# Patient Record
Sex: Female | Born: 1985
Health system: Southern US, Community
[De-identification: ages and names within clinical notes are randomized; demographics above are authoritative.]

## PROBLEM LIST (undated history)

## (undated) ENCOUNTER — Emergency Department (HOSPITAL_COMMUNITY): Admission: EM | Payer: BLUE CROSS/BLUE SHIELD | Source: Home / Self Care

## (undated) DIAGNOSIS — K2980 Duodenitis without bleeding: Secondary | ICD-10-CM

## (undated) DIAGNOSIS — I7300 Raynaud's syndrome without gangrene: Secondary | ICD-10-CM

## (undated) DIAGNOSIS — E282 Polycystic ovarian syndrome: Secondary | ICD-10-CM

## (undated) DIAGNOSIS — R079 Chest pain, unspecified: Secondary | ICD-10-CM

## (undated) DIAGNOSIS — N739 Female pelvic inflammatory disease, unspecified: Secondary | ICD-10-CM

## (undated) DIAGNOSIS — T8859XA Other complications of anesthesia, initial encounter: Secondary | ICD-10-CM

## (undated) DIAGNOSIS — R519 Headache, unspecified: Secondary | ICD-10-CM

## (undated) DIAGNOSIS — Z87442 Personal history of urinary calculi: Secondary | ICD-10-CM

## (undated) DIAGNOSIS — K219 Gastro-esophageal reflux disease without esophagitis: Secondary | ICD-10-CM

## (undated) DIAGNOSIS — L501 Idiopathic urticaria: Secondary | ICD-10-CM

## (undated) DIAGNOSIS — Z9889 Other specified postprocedural states: Secondary | ICD-10-CM

## (undated) DIAGNOSIS — L503 Dermatographic urticaria: Secondary | ICD-10-CM

## (undated) DIAGNOSIS — M26609 Unspecified temporomandibular joint disorder, unspecified side: Secondary | ICD-10-CM

## (undated) DIAGNOSIS — K56609 Unspecified intestinal obstruction, unspecified as to partial versus complete obstruction: Secondary | ICD-10-CM

## (undated) DIAGNOSIS — T4145XA Adverse effect of unspecified anesthetic, initial encounter: Secondary | ICD-10-CM

## (undated) DIAGNOSIS — F909 Attention-deficit hyperactivity disorder, unspecified type: Secondary | ICD-10-CM

## (undated) DIAGNOSIS — R002 Palpitations: Secondary | ICD-10-CM

## (undated) DIAGNOSIS — M1990 Unspecified osteoarthritis, unspecified site: Secondary | ICD-10-CM

## (undated) DIAGNOSIS — F419 Anxiety disorder, unspecified: Secondary | ICD-10-CM

## (undated) DIAGNOSIS — R112 Nausea with vomiting, unspecified: Secondary | ICD-10-CM

## (undated) DIAGNOSIS — T7840XA Allergy, unspecified, initial encounter: Secondary | ICD-10-CM

## (undated) DIAGNOSIS — K639 Disease of intestine, unspecified: Secondary | ICD-10-CM

## (undated) DIAGNOSIS — J189 Pneumonia, unspecified organism: Secondary | ICD-10-CM

## (undated) DIAGNOSIS — B009 Herpesviral infection, unspecified: Secondary | ICD-10-CM

## (undated) DIAGNOSIS — B449 Aspergillosis, unspecified: Secondary | ICD-10-CM

## (undated) HISTORY — PX: NASAL SINUS SURGERY: SHX719

## (undated) HISTORY — DX: Allergy, unspecified, initial encounter: T78.40XA

## (undated) HISTORY — DX: Idiopathic urticaria: L50.1

## (undated) HISTORY — DX: Aspergillosis, unspecified: B44.9

## (undated) HISTORY — DX: Dermatographic urticaria: L50.3

## (undated) HISTORY — DX: Anxiety disorder, unspecified: F41.9

## (undated) HISTORY — DX: Duodenitis without bleeding: K29.80

## (undated) HISTORY — PX: ESOPHAGOGASTRODUODENOSCOPY: SHX1529

## (undated) HISTORY — PX: APPENDECTOMY: SHX54

## (undated) HISTORY — PX: WISDOM TOOTH EXTRACTION: SHX21

## (undated) HISTORY — PX: CERVICAL CERCLAGE: SHX1329

---
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
## 2007-05-20 ENCOUNTER — Emergency Department (HOSPITAL_COMMUNITY): Admission: EM | Admit: 2007-05-20 | Discharge: 2007-05-20 | Payer: Self-pay | Admitting: Emergency Medicine

## 2007-06-02 ENCOUNTER — Ambulatory Visit (HOSPITAL_COMMUNITY): Admission: RE | Admit: 2007-06-02 | Discharge: 2007-06-02 | Payer: Self-pay | Admitting: Obstetrics and Gynecology

## 2007-06-03 ENCOUNTER — Inpatient Hospital Stay (HOSPITAL_COMMUNITY): Admission: AD | Admit: 2007-06-03 | Discharge: 2007-06-08 | Payer: Self-pay | Admitting: Obstetrics and Gynecology

## 2007-07-29 ENCOUNTER — Inpatient Hospital Stay (HOSPITAL_COMMUNITY): Admission: AD | Admit: 2007-07-29 | Discharge: 2007-08-23 | Payer: Self-pay | Admitting: Obstetrics and Gynecology

## 2007-08-22 ENCOUNTER — Encounter (INDEPENDENT_AMBULATORY_CARE_PROVIDER_SITE_OTHER): Payer: Self-pay | Admitting: Obstetrics and Gynecology

## 2007-08-24 ENCOUNTER — Encounter (HOSPITAL_COMMUNITY): Admission: RE | Admit: 2007-08-24 | Discharge: 2007-09-16 | Payer: Self-pay | Admitting: Obstetrics and Gynecology

## 2011-02-05 NOTE — H&P (Signed)
NAMEGIANNINA, [name]              ACCOUNT NO.:  [id number]   MEDICAL RECORD NO.:  [id number]          PATIENT TYPE:  INP   LOCATION:  9399                          FACILITY:  [facility]   PHYSICIAN:  Guy Sandifer. Henderson Cloud, M.D. DATE OF BIRTH:  09-30-1985   DATE OF ADMISSION:  06/03/2007  DATE OF DISCHARGE:                              HISTORY & PHYSICAL   CHIEF COMPLAINT:  Incompetent cervix.   HISTORY OF PRESENT ILLNESS:  The patient is a 25 year old single white  female, G1, P0, who had ultrasound at [facility] on May 20, 2007, confirming an intrauterine pregnancy at 16-2/7 weeks.  Cervical  measurement at that time was 3 cm.  The patient had tonsillectomy and  antibiotic exposures early in pregnancy.  She was referred to Dr. Ander Slade  for consultation and evaluation.  Ultrasound in his office on June 02, 2007, revealed normal anatomy.  However, the cervix measured 0.49 cm.  Today in the office repeat ultrasound confirms funneling of the cervix  with a measurable cervical length of 4 mm.  The patient denies bleeding,  leaking, pelvic pressure.  She has had a productive cough recently.  After discussion with the patient and her mother, she is admitted to  [facility].   PAST MEDICAL HISTORY:  1. Seizure disorder.  She reports a normal MRI and a question as to      the true cause of this.  Her last seizure-like episode was in      March.  2. Tonsillar abscess, status post tonsillectomy under general      anesthesia on February 26, 2007.  3. Levaquin exposure early in pregnancy, status post perinatal      consultation with normal anatomy on ultrasound.   SURGICAL HISTORY:  Tonsillectomy and adenoidectomy as above and knee  surgery.   SOCIAL HISTORY:  Denies tobacco, alcohol or drug abuse.   ALLERGIES:  BIAXIN, which causes her throat to swell; OXYCODONE causes  nausea and vomiting.  She has had rash before with different  PENICILLINS; however, she has tolerated  Keflex with no problems.   FAMILY HISTORY:  Polycystic ovarian syndrome and irritable bowel  syndrome.   REVIEW OF SYSTEMS:  NEUROLOGIC:  See above.  CARDIAC:  Denies chest  pain.  PULMONARY:  Denies shortness of breath.  She does complain of a  productive cough recently.  GI:  Denies recent changes in bowel habits.   PHYSICAL EXAMINATION:  Height 5 feet 4 inches, weight 157 pounds.  Blood  pressure 100/60.  LUNGS:  Clear to auscultation.  HEART:  Regular rate and rhythm.  BREASTS:  Exam deferred.  ABDOMEN:  Nontender.  PELVIC:  Deferred.  EXTREMITIES:  Grossly within normal limits.  NEUROLOGIC:  Grossly within normal limits.   ASSESSMENT:  Cervical incompetence on ultrasound.   PLAN:  Will admit for strict bed rest in Trendelenburg position, IV  fluids, IV antibiotics, with a possible cerclage tomorrow.  Risks and  benefits have been discussed with the patient and her mother.      Guy Sandifer Henderson Cloud, M.D.  Electronically Signed  JET/MEDQ  D:  06/03/2007  T:  06/03/2007  Job:  [id number]

## 2011-02-05 NOTE — Op Note (Signed)
[name], [name]              ACCOUNT NO.:  [id number]   MEDICAL RECORD NO.:  [id number]          PATIENT TYPE:  INP   LOCATION:  9309                          [facility]ILITY:  [facility]   PHYSICIAN:  Guy Sandifer. Henderson Cloud, M.D. DATE OF BIRTH:  1986-04-20   DATE OF PROCEDURE:  06/04/2007  DATE OF DISCHARGE:                               OPERATIVE REPORT   PREOPERATIVE DIAGNOSES:  1. Intrauterine pregnancy at 18-2/7 weeks' estimated additional age.  2. Incompetent cervix.   POSTOPERATIVE DIAGNOSES:  1. Intrauterine pregnancy at 18-2/7 weeks' estimated additional age.  2. Incompetent cervix.   PROCEDURE:  McDonald cervical cerclage.   SURGEON:  Guy Sandifer. Henderson Cloud, M.D.   ANESTHESIA:  Spinal, Cristela Blue, MD   ESTIMATED BLOOD LOSS:  Drops.   INDICATIONS AND CONSENT:  This patient is a 25 year old single white  female, G1 P0, who was diagnosed with pregnancy at 16-2/7 weeks with  ultrasound at [facility], where she complained of abdominal  pains.  Cervix at that time measured 3 cm.  The patient had undergone  tonsillectomy for a tonsillar abscess in the first trimester before she  knew she was pregnant.  She was referred to Dr. Ander Slade for consultation.  Ultrasound there revealed normal fetal anatomy.  However, the cervix had  4.9 mm of cervical length at that time.  The following day she was seen  in my office, where again funneling of the cervix was noted with 4 mm of  measurable cervix.  The patient was admitted to hospital immediately for  Trendelenburg, IV fluids, IV antibiotics.  The following day she is  taken to the operating room.  Cervical cerclage has been discussed with  the patient and her mother.  Potential risks and complications have been  reviewed preoperatively including but not limited to infection,  intrauterine infection, miscarriage, and possible delivery prior to  viability, and organ damage.  All questions were answered and consent is  signed on the chart.   PROCEDURE:  The patient is taken to the operating room where she is  identified, spinal anesthetic is placed, and she is placed in the dorsal  lithotomy position.  She is then gently prepped, bladder straight-  catheterized, and she is draped in a sterile fashion.  Examination  reveals the external cervical os to be dilated to 1 cm.  Examination  with the speculum reveals approximately 1 cm dilation of the external  cervical os with the membranes visualized behind that external cervical  os.  Novofil #1 was then used to put a single pursestring McDonald  suture starting at the 12 o'clock  position and ending at the 1 o'clock position.  The suture is tied down.  Good hemostasis is attained.  A second air knot is placed for easy  location of the cervix later on.  Instruments are removed and all counts  are correct.  The patient is taken to the recovery room in stable  condition.      Guy Sandifer Henderson Cloud, M.D.  Electronically Signed     JET/MEDQ  D:  06/04/2007  T:  06/04/2007  Job:  [id number] 

## 2011-02-08 NOTE — Discharge Summary (Signed)
[name], [name]              ACCOUNT NO.:  [id number]   MEDICAL RECORD NO.:  [id number]          PATIENT TYPE:  INP   LOCATION:  9319                          [facility]ILITY[facility]  [facility]   PHYSICIAN:  Duke Salvia. Marcelle Overlie, M.D.DATE OF BIRTH:  Feb 15, 1986   DATE OF ADMISSION:  07/29/2007  DATE OF DISCHARGE:  08/23/2007                               DISCHARGE SUMMARY   DIAGNOSES:  1. Intrauterine pregnancy at 26-2/7 weeks estimated gestational age.  2. Incompetent cervix status post cervical cerclage.  3. Preterm labor.   DISCHARGE DIAGNOSES:  1. Status post spontaneous vaginal delivery.  2. Viable female infant.   PROCEDURE:  Spontaneous vaginal delivery.   REASON FOR ADMISSION:  Please see written H&P.   HOSPITAL COURSE:  The patient is a 21-year primigravida that was  admitted to [facility] at 26-2/7 weeks estimated  gestational age for complaints of low abdominal pain.  The patient had  had previous cervical cerclage for incompetent cervix which had been  placed in September of this year.  The patient had been on bedrest with  last office visit revealing a cervical length measuring at 9 mm.  On  admission vital signs were stable.  Fetal heart tones were reactive.  Tocometer revealed some uterine irritability.  Cervix exam revealed that  the lower uterine segment was very thin and the stitch was found to be  somewhat loose and cervix was dilated to approximately 1 cm.  The  patient was now hospitalized for dexamethasone, magnesium sulfate and  administration for tocolysis of her labor.  Maternal fetal medicine  consult was made and cervical length was measured by ultrasound.  Normal  amniotic fluid index, normal growth.  Cervix, however was measured with  __a shortened cervical_ length.  The patient was hospitalized over the  next several weeks, where she continued to be monitored closely.  Contractions did seem to resolve with administration of Procardia.  However,  several days later the patient did start to have some increase  in labor and was restarted on the magnesium sulfate.  That was  eventually tapered back to Procardia, however, the patient did  breakthrough and subsequently advance to delivery of a viable female  infant weighing 3 pounds 0 ounces with Apgars of 6 at 1 minute, 8 at 5  minutes, arterial cord pH 7.29.  The NICU was present and baby was taken  to the neonatal intensive care unit in stable condition.  The patient  did sustain a small superficial periurethral laceration which was  repaired.  She was then taken to the recovery room.  Later the following  morning she had been transferred to the mother baby unit.  Vital signs  were stable.  She was afebrile.  Fundus was firm and nontender.  Hemoglobin was 11.9.  On the following morning the patient was doing  well.  She remained afebrile.  Vital signs were stable.  Fundus firm and  nontender.  Discharge instructions reviewed and the patient was later  discharged home.   CONDITION ON DISCHARGE:  Stable.   DIET:  Regular as tolerated.   ACTIVITY:  No vaginal entry for 6 weeks.  She is to call for heavy  vaginal bleeding.   DISCHARGE MEDICATIONS:  Prenatal vitamins one p.o. daily.  Darvocet-N  100 #30 1 p.o. every 4 hours as needed for discomfort.      Julio Sicks, N.P.      Richard M. Marcelle Overlie, M.D.  Electronically Signed    CC/MEDQ  D:  09/23/2007  T:  09/23/2007  Job:  [id number]

## 2011-02-08 NOTE — Discharge Summary (Signed)
[name], [name]              ACCOUNT NO.:  [id number]   MEDICAL RECORD NO.:  [id number]          PATIENT TYPE:  INP   LOCATION:  9309                          [facility]   PHYSICIAN:  Zelphia Cairo, MD    DATE OF BIRTH:  12-19-1985   DATE OF ADMISSION:  06/03/2007  DATE OF DISCHARGE:  06/08/2007                               DISCHARGE SUMMARY   ADMITTING DIAGNOSES:  1. Intrauterine pregnancy at 54 and two-sevenths weeks estimated      gestational age.  2. Incompetent cervix.   DISCHARGE DIAGNOSES:  1. Status post cervical cerclage secondary to incompetent cervix.  2. Intrauterine pregnancy at 18 and five-sevenths weeks.   PROCEDURE:  McDonald cervical cerclage.   REASON FOR ADMISSION:  Please see dictated H&P.   HOSPITAL COURSE:  The patient is a 21-year white single female  primigravida that was transferred to [facility] from [facility].  The patient had had an ultrasound performed at [facility] confirming an intrauterine pregnancy at 40 and two-sevenths  weeks.  At that time cervix was noted to be 3 cm in length.  The patient  had undergone a tonsillectomy and antibiotic exposure early in her  pregnancy.  Due to exposure to Levaquin early in her pregnancy, the  patient had been referred to Dr. Ander Slade for consultation.  The patient had  undergone ultrasound which had revealed normal fetal anatomy.  At that  time cervix was measured to be 4.9 mm in cervical length.  In the  following day the patient was now seen at [facility] where  funneling of the cervix was noted with 4 mm of measurable cervix.  The  patient was now admitted to [facility] immediately for  Trendelenburg positioning, IV fluids and IV antibiotics.  On the  following day the patient was taken to the operating room where a  cervical cerclage was placed.  Malodorous discharge was also noted and  clindamycin was also added to IV antibiotic regimen.  The  patient  tolerated the procedure well and was then taken to the recovery room in  stable condition.  On the following morning, the patient denied any  abdominopelvic pain, only occasional spotting.  Vital signs were stable.  She was afebrile.  Abdomen was nontender.  IV antibiotics continued and  she remained on bedrest.  On the following morning the patient denied  any cramping or vaginal bleeding.  Vital signs were stable.  She was  afebrile.  Abdomen was soft, nontender.  Ultrasound was scheduled for  the following morning and the patient remained on bedrest.  On the  following morning, the patient denied any vaginal bleeding, cramping or  contractions.  Vital signs were stable.  She was afebrile.  Abdomen  continued to be nontender.  Ultrasound revealed cervical length at  approximately 1.19 cm.  The patient did have good support of parents and  it was felt that she was able to remain on bedrest at home.  Decision  was made to discharge the patient and follow up in the office in  approximately  1 week.   CONDITION ON DISCHARGE:  Stable.   DIET:  Regular as tolerated.   ACTIVITY:  Remain on total bedrest with bathroom privileges.   Discharge instructions were given.  The patient to follow up in the  office in approximately 1 week.  She is to call for increasing pelvic  pressure, sudden gush of fluid or vaginal bleeding, or menstrual-like  cramping.   DISCHARGE MEDICATIONS:  Prenatal vitamins one p.o. daily.      Julio Sicks, N.P.      Zelphia Cairo, MD  Electronically Signed    CC/MEDQ  D:  07/01/2007  T:  07/01/2007  Job:  [id number]

## 2011-07-02 LAB — COMPREHENSIVE METABOLIC PANEL
BUN: 4 — ABNORMAL LOW
Calcium: 6.9 — ABNORMAL LOW
Chloride: 107
Creatinine, Ser: 0.6
GFR calc Af Amer: >60
Potassium: 3.7
Total Protein: 5.5 — ABNORMAL LOW

## 2011-07-02 LAB — CBC
HCT: 37.2
HCT: 34.7 — ABNORMAL LOW
Hemoglobin: 11.9 — ABNORMAL LOW
Hemoglobin: 12.9
MCHC: 34.3
MCHC: 34.7
MCHC: 35
MCHC: 35.1
MCV: 91.2
MCV: 91.8
Platelets: 181
Platelets: 202
Platelets: 215
Platelets: 238
RBC: 3.79 — ABNORMAL LOW
RDW: 13.1
RDW: 12.9
RDW: 13
WBC: 19.9 — ABNORMAL HIGH
WBC: 26.3 — ABNORMAL HIGH
WBC: 23.1 — ABNORMAL HIGH

## 2011-07-02 LAB — RPR: RPR Ser Ql: NONREACTIVE

## 2011-07-02 LAB — URINALYSIS, ROUTINE W REFLEX MICROSCOPIC
Bilirubin Urine: NEGATIVE
Specific Gravity, Urine: 1.02
pH: 6

## 2011-07-02 LAB — LACTATE DEHYDROGENASE: LDH: 123

## 2011-07-02 LAB — WET PREP, GENITAL: Trich, Wet Prep: NONE SEEN

## 2011-07-02 LAB — GLUCOSE TOLERANCE, 1 HOUR: Glucose, 1 Hour GTT: 81

## 2011-07-02 LAB — URIC ACID: Uric Acid, Serum: 4

## 2011-07-02 LAB — CULTURE, BETA STREP (GROUP B ONLY)

## 2011-07-02 LAB — FETAL FIBRONECTIN

## 2011-07-05 LAB — COMPREHENSIVE METABOLIC PANEL
ALT: 10
AST: 16
Albumin: 3.2 — ABNORMAL LOW
CO2: 25
GFR calc non Af Amer: >60
Potassium: 3.9
Total Bilirubin: 0.6

## 2011-07-05 LAB — DIFFERENTIAL
Eosinophils Absolute: 0.2
Eosinophils Relative: 1
Lymphs Abs: 2.2
Monocytes Absolute: 0.5

## 2011-07-05 LAB — CBC
HCT: 31.9 — ABNORMAL LOW
HCT: 38.8
HCT: 41.1
Hemoglobin: 14.3
MCHC: 34.8
MCV: 91.5
MCV: 88.8
Platelets: 183
Platelets: 197
Platelets: 199
RDW: 12.6
RDW: 12.7
RDW: 12
WBC: 12.2 — ABNORMAL HIGH
WBC: 16 — ABNORMAL HIGH

## 2011-07-05 LAB — URINALYSIS, ROUTINE W REFLEX MICROSCOPIC
Leukocytes, UA: NEGATIVE
Nitrite: NEGATIVE
Protein, ur: NEGATIVE
Specific Gravity, Urine: >1.03 — ABNORMAL HIGH
Urobilinogen, UA: 0.2

## 2011-07-05 LAB — URINE MICROSCOPIC-ADD ON

## 2011-07-05 LAB — MAGNESIUM: Magnesium: 2

## 2013-09-23 DIAGNOSIS — T8859XA Other complications of anesthesia, initial encounter: Secondary | ICD-10-CM

## 2013-09-23 HISTORY — DX: Other complications of anesthesia, initial encounter: T88.59XA

## 2014-05-02 DIAGNOSIS — J329 Chronic sinusitis, unspecified: Secondary | ICD-10-CM | POA: Insufficient documentation

## 2014-05-02 DIAGNOSIS — J309 Allergic rhinitis, unspecified: Secondary | ICD-10-CM | POA: Insufficient documentation

## 2015-03-14 ENCOUNTER — Telehealth: Payer: Self-pay

## 2015-03-23 ENCOUNTER — Encounter (INDEPENDENT_AMBULATORY_CARE_PROVIDER_SITE_OTHER): Payer: Self-pay

## 2015-03-23 ENCOUNTER — Ambulatory Visit (INDEPENDENT_AMBULATORY_CARE_PROVIDER_SITE_OTHER): Payer: 59 | Admitting: Internal Medicine

## 2015-03-23 ENCOUNTER — Ambulatory Visit (INDEPENDENT_AMBULATORY_CARE_PROVIDER_SITE_OTHER)
Admission: RE | Admit: 2015-03-23 | Discharge: 2015-03-23 | Disposition: A | Discharge status: Home / Self Care | Payer: 59 | Source: Ambulatory Visit | Attending: Internal Medicine | Admitting: Internal Medicine

## 2015-03-23 ENCOUNTER — Encounter: Payer: Self-pay | Admitting: Internal Medicine

## 2015-03-23 VITALS — BP 140/86 | HR 87 | Wt 187.0 lb

## 2015-03-23 DIAGNOSIS — R05 Cough: Secondary | ICD-10-CM | POA: Diagnosis not present

## 2015-03-23 DIAGNOSIS — R059 Cough, unspecified: Secondary | ICD-10-CM

## 2015-03-23 MED ORDER — MOMETASONE FUROATE 100 MCG/ACT IN AERO: 2.0000 | INHALATION_SPRAY | Freq: Two times a day (BID) | RESPIRATORY_TRACT | Status: DC

## 2015-03-23 NOTE — Assessment & Plan Note (Signed)
The most common causes of chronic cough in immunocompetent adults include the following: upper airway cough syndrome (UACS), previously referred to as postnasal drip syndrome (PNDS), which is caused by variety of rhinosinus conditions; (2) asthma; (3) GERD; (4) chronic bronchitis from cigarette smoking or other inhaled environmental irritants; (5) nonasthmatic eosinophilic bronchitis; and (6) bronchiectasis.   These conditions, singly or in combination, have accounted for up to 94% of the causes of chronic cough in prospective studies.   Other conditions have constituted no >6% of the causes in prospective studies These have included bronchogenic carcinoma, chronic interstitial pneumonia, sarcoidosis, left ventricular failure, ACEI-induced cough, and aspiration from a condition associated with pharyngeal dysfunction.    Chronic cough is often simultaneously caused by more than one condition. A single cause has been found from 38 to 82% of the time, multiple causes from 18 to 62%. Multiply caused cough has been the result of three diseases up to 42% of the time.      I suspect this cough is multifactorial but note it is partially steroid responsive and worth an empirical trial for cough variant asthma/ eos bronchitis with ISc  I had an extended discussion with the patient reviewing all relevant studies completed to date and  lasting 35 m   The proper method of use, as well as anticipated side effects, of a metered-dose inhaler are discussed and demonstrated to the patient. Improved effectiveness after extensive coaching during this visit to a level of approximately  75% try asmanex and continue singulair / max antihistamines which should help with pnds as well.    Each maintenance medication was reviewed in detail including most importantly the difference between maintenance and prns and under what circumstances the prns are to be triggered using an action plan format that is not reflected in the  computer generated alphabetically organized AVS.    Please see instructions for details which were reviewed in writing and the patient given a copy highlighting the part that I personally wrote and discussed at today's ov.

## 2015-03-23 NOTE — Telephone Encounter (Signed)
Pt scheduled for today with MW.  Nothing further needed.

## 2015-03-23 NOTE — Progress Notes (Signed)
   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 Pulmonary Consult    Referred by Dr. Cherly Hensenousins.  Pt c/o cough since Jan 2016- prod with white to yellow sputum.    last on prednisone one month prior to OV  Helped some with cough esp hs but now coughing 24 h per day on an off  [place] great at beach   No obvious other  patterns in  day to day or daytime variabilty or assoc sob or  cp or chest tightness, subjective wheeze overt sinus or hb symptoms. No unusual exp hx or h/o childhood pna/ asthma or knowledge of premature birth.  Also denies any obvious fluctuation of symptoms with weather or environmental changes or other aggravating or alleviating factors except as outlined above   Current Medications, Allergies, Complete Past Medical History, Past Surgical History, Family History, and Social History were reviewed in [facility] electronic medical record.       Review of Systems  Constitutional: Negative for fever, chills and unexpected weight change.  HENT: Positive for sneezing. Negative for congestion, dental problem, ear pain, nosebleeds, postnasal drip, rhinorrhea, sinus pressure, sore throat, trouble swallowing and voice change.   Eyes: Negative for visual disturbance.  Respiratory: Positive for cough. Negative for choking and shortness of breath.   Cardiovascular: Negative for chest pain and leg swelling.  Gastrointestinal: Negative for vomiting, abdominal pain and diarrhea.  Genitourinary: Negative for difficulty urinating.  Musculoskeletal:  Positive for arthralgias.  Skin: Negative for rash.  Neurological: Negative for tremors, syncope and headaches.  Hematological: Does not bruise/bleed easily.       Objective:   Physical Exam  amb wf nad   Wt  187    Vital signs reviewed   HEENT: nl dentition, turbinates, and orophanx. Nl external ear canals without cough reflex   NECK :  without JVD/Nodes/TM/ nl carotid upstrokes bilaterally   LUNGS: no acc muscle use, clear to A and P bilaterally without reproducible cough on insp or exp maneuvers   CV:  RRR  no s3 or murmur or increase in P2, no edema   ABD:  soft and nontender with nl excursion in the supine position. No bruits or organomegaly, bowel sounds nl  MS:  warm without deformities, calf tenderness, cyanosis or clubbing  SKIN: warm and dry without lesions    NEURO:  alert, approp, no deficits   CXR PA and Lateral:   03/23/2015 :     I personally reviewed images and agree with radiology impression as follows:    No acute abnormality seen      Assessment & Plan:

## 2015-03-23 NOTE — Patient Instructions (Addendum)
Please remember to go to the  x-ray department downstairs for your tests - we will call you with the results when they are available.  asmanex Take 2 puffs first thing in am and then another 2 puffs about 12 hours later.   Zantac should be 150 mg after bfast and after supper/ bedtime   GERD (REFLUX)  is an extremely common cause of respiratory symptoms just like yours , many times with no obvious heartburn at all.    It can be treated with medication, but also with lifestyle changes including elevation of the head of your bed (ideally with 6 inch  bed blocks),  Smoking cessation, avoidance of late meals, excessive alcohol, and avoid fatty foods, chocolate, peppermint, colas, red wine, and acidic juices such as orange juice.  NO MINT OR MENTHOL PRODUCTS SO NO COUGH DROPS  USE SUGARLESS CANDY INSTEAD (Jolley ranchers or Stover's or Life Savers) or even ice chips will also do - the key is to swallow to prevent all throat clearing. NO OIL BASED VITAMINS - use powdered substitutes.    Please schedule a follow up office visit in 4 weeks, sooner if needed

## 2015-03-24 ENCOUNTER — Encounter: Payer: Self-pay | Admitting: Internal Medicine

## 2015-03-26 ENCOUNTER — Encounter: Payer: Self-pay | Admitting: Internal Medicine

## 2015-04-20 ENCOUNTER — Ambulatory Visit: Payer: 59 | Admitting: Internal Medicine

## 2015-10-08 ENCOUNTER — Emergency Department (HOSPITAL_COMMUNITY): Payer: 59

## 2015-10-08 ENCOUNTER — Encounter (HOSPITAL_COMMUNITY): Payer: Self-pay | Admitting: Emergency Medicine

## 2015-10-08 ENCOUNTER — Emergency Department (HOSPITAL_COMMUNITY)
Admission: EM | Admit: 2015-10-08 | Discharge: 2015-10-08 | Disposition: A | Discharge status: Home / Self Care | Payer: 59 | Attending: Emergency Medicine | Admitting: Emergency Medicine

## 2015-10-08 DIAGNOSIS — N938 Other specified abnormal uterine and vaginal bleeding: Secondary | ICD-10-CM | POA: Insufficient documentation

## 2015-10-08 DIAGNOSIS — Z3202 Encounter for pregnancy test, result negative: Secondary | ICD-10-CM | POA: Insufficient documentation

## 2015-10-08 DIAGNOSIS — Z7952 Long term (current) use of systemic steroids: Secondary | ICD-10-CM | POA: Insufficient documentation

## 2015-10-08 DIAGNOSIS — Z79899 Other long term (current) drug therapy: Secondary | ICD-10-CM | POA: Diagnosis not present

## 2015-10-08 DIAGNOSIS — R102 Pelvic and perineal pain: Secondary | ICD-10-CM | POA: Diagnosis not present

## 2015-10-08 DIAGNOSIS — Z880 Allergy status to penicillin: Secondary | ICD-10-CM | POA: Diagnosis not present

## 2015-10-08 DIAGNOSIS — R1032 Left lower quadrant pain: Secondary | ICD-10-CM | POA: Diagnosis not present

## 2015-10-08 LAB — CBC WITH DIFFERENTIAL/PLATELET
Basophils Absolute: 0.1 10*3/uL (ref 0.0–0.1)
Basophils Relative: 1 %
EOS ABS: 0.3 10*3/uL (ref 0.0–0.7)
Eosinophils Relative: 3 %
HEMATOCRIT: 38.5 % (ref 36.0–46.0)
HEMOGLOBIN: 13.2 g/dL (ref 12.0–15.0)
LYMPHS ABS: 2.5 10*3/uL (ref 0.7–4.0)
LYMPHS PCT: 29 %
MCH: 29.6 pg (ref 26.0–34.0)
MCHC: 34.3 g/dL (ref 30.0–36.0)
MCV: 86.3 fL (ref 78.0–100.0)
Monocytes Absolute: 0.4 10*3/uL (ref 0.1–1.0)
Monocytes Relative: 5 %
NEUTROS ABS: 5.6 10*3/uL (ref 1.7–7.7)
NEUTROS PCT: 62 %
Platelets: 227 10*3/uL (ref 150–400)
RBC: 4.46 MIL/uL (ref 3.87–5.11)
RDW: 13.1 % (ref 11.5–15.5)
WBC: 8.9 10*3/uL (ref 4.0–10.5)

## 2015-10-08 LAB — URINE MICROSCOPIC-ADD ON
Bacteria, UA: NONE SEEN
RBC / HPF: NONE SEEN RBC/hpf (ref 0–5)

## 2015-10-08 LAB — URINALYSIS, ROUTINE W REFLEX MICROSCOPIC
Bilirubin Urine: NEGATIVE
GLUCOSE, UA: NEGATIVE mg/dL
KETONES UR: NEGATIVE mg/dL
LEUKOCYTES UA: NEGATIVE
NITRITE: NEGATIVE
PH: 5.5 (ref 5.0–8.0)
Protein, ur: NEGATIVE mg/dL
SPECIFIC GRAVITY, URINE: 1.029 (ref 1.005–1.030)

## 2015-10-08 LAB — POC URINE PREG, ED: Preg Test, Ur: NEGATIVE

## 2015-10-08 MED ORDER — KETOROLAC TROMETHAMINE 60 MG/2ML IM SOLN
30.0000 mg | Freq: Once | INTRAMUSCULAR | Status: AC
  Administered 2015-10-08: 30 mg via INTRAMUSCULAR
  Filled 2015-10-08: qty 2

## 2015-10-08 NOTE — ED Notes (Signed)
Pt c/o left ovary cyst pain x 3 days. Pt reports that she had one recently that resolved, but this one is more painful.

## 2015-10-08 NOTE — Discharge Instructions (Signed)
Make sure you are no having constipation or hard stools. That can cause pain in the left lower abdomen. Drink plenty of fluids. Return if you develop persistent vomiting, worsening pain, fever or other problems.   Pelvic Pain, Female Pelvic pain is pain felt below the belly button and between your hips. It can be caused by many different things. It is important to get help right away. This is especially true for severe, sharp, or unusual pain that comes on suddenly.  HOME CARE  Only take medicine as told by your doctor.  Rest as told by your doctor.  Eat a healthy diet, such as fruits, vegetables, and lean meats.  Drink enough fluids to keep your pee (urine) clear or pale yellow, or as told.  Avoid sex (intercourse) if it causes pain.  Apply warm or cold packs to your lower belly (abdomen). Use the type of pack that helps the pain.  Avoid situations that cause you stress.  Keep a journal to track your pain. Write down:  When the pain started.  Where it is located.  If there are things that seem to be related to the pain, such as food or your period.  Follow up with your doctor as told. GET HELP RIGHT AWAY IF:   You have heavy bleeding from the vagina.  You have more pelvic pain.  You feel lightheaded or pass out (faint).  You have chills.  You have pain when you pee or have blood in your pee.  You cannot stop having watery poop (diarrhea).  You cannot stop throwing up (vomiting).  You have a fever or lasting symptoms for more than 3 days.  You have a fever and your symptoms suddenly get worse.  You are being physically or sexually abused.  Your medicine does not help your pain.  You have fluid (discharge) coming from your vagina that is not normal. MAKE SURE YOU:  Understand these instructions.  Will watch your condition.  Will get help if you are not doing well or get worse.   This information is not intended to replace advice given to you by your health  care provider. Make sure you discuss any questions you have with your health care provider.   Document Released: 02/26/2008 Document Revised: 09/30/2014 Document Reviewed: 12/30/2011 Elsevier Interactive Patient Education [employer]2016 [employer] Inc.

## 2015-10-08 NOTE — ED Provider Notes (Signed)
CSN: [id number]     Arrival date & time 10/08/15  1323 History   First MD Initiated Contact with Patient 10/08/15 1348     Chief Complaint  Patient presents with  . Cyst     (Consider location/radiation/quality/duration/timing/severity/associated sxs/prior Treatment) Patient is a 30 y.o. female presenting with pelvic pain. The history is provided by the patient.  Pelvic Pain This is a new problem. The current episode started in the past 7 days. The problem occurs constantly. The problem has been gradually worsening.   PARALEE PENDERGRASS is a 30 y.o. G1 P1 with IUD for birth control presents to the ED with left lower abdominal pain that started 3 days ago. She has a hx of ovarian cysts.  She reports this feeling similar to other cyst but worse. She rates her pain as 8/10. She took ibuprofen 800 mg without relief. Last dose 9 am. 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 Patient not concerned about STD's as she has only one partner (her husband).   Past Medical History  Diagnosis Date  . Aspergillosis (HCC)     sinus   Past Surgical History  Procedure Laterality Date  . Knee surgery Right 2007  . Tonsillectomy  2008   Family History  Problem Relation Age of Onset  . Allergies Sister   . Allergies Mother   . Allergies Father   . Asthma Mother    Social History  Substance Use Topics  . Smoking status: Never Smoker   . Smokeless tobacco: Never Used  . Alcohol Use: No   OB History    No data available     Review of Systems  Genitourinary: Positive for vaginal bleeding and pelvic pain. Negative for dysuria, frequency, hematuria, flank pain and decreased urine volume.  all other systems negative    Allergies  Penicillins  Home Medications   Prior to Admission medications   Medication Sig Start Date End Date Taking? Authorizing Provider  clemastine (TAVIST) 2.68 MG  TABS tablet Take 2.68 mg by mouth daily.    Historical Provider, MD  fexofenadine (ALLEGRA) 180 MG tablet Take 180 mg by mouth 2 (two) times daily.    Historical Provider, MD  folic acid (FOLVITE) 1 MG tablet Take 1 mg by mouth daily.    Historical Provider, MD  hydrOXYzine (ATARAX/VISTARIL) 25 MG tablet 8 tablets at bedtime    Historical Provider, MD  ibuprofen (ADVIL,MOTRIN) 800 MG tablet Take 800 mg by mouth every 8 (eight) hours as needed.    Historical Provider, MD  itraconazole (SPORANOX) 100 MG capsule Take 100 mg by mouth 2 (two) times daily.    Historical Provider, MD  Mometasone Furoate [facility] HFA) 100 MCG/ACT AERO Inhale 2 puffs into the lungs 2 (two) times daily. 03/23/15   Nyoka Cowden, MD  montelukast (SINGULAIR) 10 MG tablet Take 10 mg by mouth at bedtime.    Historical Provider, MD  predniSONE (DELTASONE) 10 MG tablet Take 10 mg by mouth daily with breakfast.    Historical Provider, MD  ranitidine (ZANTAC) 300 MG capsule Take 300 mg by mouth 2 (two) times daily.    Historical Provider, MD  Vitamin D, Ergocalciferol, (DRISDOL) 50000 UNITS CAPS capsule Take 50,000 Units by mouth 2 (two) times a week.    Historical Provider, MD   BP 127/64 mmHg  Pulse 92  Temp(Src) 98.4 F (  36.9 C) (Oral)  Resp 18  Ht 5\' 4"  (1.626 m)  Wt 87.629 kg  BMI 33.14 kg/m2  SpO2 99%  LMP 10/08/2015 Physical Exam  Constitutional: She is oriented to person, place, and time. She appears well-developed and well-nourished. No distress.  HENT:  Head: Normocephalic and atraumatic.  Eyes: Conjunctivae and EOM are normal.  Neck: Normal range of motion. Neck supple.  Cardiovascular: Normal rate.   Pulmonary/Chest: Effort normal.  Abdominal: Soft. Bowel sounds are normal. There is tenderness in the left lower quadrant. There is no rigidity, no rebound, no guarding and no CVA tenderness.  Genitourinary:  Patient does not feel the need for pelvic exam as she sees Dr. Cherly Hensen on a regular basis and patient  not concerned about STI's.   Musculoskeletal: Normal range of motion.  Neurological: She is alert and oriented to person, place, and time. No cranial nerve deficit.  Skin: Skin is warm and dry.  Psychiatric: She has a normal mood and affect. Her behavior is normal.  Nursing note and vitals reviewed.   ED Course  Procedures (including critical care time) Labs Review Results for orders placed or performed during the hospital encounter of 10/08/15 (from the past 24 hour(s))  Urinalysis, Routine w reflex microscopic     Status: Abnormal   Collection Time: 10/08/15  2:04 PM  Result Value Ref Range   Color, Urine YELLOW YELLOW   APPearance CLEAR CLEAR   Specific Gravity, Urine 1.029 1.005 - 1.030   pH 5.5 5.0 - 8.0   Glucose, UA NEGATIVE NEGATIVE mg/dL   Hgb urine dipstick TRACE (A) NEGATIVE   Bilirubin Urine NEGATIVE NEGATIVE   Ketones, ur NEGATIVE NEGATIVE mg/dL   Protein, ur NEGATIVE NEGATIVE mg/dL   Nitrite NEGATIVE NEGATIVE   Leukocytes, UA NEGATIVE NEGATIVE  Urine microscopic-add on     Status: Abnormal   Collection Time: 10/08/15  2:04 PM  Result Value Ref Range   Squamous Epithelial / LPF 6-30 (A) NONE SEEN   WBC, UA 0-5 0 - 5 WBC/hpf   RBC / HPF NONE SEEN 0 - 5 RBC/hpf   Bacteria, UA NONE SEEN NONE SEEN   Casts WBC CAST (A) NEGATIVE  POC urine preg, ED (not at [facility])     Status: None   Collection Time: 10/08/15  2:12 PM  Result Value Ref Range   Preg Test, Ur NEGATIVE NEGATIVE  CBC with Differential/Platelet     Status: None   Collection Time: 10/08/15  2:52 PM  Result Value Ref Range   WBC 8.9 4.0 - 10.5 K/uL   RBC 4.46 3.87 - 5.11 MIL/uL   Hemoglobin 13.2 12.0 - 15.0 g/dL   HCT 14.7 82.9 - 56.2 %   MCV 86.3 78.0 - 100.0 fL   MCH 29.6 26.0 - 34.0 pg   MCHC 34.3 30.0 - 36.0 g/dL   RDW 13.0 86.5 - 78.4 %   Platelets 227 150 - 400 K/uL   Neutrophils Relative % 62 %   Neutro Abs 5.6 1.7 - 7.7 K/uL   Lymphocytes Relative 29 %   Lymphs Abs 2.5 0.7 - 4.0 K/uL    Monocytes Relative 5 %   Monocytes Absolute 0.4 0.1 - 1.0 K/uL   Eosinophils Relative 3 %   Eosinophils Absolute 0.3 0.0 - 0.7 K/uL   Basophils Relative 1 %   Basophils Absolute 0.1 0.0 - 0.1 K/uL    Imaging Review US Transvaginal Non-ob  10/08/2015  CLINICAL DATA:  Pelvic pain in female. Left  lower quadrant pain for 2 days. LMP 10/04/2015. EXAM: TRANSABDOMINAL AND TRANSVAGINAL ULTRASOUND OF PELVIS TECHNIQUE: Both transabdominal and transvaginal ultrasound examinations of the pelvis were performed. Transabdominal technique was performed for global imaging of the pelvis including uterus, ovaries, adnexal regions, and pelvic cul-de-sac. It was necessary to proceed with endovaginal exam following the transabdominal exam to visualize the IUD and ovaries. COMPARISON:  None FINDINGS: Uterus Measurements: 7.8 x 4.4 x 5.3 cm. Retroverted. No fibroids identified. Several cervical nabothian cysts noted. Endometrium Thickness: IUD seen within the endometrial cavity. This obscures visualization of endometrium and endometrial thickness. Right ovary Measurements: 3.9 x 2.5 x 2.6 cm. Normal appearance/no adnexal mass. Left ovary Measurements: 3.6 x 2.2 x 2.8 cm. Normal appearance/no adnexal mass. Other findings No abnormal free fluid. IMPRESSION: Retroverted uterus with IUD visualized within endometrial cavity. No evidence of fibroids. Normal appearance of both ovaries.  No adnexal mass identified. Electronically Signed   By: Myles RosenthalJohn  Stahl M.D.   On: 10/08/2015 15:47   [place] Pelvis Complete  10/08/2015  CLINICAL DATA:  Pelvic pain in female. Left lower quadrant pain for 2 days. LMP 10/04/2015. EXAM: TRANSABDOMINAL AND TRANSVAGINAL ULTRASOUND OF PELVIS TECHNIQUE: Both transabdominal and transvaginal ultrasound examinations of the pelvis were performed. Transabdominal technique was performed for global imaging of the pelvis including uterus, ovaries, adnexal regions, and pelvic cul-de-sac. It was necessary to proceed with  endovaginal exam following the transabdominal exam to visualize the IUD and ovaries. COMPARISON:  None FINDINGS: Uterus Measurements: 7.8 x 4.4 x 5.3 cm. Retroverted. No fibroids identified. Several cervical nabothian cysts noted. Endometrium Thickness: IUD seen within the endometrial cavity. This obscures visualization of endometrium and endometrial thickness. Right ovary Measurements: 3.9 x 2.5 x 2.6 cm. Normal appearance/no adnexal mass. Left ovary Measurements: 3.6 x 2.2 x 2.8 cm. Normal appearance/no adnexal mass. Other findings No abnormal free fluid. IMPRESSION: Retroverted uterus with IUD visualized within endometrial cavity. No evidence of fibroids. Normal appearance of both ovaries.  No adnexal mass identified. Electronically Signed   By: Myles RosenthalJohn  Stahl M.D.   On: 10/08/2015 15:47   I have personally reviewed and evaluated these images and lab results as part of my medical decision-making.  Dr. Criss AlvineGoldston in to examine the patient. Discussed CT scan vs observing and f/u with GYN. Patient states she has had a lot of CT's and will wait and follow up with her GYN or return if symptoms worsen.   MDM  30 y.o. female with hx of ovarian cysts and pain today in the LLQ that feels similar to cysts in the past stable for d/c with normal labs and ultrasound. No cyst identified, no torsion, IUD placement correct. Discussed with the patient and all questioned fully answered. She will return if any problems arise.   Final diagnoses:  Pelvic pain in female       Janne NapoleonHope M Neese, [place]NP 10/08/15 1616  Pricilla LovelessScott Goldston, MD 10/09/15 1555

## 2015-10-17 DIAGNOSIS — R0689 Other abnormalities of breathing: Secondary | ICD-10-CM | POA: Diagnosis not present

## 2015-10-17 DIAGNOSIS — R400 Somnolence: Secondary | ICD-10-CM | POA: Diagnosis not present

## 2015-10-17 DIAGNOSIS — R51 Headache: Secondary | ICD-10-CM | POA: Diagnosis not present

## 2015-10-17 DIAGNOSIS — G4733 Obstructive sleep apnea (adult) (pediatric): Secondary | ICD-10-CM | POA: Diagnosis not present

## 2015-10-17 DIAGNOSIS — R5382 Chronic fatigue, unspecified: Secondary | ICD-10-CM | POA: Diagnosis not present

## 2015-10-17 DIAGNOSIS — E669 Obesity, unspecified: Secondary | ICD-10-CM | POA: Diagnosis not present

## 2015-10-17 DIAGNOSIS — R0683 Snoring: Secondary | ICD-10-CM | POA: Diagnosis not present

## 2015-10-17 DIAGNOSIS — G4700 Insomnia, unspecified: Secondary | ICD-10-CM | POA: Diagnosis not present

## 2015-10-17 DIAGNOSIS — Z820 Family history of epilepsy and other diseases of the nervous system: Secondary | ICD-10-CM | POA: Diagnosis not present

## 2015-10-17 DIAGNOSIS — G4761 Periodic limb movement disorder: Secondary | ICD-10-CM | POA: Diagnosis not present

## 2015-10-18 DIAGNOSIS — L508 Other urticaria: Secondary | ICD-10-CM | POA: Diagnosis not present

## 2015-10-18 DIAGNOSIS — J324 Chronic pansinusitis: Secondary | ICD-10-CM | POA: Diagnosis not present

## 2015-10-18 DIAGNOSIS — R0602 Shortness of breath: Secondary | ICD-10-CM | POA: Diagnosis not present

## 2015-11-28 DIAGNOSIS — E669 Obesity, unspecified: Secondary | ICD-10-CM | POA: Diagnosis not present

## 2015-11-28 DIAGNOSIS — T7840XS Allergy, unspecified, sequela: Secondary | ICD-10-CM | POA: Diagnosis not present

## 2015-11-28 DIAGNOSIS — L508 Other urticaria: Secondary | ICD-10-CM | POA: Diagnosis not present

## 2015-11-28 DIAGNOSIS — E282 Polycystic ovarian syndrome: Secondary | ICD-10-CM | POA: Diagnosis not present

## 2015-11-29 DIAGNOSIS — J00 Acute nasopharyngitis [common cold]: Secondary | ICD-10-CM | POA: Diagnosis not present

## 2015-11-29 DIAGNOSIS — R05 Cough: Secondary | ICD-10-CM | POA: Diagnosis not present

## 2015-12-20 DIAGNOSIS — L501 Idiopathic urticaria: Secondary | ICD-10-CM | POA: Diagnosis not present

## 2016-01-17 DIAGNOSIS — L501 Idiopathic urticaria: Secondary | ICD-10-CM | POA: Diagnosis not present

## 2016-02-14 DIAGNOSIS — L501 Idiopathic urticaria: Secondary | ICD-10-CM | POA: Diagnosis not present

## 2016-02-14 DIAGNOSIS — J329 Chronic sinusitis, unspecified: Secondary | ICD-10-CM | POA: Diagnosis not present

## 2016-02-25 ENCOUNTER — Emergency Department (HOSPITAL_COMMUNITY): Payer: 59

## 2016-02-25 ENCOUNTER — Emergency Department (HOSPITAL_COMMUNITY)
Admission: EM | Admit: 2016-02-25 | Discharge: 2016-02-25 | Disposition: A | Discharge status: Home / Self Care | Payer: 59 | Attending: Emergency Medicine | Admitting: Emergency Medicine

## 2016-02-25 ENCOUNTER — Encounter (HOSPITAL_COMMUNITY): Payer: Self-pay | Admitting: Nurse Practitioner

## 2016-02-25 DIAGNOSIS — R000 Tachycardia, unspecified: Secondary | ICD-10-CM | POA: Diagnosis present

## 2016-02-25 DIAGNOSIS — R110 Nausea: Secondary | ICD-10-CM | POA: Insufficient documentation

## 2016-02-25 DIAGNOSIS — Z880 Allergy status to penicillin: Secondary | ICD-10-CM | POA: Insufficient documentation

## 2016-02-25 DIAGNOSIS — Z7952 Long term (current) use of systemic steroids: Secondary | ICD-10-CM | POA: Insufficient documentation

## 2016-02-25 DIAGNOSIS — Z79899 Other long term (current) drug therapy: Secondary | ICD-10-CM | POA: Diagnosis not present

## 2016-02-25 DIAGNOSIS — Z8619 Personal history of other infectious and parasitic diseases: Secondary | ICD-10-CM | POA: Insufficient documentation

## 2016-02-25 DIAGNOSIS — Z872 Personal history of diseases of the skin and subcutaneous tissue: Secondary | ICD-10-CM | POA: Insufficient documentation

## 2016-02-25 DIAGNOSIS — R002 Palpitations: Secondary | ICD-10-CM | POA: Diagnosis not present

## 2016-02-25 DIAGNOSIS — R42 Dizziness and giddiness: Secondary | ICD-10-CM | POA: Insufficient documentation

## 2016-02-25 DIAGNOSIS — R61 Generalized hyperhidrosis: Secondary | ICD-10-CM | POA: Diagnosis not present

## 2016-02-25 DIAGNOSIS — Z3202 Encounter for pregnancy test, result negative: Secondary | ICD-10-CM | POA: Diagnosis not present

## 2016-02-25 LAB — CBC
HEMATOCRIT: 41.9 % (ref 36.0–46.0)
Hemoglobin: 14.2 g/dL (ref 12.0–15.0)
MCH: 29.2 pg (ref 26.0–34.0)
MCHC: 33.9 g/dL (ref 30.0–36.0)
MCV: 86 fL (ref 78.0–100.0)
PLATELETS: 235 10*3/uL (ref 150–400)
RBC: 4.87 MIL/uL (ref 3.87–5.11)
RDW: 12.9 % (ref 11.5–15.5)
WBC: 13.6 10*3/uL — AB (ref 4.0–10.5)

## 2016-02-25 LAB — BASIC METABOLIC PANEL
Anion gap: 8 (ref 5–15)
BUN: 10 mg/dL (ref 6–20)
CALCIUM: 8.9 mg/dL (ref 8.9–10.3)
CO2: 24 mmol/L (ref 22–32)
Chloride: 106 mmol/L (ref 101–111)
Creatinine, Ser: 1.01 mg/dL — ABNORMAL HIGH (ref 0.44–1.00)
GFR calc Af Amer: >60 mL/min (ref >60)
GLUCOSE: 119 mg/dL — AB (ref 65–99)
POTASSIUM: 3.7 mmol/L (ref 3.5–5.1)
SODIUM: 138 mmol/L (ref 135–145)

## 2016-02-25 LAB — URINALYSIS, ROUTINE W REFLEX MICROSCOPIC
BILIRUBIN URINE: NEGATIVE
Glucose, UA: NEGATIVE mg/dL
KETONES UR: NEGATIVE mg/dL
LEUKOCYTES UA: NEGATIVE
NITRITE: NEGATIVE
PROTEIN: NEGATIVE mg/dL
Specific Gravity, Urine: 1.035 — ABNORMAL HIGH (ref 1.005–1.030)
pH: 5.5 (ref 5.0–8.0)

## 2016-02-25 LAB — URINE MICROSCOPIC-ADD ON

## 2016-02-25 LAB — TSH: TSH: 1.394 u[IU]/mL (ref 0.350–4.500)

## 2016-02-25 LAB — MAGNESIUM: Magnesium: 2.2 mg/dL (ref 1.7–2.4)

## 2016-02-25 LAB — I-STAT BETA HCG BLOOD, ED (MC, WL, AP ONLY)

## 2016-02-25 LAB — I-STAT TROPONIN, ED: Troponin i, poc: 0 ng/mL (ref 0.00–0.08)

## 2016-02-25 LAB — T4, FREE: FREE T4: 1.07 ng/dL (ref 0.61–1.12)

## 2016-02-25 LAB — CBG MONITORING, ED: GLUCOSE-CAPILLARY: 127 mg/dL — AB (ref 65–99)

## 2016-02-25 MED ORDER — SODIUM CHLORIDE 0.9 % IV BOLUS (SEPSIS)
500.0000 mL | Freq: Once | INTRAVENOUS | Status: AC
  Administered 2016-02-25: 500 mL via INTRAVENOUS

## 2016-02-25 MED ORDER — ACETAMINOPHEN 500 MG PO TABS
1000.0000 mg | ORAL_TABLET | Freq: Once | ORAL | Status: AC
  Administered 2016-02-25: 1000 mg via ORAL
  Filled 2016-02-25: qty 2

## 2016-02-25 NOTE — ED Provider Notes (Signed)
CSN: [id number]     Arrival date & time 02/25/16  1402 History   None    Chief Complaint  Patient presents with  . Tachycardia   HPI Comments: 30 year old female with past medical history of chronic autoimmune urticaria, aspergillosis of the sinuses presents to the ED after an episode of palpitations associated with nausea, lightheadedness, and clamminess.  Patient is a 30 y.o. female presenting with palpitations. The history is provided by the patient.  Palpitations Palpitations quality:  Fast Onset quality:  Sudden Timing:  Constant Chronicity:  New Context: not hyperventilation, not illicit drugs and not nicotine   Relieved by:  None tried Worsened by:  Nothing Ineffective treatments:  None tried Associated symptoms: diaphoresis and nausea   Associated symptoms: no chest pain, no chest pressure, no cough, no leg pain, no lower extremity edema, no shortness of breath, no syncope and no vomiting   Nausea:    Severity:  Mild   Onset quality:  Sudden   Nausea duration: Just PTA.   Timing:  Constant   Progression:  Resolved Risk factors: no hx of DVT, no hx of PE, no hx of thyroid disease and no hypercoagulable state     Past Medical History  Diagnosis Date  . Aspergillosis (HCC)     sinus   Past Surgical History  Procedure Laterality Date  . Knee surgery Right 2007  . Tonsillectomy  2008  . Nasal sinus surgery     Family History  Problem Relation Age of Onset  . Allergies Sister   . Allergies Mother   . Allergies Father   . Asthma Mother    Social History  Substance Use Topics  . Smoking status: Never Smoker   . Smokeless tobacco: Never Used  . Alcohol Use: No   OB History    No data available     Review of Systems  Constitutional: Positive for diaphoresis. Negative for fever and chills.  HENT: Negative for congestion.   Eyes: Negative for redness.  Respiratory: Negative for cough and shortness of breath.   Cardiovascular: Positive for palpitations.  Negative for chest pain and syncope.  Gastrointestinal: Positive for nausea. Negative for vomiting.  Genitourinary: Negative for decreased urine volume.  Musculoskeletal: Negative for gait problem.  Skin: Negative for rash.  Neurological: Positive for light-headedness. Negative for syncope.  Psychiatric/Behavioral: Negative for confusion.      Allergies  Penicillins  Home Medications   Prior to Admission medications   Medication Sig Start Date End Date Taking? Authorizing Provider  clemastine (TAVIST) 2.68 MG TABS tablet Take 2.68 mg by mouth daily.    Historical Provider, MD  fexofenadine (ALLEGRA) 180 MG tablet Take 180 mg by mouth 2 (two) times daily.    Historical Provider, MD  folic acid (FOLVITE) 1 MG tablet Take 1 mg by mouth daily.    Historical Provider, MD  hydrOXYzine (ATARAX/VISTARIL) 25 MG tablet 8 tablets at bedtime    Historical Provider, MD  ibuprofen (ADVIL,MOTRIN) 800 MG tablet Take 800 mg by mouth every 8 (eight) hours as needed.    Historical Provider, MD  itraconazole (SPORANOX) 100 MG capsule Take 100 mg by mouth 2 (two) times daily.    Historical Provider, MD  Mometasone Furoate [facility](ASMANEX HFA) 100 MCG/ACT AERO Inhale 2 puffs into the lungs 2 (two) times daily. 03/23/15   Nyoka CowdenMichael B Wert, MD  montelukast (SINGULAIR) 10 MG tablet Take 10 mg by mouth at bedtime.    Historical Provider, MD  predniSONE (DELTASONE) 10 MG  tablet Take 10 mg by mouth daily with breakfast.    Historical Provider, MD  ranitidine (ZANTAC) 300 MG capsule Take 300 mg by mouth 2 (two) times daily.    Historical Provider, MD  Vitamin D, Ergocalciferol, (DRISDOL) 50000 UNITS CAPS capsule Take 50,000 Units by mouth 2 (two) times a week.    Historical Provider, MD   BP 115/64 mmHg  Pulse 90  Temp(Src) 97.7 F (36.5 C) (Oral)  Resp 22  SpO2 97%  LMP 02/12/2016 Physical Exam  Constitutional: She is oriented to person, place, and time. She appears well-developed and well-nourished. She is  cooperative.  Non-toxic appearance. She does not have a sickly appearance. She does not appear ill. No distress.  HENT:  Head: Normocephalic and atraumatic.  Right Ear: External ear normal.  Left Ear: External ear normal.  Nose: Nose normal.  Neck: Normal range of motion and phonation normal.  Cardiovascular: Normal rate and regular rhythm.   Pulses:      Radial pulses are 2+ on the right side, and 2+ on the left side.       Dorsalis pedis pulses are 2+ on the right side, and 2+ on the left side.  HR 90s, No peripheral edema  Pulmonary/Chest: Effort normal and breath sounds normal. She has no decreased breath sounds.  Abdominal: Soft. She exhibits no distension. There is no tenderness.  Neurological: She is alert and oriented to person, place, and time.  Face symmetric, speech clear and appropriate, moves all extremities spontaneously  Skin: Skin is warm and dry. No rash noted. She is not diaphoretic.  Psychiatric: She has a normal mood and affect. Her mood appears not anxious. She does not exhibit a depressed mood.  Vitals reviewed.   ED Course  Procedures (including critical care time) Labs Review Labs Reviewed  BASIC METABOLIC PANEL - Abnormal; Notable for the following:    Glucose, Bld 119 (*)    Creatinine, Ser 1.01 (*)    All other components within normal limits  CBC - Abnormal; Notable for the following:    WBC 13.6 (*)    All other components within normal limits  URINALYSIS, ROUTINE W REFLEX MICROSCOPIC (NOT AT [facility]) - Abnormal; Notable for the following:    APPearance HAZY (*)    Specific Gravity, Urine 1.035 (*)    Hgb urine dipstick SMALL (*)    All other components within normal limits  URINE MICROSCOPIC-ADD ON - Abnormal; Notable for the following:    Squamous Epithelial / LPF 6-30 (*)    Bacteria, UA FEW (*)    All other components within normal limits  CBG MONITORING, ED - Abnormal; Notable for the following:    Glucose-Capillary 127 (*)    All other  components within normal limits  MAGNESIUM  TSH  T4, FREE  I-STAT BETA HCG BLOOD, ED ([facility], [facility], [facility] ONLY)  I-STAT TROPOININ, ED    Imaging Review No results found. I have personally reviewed and evaluated these images and lab results as part of my medical decision-making.   EKG Interpretation   Date/Time:  Sunday February 25 2016 14:05:28 EDT Ventricular Rate:  101 PR Interval:  150 QRS Duration: 66 QT Interval:  334 QTC Calculation: 433 R Axis:   53 Text Interpretation:  Sinus tachycardia Otherwise normal ECG No old  tracing to compare Confirmed by GOLDSTON MD, SCOTT [phone]) on 02/25/2016  2:20:38 PM      MDM   Final diagnoses:  Palpitations   30 year old female with past  medical history of chronic autoimmune urticaria, aspergillosis of the sinuses presents to the ED after an episode of palpitations associated with nausea, lightheadedness, and clamminess. Remainder of history of present illness, review of systems, and exam as above.  Exam notable for a well-appearing female in no distress. Heart rate is in the 90s. She still endorses feeling like her heart is "racing".  Labs obtained including a CBC, BMP, magnesium, thyroid studies, urinalysis. HCG undetectable. EKG notable for sinus tach with a rate of 101. She is low risk by well's criteria. I do not suspect PE.  Labs reviewed by me. Notable for no anemia, normal electrolytes. Urinalysis is contaminated but appears unremarkable, other than being very concentrated. IV fluids given.  Thyroid function studies ordered. While in the ED her heart rate remained in the 80s to 90s. She was hydrated and given Tylenol for her headache.  I do not suspect WPW, Brugada, thyrotoxicosis, or any other life threatening cause of her subjective feeling of palpitations at this time. Advised her to follow-up with PCP to get the results of her thyroid studies. We discussed strict return precautions and these were given in writing as well. She is to  follow up with her PCP in 1-2 days. Discharged home.  This case was managed in conjunction with my attending, Dr. Jodi Mourning.     Maxine Glenn, MD 02/25/16 1732  Blane Ohara, MD 02/28/16 1136

## 2016-02-25 NOTE — ED Notes (Addendum)
Pt c/o onset nausea, diaphoresis, racing heart just pta. 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 She reports she continues to feel like her heart is pounding too hard and she is now having a migraine headache. She is alert and breathing easily, diaphoretic

## 2016-02-25 NOTE — Discharge Instructions (Signed)

## 2016-03-12 DIAGNOSIS — L501 Idiopathic urticaria: Secondary | ICD-10-CM | POA: Diagnosis not present

## 2016-03-13 DIAGNOSIS — F419 Anxiety disorder, unspecified: Secondary | ICD-10-CM | POA: Diagnosis not present

## 2016-03-13 DIAGNOSIS — J329 Chronic sinusitis, unspecified: Secondary | ICD-10-CM | POA: Diagnosis not present

## 2016-03-19 DIAGNOSIS — Z1159 Encounter for screening for other viral diseases: Secondary | ICD-10-CM | POA: Diagnosis not present

## 2016-03-19 DIAGNOSIS — Z114 Encounter for screening for human immunodeficiency virus [HIV]: Secondary | ICD-10-CM | POA: Diagnosis not present

## 2016-03-19 DIAGNOSIS — Z113 Encounter for screening for infections with a predominantly sexual mode of transmission: Secondary | ICD-10-CM | POA: Diagnosis not present

## 2016-03-19 DIAGNOSIS — Z01419 Encounter for gynecological examination (general) (routine) without abnormal findings: Secondary | ICD-10-CM | POA: Diagnosis not present

## 2016-03-19 DIAGNOSIS — N870 Mild cervical dysplasia: Secondary | ICD-10-CM | POA: Diagnosis not present

## 2016-03-19 DIAGNOSIS — Z6832 Body mass index (BMI) 32.0-32.9, adult: Secondary | ICD-10-CM | POA: Diagnosis not present

## 2016-03-19 DIAGNOSIS — R875 Abnormal microbiological findings in specimens from female genital organs: Secondary | ICD-10-CM | POA: Diagnosis not present

## 2016-04-02 DIAGNOSIS — H6123 Impacted cerumen, bilateral: Secondary | ICD-10-CM | POA: Diagnosis not present

## 2016-04-02 DIAGNOSIS — J0101 Acute recurrent maxillary sinusitis: Secondary | ICD-10-CM | POA: Diagnosis not present

## 2016-04-12 DIAGNOSIS — Z760 Encounter for issue of repeat prescription: Secondary | ICD-10-CM | POA: Diagnosis not present

## 2016-04-16 DIAGNOSIS — L501 Idiopathic urticaria: Secondary | ICD-10-CM | POA: Diagnosis not present

## 2016-04-26 DIAGNOSIS — R432 Parageusia: Secondary | ICD-10-CM | POA: Diagnosis not present

## 2016-04-26 DIAGNOSIS — J320 Chronic maxillary sinusitis: Secondary | ICD-10-CM | POA: Diagnosis not present

## 2016-04-26 DIAGNOSIS — Z9889 Other specified postprocedural states: Secondary | ICD-10-CM | POA: Diagnosis not present

## 2016-04-30 ENCOUNTER — Encounter (HOSPITAL_COMMUNITY): Payer: Self-pay | Admitting: *Deleted

## 2016-04-30 ENCOUNTER — Emergency Department (HOSPITAL_COMMUNITY)
Admission: EM | Admit: 2016-04-30 | Discharge: 2016-05-01 | Disposition: A | Discharge status: Home / Self Care | Payer: 59 | Attending: Emergency Medicine | Admitting: Emergency Medicine

## 2016-04-30 DIAGNOSIS — J029 Acute pharyngitis, unspecified: Secondary | ICD-10-CM | POA: Insufficient documentation

## 2016-04-30 DIAGNOSIS — R110 Nausea: Secondary | ICD-10-CM | POA: Insufficient documentation

## 2016-04-30 DIAGNOSIS — R05 Cough: Secondary | ICD-10-CM | POA: Diagnosis not present

## 2016-04-30 NOTE — ED Notes (Addendum)
Was told Strep was negative & PA was informed. Lab having problem entering data.

## 2016-04-30 NOTE — ED Triage Notes (Signed)
Pt states she was recently diagnosed with a sinus infection x 1 week ago and states the symptoms have gotten worse; pt states she is now coughing up clear sputum

## 2016-05-01 DIAGNOSIS — J029 Acute pharyngitis, unspecified: Secondary | ICD-10-CM | POA: Diagnosis not present

## 2016-05-01 DIAGNOSIS — R110 Nausea: Secondary | ICD-10-CM | POA: Diagnosis not present

## 2016-05-01 LAB — POC URINE PREG, ED: Preg Test, Ur: NEGATIVE

## 2016-05-01 LAB — RAPID STREP SCREEN (MED CTR MEBANE ONLY): Streptococcus, Group A Screen (Direct): NEGATIVE

## 2016-05-01 NOTE — ED Provider Notes (Signed)
[facility]Y DEPT Provider Note   CSN: [id number] Arrival date & time: 04/30/16  2101  First Provider Contact:  None       History   Chief Complaint Chief Complaint  Patient presents with  . Sore Throat    HPI [name] is a 30 y.o. female.  [name] is a 30 y.o. Female presenting with a one week history of uri type symptoms which started as nasal congestion with clear rhinorrhea and has progressed to thick white nasal discharge and now has increasing sore throat with copious post nasal drip, cough also productive of white sputum.  She had low grade fever which have improved since her ENT specialist in [place] started her on Levaquin for a presumptive sinusitis 3 days ago.   Symptoms do not include shortness of breath, chest pain, headache, dizziness but she has had several episodes of post prandial  Nausea without vomiting or diarrhea this past week.  She took a home pregnancy test which was negative.  She is currently due for her menses with LMP 03/30/16.  The patient takes allegra d daily for chronic allergy symptoms.  She endorses her cough is keeping her awake.  She denies wheezing, orthopnea and shortness of breath.    The history is provided by the patient.    Past Medical History:  Diagnosis Date  . Aspergillosis (HCC)    sinus    Patient Active Problem List   Diagnosis Date Noted  . Cough 03/23/2015    Past Surgical History:  Procedure Laterality Date  . KNEE SURGERY Right 2007  . NASAL SINUS SURGERY    . TONSILLECTOMY  2008    OB History    No data available       Home Medications    Prior to Admission medications   Medication Sig Start Date End Date Taking? Authorizing Provider  clemastine (TAVIST) 2.68 MG TABS tablet Take 2.68 mg by mouth daily.    Historical Provider, MD  fexofenadine (ALLEGRA) 180 MG tablet Take 180 mg by mouth 2 (two) times daily.    Historical Provider, MD  folic acid (FOLVITE) 1 MG tablet Take 1 mg by mouth  daily.    Historical Provider, MD  hydrOXYzine (ATARAX/VISTARIL) 25 MG tablet 8 tablets at bedtime    Historical Provider, MD  ibuprofen (ADVIL,MOTRIN) 800 MG tablet Take 800 mg by mouth every 8 (eight) hours as needed (pain).     Historical Provider, MD  montelukast (SINGULAIR) 10 MG tablet Take 10 mg by mouth at bedtime.    Historical Provider, MD  ranitidine (ZANTAC) 300 MG capsule Take 300 mg by mouth every evening.     Historical Provider, MD    Family History Family History  Problem Relation Age of Onset  . Allergies Sister   . Allergies Mother   . Asthma Mother   . Allergies Father     Social History Social History  Substance Use Topics  . Smoking status: Never Smoker  . Smokeless tobacco: Never Used  . Alcohol use No     Allergies   Penicillins; Shrimp [shellfish allergy]; Eggs or egg-derived products; and Wheat bran   Review of Systems Review of Systems  Constitutional: Positive for fever. Negative for chills.  HENT: Positive for congestion, rhinorrhea, sinus pressure and sore throat. Negative for ear pain, trouble swallowing and voice change.   Eyes: Negative for discharge and itching.  Respiratory: Positive for cough. Negative for shortness of breath, wheezing and stridor.   Cardiovascular:  Negative for chest pain.  Gastrointestinal: Positive for nausea. Negative for abdominal pain and vomiting.  Genitourinary: Negative.   Musculoskeletal: Negative.   Skin: Negative.      Physical Exam Updated Vital Signs BP 123/88 (BP Location: Left Arm)   Pulse 78   Temp 98.2 F (36.8 C) (Oral)   Resp 18   Ht 5\' 4"  (1.626 m)   Wt 85.7 kg   LMP 03/30/2016   SpO2 98%   BMI 32.44 kg/m   Physical Exam  Constitutional: She is oriented to person, place, and time. She appears well-developed and well-nourished.  HENT:  Head: Normocephalic and atraumatic.  Right Ear: Tympanic membrane and ear canal normal.  Left Ear: Tympanic membrane and ear canal normal.  Nose:  Mucosal edema and rhinorrhea present.  Mouth/Throat: Uvula is midline, oropharynx is clear and moist and mucous membranes are normal. No oropharyngeal exudate, posterior oropharyngeal edema, posterior oropharyngeal erythema or tonsillar abscesses.  Eyes: Conjunctivae are normal.  Cardiovascular: Normal rate and normal heart sounds.   Pulmonary/Chest: Effort normal and breath sounds normal. No respiratory distress. She has no decreased breath sounds. She has no wheezes. She has no rhonchi. She has no rales.  Frequent dry cough during visit. No respiratory distress.  Abdominal: Soft. There is no tenderness.  Musculoskeletal: Normal range of motion.  Neurological: She is alert and oriented to person, place, and time.  Skin: Skin is warm and dry. No rash noted.  Psychiatric: She has a normal mood and affect.     ED Treatments / Results  Labs (all labs ordered are listed, but only abnormal results are displayed) Labs Reviewed  RAPID STREP SCREEN (NOT AT [facility])  CULTURE, GROUP A STREP ([facility])  POC URINE PREG, ED  POC URINE PREG, ED    EKG  EKG Interpretation None       Radiology No results found.  Procedures Procedures (including critical care time)  Medications Ordered in ED Medications - No data to display   Initial Impression / Assessment and Plan / ED Course  I have reviewed the triage vital signs and the nursing notes.  Pertinent labs & imaging results that were available during my care of the patient were reviewed by me and considered in my medical decision making (see chart for details).  Clinical Course    Strep negative.  Pt with pharyngitis in setting of copious PND and cough, suspect pain result of these other sx.  She was encouraged to finish her Levaquin and f/u with her ent specialist as needed if sx persist.  She was prescribed phenergan/codeine for cough and nausea (which I suspect is also due to PND).  Preg negative.   Final Clinical Impressions(s) / ED  Diagnoses   Final diagnoses:  Acute pharyngitis, unspecified pharyngitis type    New Prescriptions Discharge Medication List as of 05/01/2016  1:43 AM       Burgess AmorJulie Naevia Unterreiner, PA-C 05/01/16 1349    Maia PlanJoshua G Long, MD 05/01/16 [phone]

## 2016-05-01 NOTE — ED Notes (Signed)
POC pregnancy negative

## 2016-05-01 NOTE — ED Notes (Signed)
Pt alert & oriented x4, stable gait. Patient given discharge instructions, paperwork & prescription(s). Patient  instructed to stop at the registration desk to finish any additional paperwork. Patient verbalized understanding. Pt left department w/ no further questions. 

## 2016-05-04 LAB — CULTURE, GROUP A STREP (THRC)

## 2016-05-14 DIAGNOSIS — L501 Idiopathic urticaria: Secondary | ICD-10-CM | POA: Diagnosis not present

## 2016-05-29 ENCOUNTER — Encounter: Payer: Self-pay | Admitting: Internal Medicine

## 2016-06-11 DIAGNOSIS — L501 Idiopathic urticaria: Secondary | ICD-10-CM | POA: Diagnosis not present

## 2016-06-27 ENCOUNTER — Inpatient Hospital Stay (HOSPITAL_COMMUNITY)
Admission: AD | Admit: 2016-06-27 | Discharge: 2016-06-28 | Discharge status: Against Medical Advice | Payer: 59 | Source: Ambulatory Visit | Attending: Obstetrics and Gynecology | Admitting: Obstetrics and Gynecology

## 2016-06-27 ENCOUNTER — Encounter (HOSPITAL_COMMUNITY): Payer: Self-pay

## 2016-06-27 DIAGNOSIS — R1031 Right lower quadrant pain: Secondary | ICD-10-CM | POA: Diagnosis not present

## 2016-06-27 DIAGNOSIS — Z5321 Procedure and treatment not carried out due to patient leaving prior to being seen by health care provider: Secondary | ICD-10-CM | POA: Diagnosis not present

## 2016-06-27 DIAGNOSIS — R1032 Left lower quadrant pain: Secondary | ICD-10-CM | POA: Diagnosis not present

## 2016-06-27 LAB — URINALYSIS, ROUTINE W REFLEX MICROSCOPIC
BILIRUBIN URINE: NEGATIVE
Glucose, UA: NEGATIVE mg/dL
KETONES UR: NEGATIVE mg/dL
NITRITE: NEGATIVE
PROTEIN: NEGATIVE mg/dL
Specific Gravity, Urine: <1.005 — ABNORMAL LOW (ref 1.005–1.030)
pH: 5.5 (ref 5.0–8.0)

## 2016-06-27 LAB — URINE MICROSCOPIC-ADD ON

## 2016-06-27 LAB — POCT PREGNANCY, URINE: Preg Test, Ur: NEGATIVE

## 2016-06-27 NOTE — MAU Note (Signed)
Pt c/o ovarian pain x2-3 days-rates 6/10, vag spotting x1 month. Denies urinary s/s. Tried to get an appointment with OBGYN, but cannot get an appointment until next Thursday. Has not taken anything.

## 2016-06-28 DIAGNOSIS — Z5321 Procedure and treatment not carried out due to patient leaving prior to being seen by health care provider: Secondary | ICD-10-CM | POA: Diagnosis not present

## 2016-06-28 NOTE — MAU Note (Signed)
Not in lobby x 3  

## 2016-06-28 NOTE — MAU Note (Signed)
Not in lobby x2.

## 2016-06-28 NOTE — MAU Note (Signed)
Not in lobby x1  

## 2016-06-29 ENCOUNTER — Inpatient Hospital Stay (HOSPITAL_COMMUNITY): Payer: 59

## 2016-06-29 ENCOUNTER — Encounter (HOSPITAL_COMMUNITY): Payer: Self-pay

## 2016-06-29 ENCOUNTER — Inpatient Hospital Stay (HOSPITAL_COMMUNITY)
Admission: AD | Admit: 2016-06-29 | Discharge: 2016-06-29 | Disposition: A | Discharge status: Home / Self Care | Payer: 59 | Source: Ambulatory Visit | Attending: Obstetrics and Gynecology | Admitting: Obstetrics and Gynecology

## 2016-06-29 DIAGNOSIS — N854 Malposition of uterus: Secondary | ICD-10-CM | POA: Insufficient documentation

## 2016-06-29 DIAGNOSIS — R1032 Left lower quadrant pain: Secondary | ICD-10-CM | POA: Diagnosis not present

## 2016-06-29 DIAGNOSIS — Z880 Allergy status to penicillin: Secondary | ICD-10-CM | POA: Insufficient documentation

## 2016-06-29 DIAGNOSIS — R102 Pelvic and perineal pain: Secondary | ICD-10-CM | POA: Diagnosis not present

## 2016-06-29 DIAGNOSIS — N938 Other specified abnormal uterine and vaginal bleeding: Secondary | ICD-10-CM | POA: Diagnosis not present

## 2016-06-29 DIAGNOSIS — R1030 Lower abdominal pain, unspecified: Secondary | ICD-10-CM | POA: Diagnosis present

## 2016-06-29 HISTORY — DX: Polycystic ovarian syndrome: E28.2

## 2016-06-29 HISTORY — DX: Herpesviral infection, unspecified: B00.9

## 2016-06-29 LAB — CBC WITH DIFFERENTIAL/PLATELET
Basophils Absolute: 0.1 10*3/uL (ref 0.0–0.1)
Basophils Relative: 1 %
EOS PCT: 3 %
Eosinophils Absolute: 0.4 10*3/uL (ref 0.0–0.7)
HCT: 40.1 % (ref 36.0–46.0)
HEMOGLOBIN: 13.7 g/dL (ref 12.0–15.0)
LYMPHS ABS: 4.2 10*3/uL — AB (ref 0.7–4.0)
LYMPHS PCT: 36 %
MCH: 29.6 pg (ref 26.0–34.0)
MCHC: 34.2 g/dL (ref 30.0–36.0)
MCV: 86.6 fL (ref 78.0–100.0)
Monocytes Absolute: 0.4 10*3/uL (ref 0.1–1.0)
Monocytes Relative: 4 %
NEUTROS PCT: 56 %
Neutro Abs: 6.8 10*3/uL (ref 1.7–7.7)
Platelets: 236 10*3/uL (ref 150–400)
RBC: 4.63 MIL/uL (ref 3.87–5.11)
RDW: 12.9 % (ref 11.5–15.5)
WBC: 11.9 10*3/uL — AB (ref 4.0–10.5)

## 2016-06-29 LAB — POCT PREGNANCY, URINE: Preg Test, Ur: NEGATIVE

## 2016-06-29 MED ORDER — KETOROLAC TROMETHAMINE 60 MG/2ML IM SOLN
60.0000 mg | Freq: Once | INTRAMUSCULAR | Status: AC
  Administered 2016-06-29: 60 mg via INTRAMUSCULAR
  Filled 2016-06-29: qty 2

## 2016-06-29 MED ORDER — METRONIDAZOLE 500 MG PO TABS
500.0000 mg | ORAL_TABLET | Freq: Once | ORAL | Status: AC
  Administered 2016-06-29: 500 mg via ORAL
  Filled 2016-06-29: qty 1

## 2016-06-29 MED ORDER — METRONIDAZOLE 500 MG PO TABS: 500.0000 mg | ORAL_TABLET | Freq: Two times a day (BID) | ORAL | 0 refills | Status: AC

## 2016-06-29 MED ORDER — DOXYCYCLINE HYCLATE 100 MG PO TABS
100.0000 mg | ORAL_TABLET | Freq: Once | ORAL | Status: AC
  Administered 2016-06-29: 100 mg via ORAL
  Filled 2016-06-29: qty 1

## 2016-06-29 MED ORDER — DOXYCYCLINE HYCLATE 100 MG PO TABS: 100.0000 mg | ORAL_TABLET | Freq: Two times a day (BID) | ORAL | 0 refills | Status: AC

## 2016-06-29 NOTE — Discharge Instructions (Signed)

## 2016-06-29 NOTE — MAU Provider Note (Signed)
History     Chief Complaint  Patient presents with  . Abdominal Pain  . Vaginal Bleeding   30 yo G1P1 WF presents for the 2nd time( left w/o being seen 2 days ago) with c/o  Lower abdominal pain ( L>R x 3-4 days) w/o assoc n/v/f. Pt c/o vaginal spotting for two months. Pt has paragard IUD and hx PCOS. Pt describes pain  As constant nonradiating ( 8/10). No urinary sx. BM reg. 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 On visit here 2 days ago, upt was neg. U/a TNTC rbc. ucx neg. Last coitus 3 days ago  Pertinent Gynecological History: Menses: irreg Bleeding: dysfunctional uterine bleeding Contraception: IUD DES exposure: denies Blood transfusions: none Sexually transmitted diseases: no past history Previous GYN Procedures: none  Last mammogram: n/a Date: n/a Last pap: normal Date: 02/2016   Past Medical History:  Diagnosis Date  . Aspergillosis (HCC)    sinus  . Herpes   . PCOS (polycystic ovarian syndrome)     Past Surgical History:  Procedure Laterality Date  . KNEE SURGERY Right 2007  . NASAL SINUS SURGERY    . TONSILLECTOMY  2008    Family History  Problem Relation Age of Onset  . Allergies Sister   . Allergies Mother   . Asthma Mother   . Allergies Father     Social History  Substance Use Topics  . Smoking status: Never Smoker  . Smokeless tobacco: Never Used  . Alcohol use No    Allergies:  Allergies  Allergen Reactions  . Penicillins Anaphylaxis  . Shrimp [Shellfish Allergy] Anaphylaxis  . Eggs Or Egg-Derived Products     Yolk-nausea  . Wheat Bran Other (See Comments)    inflammation    Prescriptions Prior to Admission  Medication Sig Dispense Refill Last Dose  . clemastine (TAVIST) 2.68 MG TABS tablet Take 2.68 mg by mouth daily.   Past Week at Unknown time  . fexofenadine (ALLEGRA) 180 MG tablet Take 180 mg by mouth 2 (two) times daily.   02/25/2016 at Unknown time  . folic acid (FOLVITE) 1 MG  tablet Take 1 mg by mouth daily.   02/24/2016 at Unknown time  . hydrOXYzine (ATARAX/VISTARIL) 25 MG tablet 8 tablets at bedtime   02/24/2016 at Unknown time  . ibuprofen (ADVIL,MOTRIN) 800 MG tablet Take 800 mg by mouth every 8 (eight) hours as needed (pain).    Past Week at Unknown time  . montelukast (SINGULAIR) 10 MG tablet Take 10 mg by mouth at bedtime.   02/24/2016 at Unknown time  . ranitidine (ZANTAC) 300 MG capsule Take 300 mg by mouth every evening.    02/24/2016 at Unknown time     Physical Exam   Blood pressure 125/75, pulse 83, temperature 97.5 F (36.4 C), temperature source Oral, resp. rate 16, height 5\' 4"  (1.626 m), weight 84.4 kg (186 lb).  General appearance: alert, cooperative and no distress Lungs: clear to auscultation bilaterally Heart: regular rate and rhythm, S1, S2 normal, no murmur, click, rub or gallop Abdomen: soft, non-tender; bowel sounds normal; no masses,  no organomegaly Pelvic: external genitalia normal, no cervical motion tenderness, uterus normal size, shape, and consistency and cervix parous IUD in place. scant blood in vault. no bladder tenderness. right adnexal tenderness without discrete mass Extremities: no edema, redness or tenderness in the calves or thighs   G/C done Care elsewhere  Reviewed : CT scan 10/5 showed  no diverticular disease, appendix not visualized no aqcute findings in pelvis or abdomen  ED Course  Pelvic pain DUB P) toradol IM. Pelvic sonogram. cbc MDM  Addendum:  CBC    Component Value Date/Time   WBC 11.9 (H) 06/29/2016 1937   RBC 4.63 06/29/2016 1937   HGB 13.7 06/29/2016 1937   HCT 40.1 06/29/2016 1937   PLT 236 06/29/2016 1937   MCV 86.6 06/29/2016 1937   MCH 29.6 06/29/2016 1937   MCHC 34.2 06/29/2016 1937   RDW 12.9 06/29/2016 1937   LYMPHSABS 4.2 (H) 06/29/2016 1937   MONOABS 0.4 06/29/2016 1937   EOSABS 0.4 06/29/2016 1937   BASOSABS 0.1 06/29/2016 1937    US Transvaginal Non-ob  Result Date:  06/29/2016 CLINICAL DATA:  Acute onset of pelvic pain.  Initial encounter. EXAM: TRANSABDOMINAL AND TRANSVAGINAL ULTRASOUND OF PELVIS TECHNIQUE: Both transabdominal and transvaginal ultrasound examinations of the pelvis were performed. Transabdominal technique was performed for global imaging of the pelvis including uterus, ovaries, adnexal regions, and pelvic cul-de-sac. It was necessary to proceed with endovaginal exam following the transabdominal exam to visualize the uterus and ovaries in greater detail. COMPARISON:  CT of the abdomen and pelvis performed 06/27/2016, and pelvic ultrasound performed 10/08/2015 FINDINGS: Uterus Measurements: 7.3 x 5.2 x 5.8 cm. No fibroids or other mass visualized. The uterus is retroverted. Nabothian cysts are seen at the cervix. Endometrium Thickness: Not well characterized due to the patient's intrauterine device. The intrauterine device is noted in expected position at the fundus of the uterus. Right ovary Measurements: 3.8 x 3.0 x 3.6 cm. Normal appearance/no adnexal mass. Left ovary Measurements: 4.0 x 2.6 x 3.1 cm. Normal appearance/no adnexal mass. Other findings No abnormal free fluid. IMPRESSION: Unremarkable pelvic ultrasound. Intrauterine device noted in expected position at the fundus of the uterus. Electronically Signed   By: Roanna Raider M.D.   On: 06/29/2016 21:49   US Pelvis Complete  Result Date: 06/29/2016 CLINICAL DATA:  Acute onset of pelvic pain.  Initial encounter. EXAM: TRANSABDOMINAL AND TRANSVAGINAL ULTRASOUND OF PELVIS TECHNIQUE: Both transabdominal and transvaginal ultrasound examinations of the pelvis were performed. Transabdominal technique was performed for global imaging of the pelvis including uterus, ovaries, adnexal regions, and pelvic cul-de-sac. It was necessary to proceed with endovaginal exam following the transabdominal exam to visualize the uterus and ovaries in greater detail. COMPARISON:  CT of the abdomen and pelvis performed  06/27/2016, and pelvic ultrasound performed 10/08/2015 FINDINGS: Uterus Measurements: 7.3 x 5.2 x 5.8 cm. No fibroids or other mass visualized. The uterus is retroverted. Nabothian cysts are seen at the cervix. Endometrium Thickness: Not well characterized due to the patient's intrauterine device. The intrauterine device is noted in expected position at the fundus of the uterus. Right ovary Measurements: 3.8 x 3.0 x 3.6 cm. Normal appearance/no adnexal mass. Left ovary Measurements: 4.0 x 2.6 x 3.1 cm. Normal appearance/no adnexal mass. Other findings No abnormal free fluid. IMPRESSION: Unremarkable pelvic ultrasound. Intrauterine device noted in expected position at the fundus of the uterus. Electronically Signed   By: Roanna Raider M.D.   On: 06/29/2016 21:49  pt advised of sono results. No clear etiology for pain. Given IUD and irreg bleeding will give doxycycline and flagyl for poss endometritis. Advised pt to keep office appt 10/12 Will send in script for ultram 50mg  (#15) via office D/c home Tiphany Fayson A, MD 7:32 PM 06/29/2016

## 2016-06-29 NOTE — Progress Notes (Signed)
Written and verbal d/c instructions given and understanding voiced. To keep scheduled appt at office for this coming THursday

## 2016-06-29 NOTE — MAU Note (Signed)
Dr. Cherly Hensenousins familiar with patient, c/o bilaterally lower abdominal pain and spotting x 2 months. MD to come to evaluate patient in MAU.

## 2016-06-29 NOTE — MAU Note (Signed)
Patient came to MAU on Thursday night, lower abdominal pain bilaterally pain is 9/10 much worse, spotting for 2 months, has history of PCOS (diagnosed last August).

## 2016-06-29 NOTE — Progress Notes (Signed)
U/S and lab reports called to Dr Cherly Hensenousins. WIll d/c pt home. DR Cherly Hensenousins spoke with pt on phone and discussed test results and d/c plan. Pt agrees

## 2016-06-30 LAB — URINE CULTURE: Culture: >=100000 — AB

## 2016-07-01 LAB — CERVICOVAGINAL ANCILLARY ONLY
Chlamydia: NEGATIVE
Neisseria Gonorrhea: NEGATIVE

## 2016-07-02 DIAGNOSIS — N888 Other specified noninflammatory disorders of cervix uteri: Secondary | ICD-10-CM | POA: Diagnosis not present

## 2016-07-02 DIAGNOSIS — N926 Irregular menstruation, unspecified: Secondary | ICD-10-CM | POA: Diagnosis not present

## 2016-07-02 DIAGNOSIS — R102 Pelvic and perineal pain: Secondary | ICD-10-CM | POA: Diagnosis not present

## 2016-07-02 DIAGNOSIS — B9620 Unspecified Escherichia coli [E. coli] as the cause of diseases classified elsewhere: Secondary | ICD-10-CM | POA: Diagnosis not present

## 2016-07-02 DIAGNOSIS — Z09 Encounter for follow-up examination after completed treatment for conditions other than malignant neoplasm: Secondary | ICD-10-CM | POA: Diagnosis not present

## 2016-07-09 DIAGNOSIS — L501 Idiopathic urticaria: Secondary | ICD-10-CM | POA: Diagnosis not present

## 2016-07-14 ENCOUNTER — Encounter (HOSPITAL_COMMUNITY): Payer: Self-pay

## 2016-07-14 ENCOUNTER — Emergency Department (HOSPITAL_COMMUNITY)
Admission: EM | Admit: 2016-07-14 | Discharge: 2016-07-14 | Disposition: A | Discharge status: Home / Self Care | Payer: 59 | Attending: Emergency Medicine | Admitting: Emergency Medicine

## 2016-07-14 ENCOUNTER — Emergency Department (HOSPITAL_COMMUNITY): Payer: 59

## 2016-07-14 DIAGNOSIS — R112 Nausea with vomiting, unspecified: Secondary | ICD-10-CM | POA: Insufficient documentation

## 2016-07-14 DIAGNOSIS — Z79899 Other long term (current) drug therapy: Secondary | ICD-10-CM | POA: Insufficient documentation

## 2016-07-14 DIAGNOSIS — R42 Dizziness and giddiness: Secondary | ICD-10-CM | POA: Diagnosis not present

## 2016-07-14 DIAGNOSIS — F17200 Nicotine dependence, unspecified, uncomplicated: Secondary | ICD-10-CM | POA: Diagnosis not present

## 2016-07-14 DIAGNOSIS — R55 Syncope and collapse: Secondary | ICD-10-CM | POA: Diagnosis not present

## 2016-07-14 DIAGNOSIS — F419 Anxiety disorder, unspecified: Secondary | ICD-10-CM | POA: Diagnosis not present

## 2016-07-14 LAB — URINALYSIS, ROUTINE W REFLEX MICROSCOPIC
Bilirubin Urine: NEGATIVE
Glucose, UA: NEGATIVE mg/dL
Ketones, ur: NEGATIVE mg/dL
Nitrite: NEGATIVE
Protein, ur: NEGATIVE mg/dL
Specific Gravity, Urine: 1.016 (ref 1.005–1.030)
pH: 6.5 (ref 5.0–8.0)

## 2016-07-14 LAB — CBC
HEMATOCRIT: 42.5 % (ref 36.0–46.0)
Hemoglobin: 14.4 g/dL (ref 12.0–15.0)
MCH: 29.7 pg (ref 26.0–34.0)
MCHC: 33.9 g/dL (ref 30.0–36.0)
MCV: 87.6 fL (ref 78.0–100.0)
PLATELETS: 220 10*3/uL (ref 150–400)
RBC: 4.85 MIL/uL (ref 3.87–5.11)
RDW: 13 % (ref 11.5–15.5)
WBC: 12.4 10*3/uL — ABNORMAL HIGH (ref 4.0–10.5)

## 2016-07-14 LAB — BASIC METABOLIC PANEL
Anion gap: 9 (ref 5–15)
BUN: 9 mg/dL (ref 6–20)
CHLORIDE: 106 mmol/L (ref 101–111)
CO2: 23 mmol/L (ref 22–32)
CREATININE: 1.04 mg/dL — AB (ref 0.44–1.00)
Calcium: 8.7 mg/dL — ABNORMAL LOW (ref 8.9–10.3)
GFR calc Af Amer: >60 mL/min (ref >60)
GFR calc non Af Amer: >60 mL/min (ref >60)
GLUCOSE: 111 mg/dL — AB (ref 65–99)
POTASSIUM: 3.3 mmol/L — AB (ref 3.5–5.1)
Sodium: 138 mmol/L (ref 135–145)

## 2016-07-14 LAB — HEPATIC FUNCTION PANEL
ALK PHOS: 55 U/L (ref 38–126)
ALT: 23 U/L (ref 14–54)
AST: 28 U/L (ref 15–41)
Albumin: 3.7 g/dL (ref 3.5–5.0)
BILIRUBIN TOTAL: 0.4 mg/dL (ref 0.3–1.2)
Bilirubin, Direct: <0.1 mg/dL — ABNORMAL LOW (ref 0.1–0.5)
Total Protein: 6.3 g/dL — ABNORMAL LOW (ref 6.5–8.1)

## 2016-07-14 LAB — CBG MONITORING, ED: Glucose-Capillary: 93 mg/dL (ref 65–99)

## 2016-07-14 LAB — URINE MICROSCOPIC-ADD ON

## 2016-07-14 LAB — LIPASE, BLOOD: Lipase: 25 U/L (ref 11–51)

## 2016-07-14 LAB — POC URINE PREG, ED: PREG TEST UR: NEGATIVE

## 2016-07-14 MED ORDER — DIAZEPAM 5 MG PO TABS
5.0000 mg | ORAL_TABLET | Freq: Once | ORAL | Status: AC
  Administered 2016-07-14: 5 mg via ORAL
  Filled 2016-07-14: qty 1

## 2016-07-14 MED ORDER — MECLIZINE HCL 25 MG PO TABS: 25.0000 mg | ORAL_TABLET | Freq: Three times a day (TID) | ORAL | 0 refills | Status: DC | PRN

## 2016-07-14 MED ORDER — ONDANSETRON HCL 4 MG/2ML IJ SOLN
4.0000 mg | Freq: Once | INTRAMUSCULAR | Status: AC
  Administered 2016-07-14: 4 mg via INTRAVENOUS
  Filled 2016-07-14: qty 2

## 2016-07-14 MED ORDER — PROMETHAZINE HCL 25 MG/ML IJ SOLN
25.0000 mg | Freq: Once | INTRAMUSCULAR | Status: AC
  Administered 2016-07-14: 25 mg via INTRAVENOUS
  Filled 2016-07-14: qty 1

## 2016-07-14 MED ORDER — SODIUM CHLORIDE 0.9 % IV BOLUS (SEPSIS)
1000.0000 mL | Freq: Once | INTRAVENOUS | Status: AC
  Administered 2016-07-14: 1000 mL via INTRAVENOUS

## 2016-07-14 MED ORDER — MIDAZOLAM HCL 2 MG/2ML IJ SOLN
1.0000 mg | Freq: Once | INTRAMUSCULAR | Status: AC
  Administered 2016-07-14: 1 mg via INTRAVENOUS
  Filled 2016-07-14: qty 2

## 2016-07-14 MED ORDER — MECLIZINE HCL 25 MG PO TABS
25.0000 mg | ORAL_TABLET | Freq: Once | ORAL | Status: AC
  Administered 2016-07-14: 25 mg via ORAL
  Filled 2016-07-14: qty 1

## 2016-07-14 MED ORDER — ONDANSETRON 4 MG PO TBDP: 4.0000 mg | ORAL_TABLET | Freq: Three times a day (TID) | ORAL | 0 refills | Status: DC | PRN

## 2016-07-14 NOTE — ED Provider Notes (Signed)
[facility]-EMERGENCY DEPT Provider Note   CSN: [id number] Arrival date & time: 07/14/16  4098     History   Chief Complaint Chief Complaint  Patient presents with  . Near Syncope    HPI [name] is a 30 y.o. female.  [name] is a 30 y.o. Female who presents to the ED complaining of nausea and vomiting beginning early this morning. Patient reports multiple episodes of vomiting with nausea today. No abdominal pain. She reports after arriving to work early this morning she began to feel lightheaded with position change and almost passed out. She reports currently she feels like the room is spinning around her when she opens her eyes. She reports this dizziness and lightheadedness started after she had nausea and vomiting. She denies any abdominal pain. No diarrhea. No previous abdominal surgeries. No treatments prior to arrival today. Patient reports she is currently on Valtrex and Bactrim for "cervical inflammation." She's been on these medications for 10 days. Symptoms began this morning. Last bowel movement was yesterday and was normal. She denies fevers, hematemesis, diarrhea, double vision, headache, rashes, vaginal bleeding, vaginal discharge, back pain, urinary symptoms or syncope.   The history is provided by the patient. No language interpreter was used.  Near Syncope  Pertinent negatives include no chest pain, no abdominal pain, no headaches and no shortness of breath.    Past Medical History:  Diagnosis Date  . Aspergillosis (HCC)    sinus  . Herpes   . PCOS (polycystic ovarian syndrome)     Patient Active Problem List   Diagnosis Date Noted  . Cough 03/23/2015    Past Surgical History:  Procedure Laterality Date  . KNEE SURGERY Right 2007  . NASAL SINUS SURGERY    . TONSILLECTOMY  2008    OB History    Gravida Para Term Preterm AB Living   1 1   1   1    SAB TAB Ectopic Multiple Live Births           1       Home Medications    Prior to  Admission medications   Medication Sig Start Date End Date Taking? Authorizing Provider  clemastine (TAVIST) 2.68 MG TABS tablet Take 2.68 mg by mouth daily.   Yes Historical Provider, MD  fexofenadine (ALLEGRA) 180 MG tablet Take 180 mg by mouth 2 (two) times daily.   Yes Historical Provider, MD  hydrOXYzine (ATARAX/VISTARIL) 25 MG tablet Take 75 mg by mouth at bedtime.    Yes Historical Provider, MD  ibuprofen (ADVIL,MOTRIN) 800 MG tablet Take 800 mg by mouth every 8 (eight) hours as needed (pain).    Yes Historical Provider, MD  oxyCODONE-acetaminophen (PERCOCET/ROXICET) 5-325 MG tablet Take 1-2 tablets by mouth every 4 (four) hours as needed for moderate pain.  07/08/16  Yes Historical Provider, MD  valACYclovir (VALTREX) 500 MG tablet Take 500 mg by mouth daily. 07/04/16  Yes Historical Provider, MD  meclizine (ANTIVERT) 25 MG tablet Take 1 tablet (25 mg total) by mouth 3 (three) times daily as needed for dizziness. 07/14/16   Everlene Farrier, PA-C  ondansetron (ZOFRAN ODT) 4 MG disintegrating tablet Take 1 tablet (4 mg total) by mouth every 8 (eight) hours as needed for nausea or vomiting. 07/14/16   Everlene Farrier, PA-C    Family History Family History  Problem Relation Age of Onset  . Allergies Sister   . Allergies Mother   . Asthma Mother   . Allergies Father  Social History Social History  Substance Use Topics  . Smoking status: Light Tobacco Smoker  . Smokeless tobacco: Never Used  . Alcohol use No     Allergies   Penicillins; Shrimp [shellfish allergy]; Eggs or egg-derived products; and Wheat bran   Review of Systems Review of Systems  Constitutional: Negative for chills and fever.  HENT: Negative for congestion and sore throat.   Eyes: Negative for visual disturbance.  Respiratory: Negative for cough and shortness of breath.   Cardiovascular: Positive for near-syncope. Negative for chest pain.  Gastrointestinal: Positive for nausea and vomiting. Negative for  abdominal pain, blood in stool, constipation and diarrhea.  Genitourinary: Negative for difficulty urinating, dysuria, flank pain, frequency, urgency, vaginal bleeding and vaginal discharge.  Musculoskeletal: Negative for back pain, neck pain and neck stiffness.  Skin: Negative for rash.  Neurological: Positive for dizziness and light-headedness. Negative for syncope, speech difficulty, weakness, numbness and headaches.     Physical Exam Updated Vital Signs BP 114/71   Pulse 75   Temp 97.8 F (36.6 C) (Oral)   Resp 19   Ht 5\' 4"  (1.626 m)   Wt 81.6 kg   LMP 07/12/2016 (Exact Date)   SpO2 98%   BMI 30.90 kg/m   Physical Exam  Constitutional: She is oriented to person, place, and time. She appears well-developed and well-nourished. No distress.  Non-toxic appearing.   HENT:  Head: Normocephalic and atraumatic.  Right Ear: External ear normal.  Left Ear: External ear normal.  Mouth/Throat: Oropharynx is clear and moist.  Bilateral tympanic membranes are pearly-gray without erythema or loss of landmarks.   Eyes: Conjunctivae and EOM are normal. Pupils are equal, round, and reactive to light. Right eye exhibits no discharge. Left eye exhibits no discharge.  Neck: Normal range of motion. Neck supple. No JVD present. No tracheal deviation present.  Cardiovascular: Normal rate, regular rhythm, normal heart sounds and intact distal pulses.  Exam reveals no gallop and no friction rub.   No murmur heard. Pulmonary/Chest: Effort normal and breath sounds normal. No stridor. No respiratory distress. She has no wheezes. She has no rales.  Abdominal: Soft. Bowel sounds are normal. She exhibits no distension and no mass. There is no tenderness. There is no guarding.  Abdomen is soft and nontender to palpation. No peritoneal signs. No CVA or flank tenderness.  Musculoskeletal: She exhibits no edema.  Lymphadenopathy:    She has no cervical adenopathy.  Neurological: She is alert and oriented  to person, place, and time. No cranial nerve deficit. Coordination normal.  Normal gait. Alert and oriented x3. Cranial nerves are intact. Speech is clear and coherent. Vision is grossly intact. No nystagmus. No pronator drift. Sensation is intact to her bilateral upper and lower extremities.   Skin: Skin is warm and dry. Capillary refill takes less than 2 seconds. No rash noted. She is not diaphoretic. No erythema. No pallor.  Psychiatric: She has a normal mood and affect. Her behavior is normal.  Nursing note and vitals reviewed.    ED Treatments / Results  Labs (all labs ordered are listed, but only abnormal results are displayed) Labs Reviewed  BASIC METABOLIC PANEL - Abnormal; Notable for the following:       Result Value   Potassium 3.3 (*)    Glucose, Bld 111 (*)    Creatinine, Ser 1.04 (*)    Calcium 8.7 (*)    All other components within normal limits  CBC - Abnormal; Notable for the following:  WBC 12.4 (*)    All other components within normal limits  URINALYSIS, ROUTINE W REFLEX MICROSCOPIC (NOT AT [facility]) - Abnormal; Notable for the following:    Color, Urine AMBER (*)    APPearance CLOUDY (*)    Hgb urine dipstick LARGE (*)    Leukocytes, UA SMALL (*)    All other components within normal limits  HEPATIC FUNCTION PANEL - Abnormal; Notable for the following:    Total Protein 6.3 (*)    Bilirubin, Direct <0.1 (*)    All other components within normal limits  URINE MICROSCOPIC-ADD ON - Abnormal; Notable for the following:    Squamous Epithelial / LPF 6-30 (*)    Bacteria, UA MANY (*)    All other components within normal limits  URINE CULTURE  LIPASE, BLOOD  CBG MONITORING, ED  POC URINE PREG, ED    EKG  EKG Interpretation  Date/Time:  Sunday July 14 2016 05:33:42 EDT Ventricular Rate:  90 PR Interval:  148 QRS Duration: 68 QT Interval:  360 QTC Calculation: 440 R Axis:   50 Text Interpretation:  Normal sinus rhythm Normal ECG Confirmed by HORTON  MD,  COURTNEY ([id number]) on 07/14/2016 5:57:54 AM       Radiology Ct Head Wo Contrast  Result Date: 07/14/2016 CLINICAL DATA:  Syncope EXAM: CT HEAD WITHOUT CONTRAST TECHNIQUE: Contiguous axial images were obtained from the base of the skull through the vertex without intravenous contrast. COMPARISON:  None. FINDINGS: Brain: No intracranial hemorrhage, mass effect or midline shift. No acute cortical infarction. No hydrocephalus. The gray and white-matter differentiation is preserved. No mass lesion is noted on this unenhanced scan. Vascular: No hyperdense vessel or unexpected calcification. Skull: Normal. Negative for fracture or focal lesion. Sinuses/Orbits: No acute finding. Other: None. IMPRESSION: No acute intracranial abnormality. Electronically Signed   By: Natasha MeadLiviu  Pop M.D.   On: 07/14/2016 12:04    Procedures Procedures (including critical care time)  Medications Ordered in ED Medications  sodium chloride 0.9 % bolus 1,000 mL (0 mLs Intravenous Stopped 07/14/16 0831)  ondansetron (ZOFRAN) injection 4 mg (4 mg Intravenous Given 07/14/16 0652)  sodium chloride 0.9 % bolus 1,000 mL (0 mLs Intravenous Stopped 07/14/16 1022)  meclizine (ANTIVERT) tablet 25 mg (25 mg Oral Given 07/14/16 0841)  midazolam (VERSED) injection 1 mg (1 mg Intravenous Given 07/14/16 0842)  promethazine (PHENERGAN) injection 25 mg (25 mg Intravenous Given 07/14/16 1021)  sodium chloride 0.9 % bolus 1,000 mL (0 mLs Intravenous Stopped 07/14/16 1157)  meclizine (ANTIVERT) tablet 25 mg (25 mg Oral Given 07/14/16 1219)  diazepam (VALIUM) tablet 5 mg (5 mg Oral Given 07/14/16 1351)     Initial Impression / Assessment and Plan / ED Course  I have reviewed the triage vital signs and the nursing notes.  Pertinent labs & imaging results that were available during my care of the patient were reviewed by me and considered in my medical decision making (see chart for details).  Clinical Course   This is a 30 y.o. Female who  presents to the ED complaining of nausea and vomiting beginning early this morning. Patient reports multiple episodes of vomiting with nausea today. No abdominal pain. She reports after arriving to work early this morning she began to feel lightheaded with position change and almost passed out. She reports currently she feels like the room is spinning around her when she opens her eyes. She reports this dizziness and lightheadedness started after she had nausea and vomiting. She denies any abdominal  pain. No diarrhea. No previous abdominal surgeries. On exam the patient is afebrile nontoxic appearing. Her abdomen is soft and nontender to palpation. She has no focal neurological deficits. She does report room spinning dizziness with movement of her head. Pregnancy test is negative. Urinalysis is nitrite negative with small leukocytes and 6-30 white blood cells. Urine sent for culture. Patient currently on Bactrim. She denies any current urinary symptoms. BMP reveals a mildly elevated creatinine of 1.04. Hepatic function panel is unremarkable. Lipase is within normal limits. CBC is remarkable only for white count of 12,400. Repeat exam patient reports she is feeling better but still having this room spinning dizziness. Her abdomen is soft and nontender to palpation. Will try meclizine, Versed and additional fluid bolus and reevaluate. Patient and ambualted the bathroom with normal gait. I repeat exam patient reports her nausea seems to be returning and she is still having room spinning dizziness with movement of her head. Will try phenergan and more meclizine and reevaluate. She denies any vomiting or abdominal pain. Will obtain CT head at this time.  CT head without contrast is unremarkable. I rechecked patient still having some slight room spinning dizziness with movement of her head. HINTS exam is not concerning. She is tolerating PO without nausea or vomiting. She is ambulatory with normal gait. She is not  orthostatic. She feels comfortable with discharge. Will discharge with prescriptions for meclizine and Zofran and have her follow closely with primary care. I discussed strict and specific return precautions. I advised the patient to follow-up with their primary care provider this week. I advised the patient to return to the emergency department with new or worsening symptoms or new concerns. The patient verbalized understanding and agreement with plan.    Final Clinical Impressions(s) / ED Diagnoses   Final diagnoses:  Vertigo  Non-intractable vomiting with nausea, unspecified vomiting type    New Prescriptions Discharge Medication List as of 07/14/2016  2:53 PM    START taking these medications   Details  meclizine (ANTIVERT) 25 MG tablet Take 1 tablet (25 mg total) by mouth 3 (three) times daily as needed for dizziness., Starting Sun 07/14/2016, Print    ondansetron (ZOFRAN ODT) 4 MG disintegrating tablet Take 1 tablet (4 mg total) by mouth every 8 (eight) hours as needed for nausea or vomiting., Starting Sun 07/14/2016, Print         Everlene Farrier, PA-C 07/14/16 1528    Nira Conn, MD 07/15/16 [phone]

## 2016-07-14 NOTE — ED Triage Notes (Signed)
Pt was upstairs working and states she started having n/v since 3:30am; pt states she started tingling allover and had near syncope but was able to sit down first; pt states she is on antibiotic for inflammation of her cervix; Pt denies pain on arrival; pt a&ox 4 on arrival. Pt states when she open her eyes the whole room is spinning.

## 2016-07-15 LAB — URINE CULTURE

## 2016-07-24 DIAGNOSIS — F419 Anxiety disorder, unspecified: Secondary | ICD-10-CM | POA: Diagnosis not present

## 2016-07-24 DIAGNOSIS — G4700 Insomnia, unspecified: Secondary | ICD-10-CM | POA: Diagnosis not present

## 2016-08-07 ENCOUNTER — Encounter: Payer: Self-pay | Admitting: Internal Medicine

## 2016-08-07 ENCOUNTER — Other Ambulatory Visit (INDEPENDENT_AMBULATORY_CARE_PROVIDER_SITE_OTHER): Payer: 59

## 2016-08-07 ENCOUNTER — Ambulatory Visit (INDEPENDENT_AMBULATORY_CARE_PROVIDER_SITE_OTHER): Payer: 59 | Admitting: Internal Medicine

## 2016-08-07 VITALS — BP 108/74 | HR 80 | Ht 63.75 in | Wt 172.4 lb

## 2016-08-07 DIAGNOSIS — R110 Nausea: Secondary | ICD-10-CM

## 2016-08-07 DIAGNOSIS — R197 Diarrhea, unspecified: Secondary | ICD-10-CM | POA: Diagnosis not present

## 2016-08-07 DIAGNOSIS — R1010 Upper abdominal pain, unspecified: Secondary | ICD-10-CM | POA: Diagnosis not present

## 2016-08-07 LAB — IGA: IgA: 109 mg/dL (ref 68–378)

## 2016-08-07 NOTE — Progress Notes (Signed)
[name] 30 y.o. 10/15/1985 [id number]  Referred by Dr. Donnita FallsWendy Rodriguez  [address] / [place] [phone]  Assessment & Plan:   Encounter Diagnoses  Name Primary?  . Diarrhea, unspecified type Yes  . Nausea without vomiting   . Pain of upper abdomen    Chronic issues - IBS/functional dyspepsia high on list of possibilities Does not have immune deficiency so that not cause   Evaluate with celiac serologic  Testing (TTG Ab IgA is negative) EGD The risks and benefits as well as alternatives of endoscopic procedure(s) have been discussed and reviewed. All questions answered. The patient agrees to proceed.  Consider breath testing for sucrase isomaltase deficiency vs other breath testing pending EGD  I appreciate the opportunity to care for this patient.  GN:FAOZHCc:Wendy Sherlon Handingodriguez, MD    Subjective:   Chief Complaint: abdominal pain, nausea, diarrhea  HPI Very nice single ww, [facility] with years of abdominal pain, nausea and diarrhea - usually pc. Has had some sxs since childhood. Says stress makes it worse. Rics and chicken are safe to eat, had been told to eat fresh or frozen foods-vegetables only when she was a child. Egg-white noodles better than other pasta, noodles.  She has a complicated allergy hx and [facility] - She is on omalizumab (xolair) which has significantly improved her sxs. There is appetite loss and weight loss lately - under stress "relationship and work issues" Wt Readings from Last 3 Encounters:  08/07/16 172 lb 6 oz (78.2 kg)  07/14/16 180 lb (81.6 kg)  06/29/16 186 lb (84.4 kg)   GI sxs do not disturb sleep  Has never had a GI evaluation  10/18/15 [facility] note below   [name] is a 30 yo F I have been evaluating for possible PID in the setting of frequent upper and lower respiratory infections and CIU/dermatographia. As previously noted, she has a h/o lifelong recurrent sinusitits, AR s/p IT x3,  recurrent bronchitis, recurrent tonsillitis, and chronic autoimmune urticaria.   She has not had any intercurrent infections since her previous visit. She is here today in conjunction with an overnight stay for sleep study which occurred last night. I will update the history below by each problem. Her exam is only significant for persistent urticarial rash and profound dermatographism.   1. Immune deficiency evaluation. She returns today for follow-up evaluation of immune disorder. As previously noted her immune deficiency workup was largely normal with normal immunoglobulins. But we were awaiting the results of her immunization with salmonella. That result is now available and demonstrates an increase in titer of >10 which is consistent with a strikingly normal response. We therefore feel comfortable excluding a PID.  2. Recurrent sinopulmonary infections. As previously noted, we were concerned that she may have an anatomic/barrier defect explaining her frequent acute bronchitis/pneumonias. Evaluation with chest CT and full PFTs were unremarkable so we are satisfied that she does not have pulmonary pathology driving lower respiratory infections and suspect that her lower airway symptoms are largely being driven by her recurrent acute sinusitis in the setting of CRS. We have been treating her with NSI with budesonide. However she continues to complain of sinus pain/pressure, profuse purulent drainage, and posterior pharyngeal drainage with cough. I will be eager to see if the omalizumab being offered today for her CIU/dermatographism (see below) has off target effects on her CRS. This has been shown to be very effective in several RCTs for  CRS especially eosinophilic disease (which is likely given her elevated AEC). If she has OSA, CPAP may also help for any component of LPR. If she is not improved we may need to see her again and CASC and determine if either repeat FESS is warranted or alternative  disease-modifying medical interventions should be tried.  3. With her fatigue symptoms along with restless legs, nonrestorative sleep, and SOB she was referred to sleep medicine and she did have an O/N evaluation last night. We await the results of that testing.  4. CIU/dermatographism. This is actually her worst c/o today. As noted she is covered in erythema and raised lesions of urticaria in various stages of evolution and resolution. She has been using high dose antihistamine cocktail clearly without relief. As such I recommended initiation of omalizumab today. I reviewed the indications, expectations and the risk/benefits of this agent including in particular the risk of allergic reactions. She has already an epipen available and is familiar on how and when to use it.   Allergies  Allergen Reactions  . Penicillins Anaphylaxis  . Shrimp [Shellfish Allergy] Anaphylaxis  . Eggs Or Egg-Derived Products     Yolk-nausea  . Wheat Bran Other (See Comments)    inflammation   Outpatient Medications Prior to Visit  Medication Sig Dispense Refill  . hydrOXYzine (ATARAX/VISTARIL) 25 MG tablet Take 75 mg by mouth at bedtime.     Marland Kitchen. ibuprofen (ADVIL,MOTRIN) 800 MG tablet Take 800 mg by mouth every 8 (eight) hours as needed (pain).     . meclizine (ANTIVERT) 25 MG tablet Take 1 tablet (25 mg total) by mouth 3 (three) times daily as needed for dizziness. 30 tablet 0  . clemastine (TAVIST) 2.68 MG TABS tablet Take 2.68 mg by mouth daily.    . fexofenadine (ALLEGRA) 180 MG tablet Take 180 mg by mouth 2 (two) times daily.    . ondansetron (ZOFRAN ODT) 4 MG disintegrating tablet Take 1 tablet (4 mg total) by mouth every 8 (eight) hours as needed for nausea or vomiting. 10 tablet 0  . oxyCODONE-acetaminophen (PERCOCET/ROXICET) 5-325 MG tablet Take 1-2 tablets by mouth every 4 (four) hours as needed for moderate pain.   0  . valACYclovir (VALTREX) 500 MG tablet Take 500 mg by mouth daily.  4   No  facility-administered medications prior to visit.    Past Medical History:  Diagnosis Date  . Aspergillosis (HCC)    sinus  . Chronic idiopathic urticaria   . Dermatographism   . Herpes   . PCOS (polycystic ovarian syndrome)    Past Surgical History:  Procedure Laterality Date  . KNEE SURGERY Right 2007  . NASAL SINUS SURGERY    . TONSILLECTOMY  2008   Social History   Social History  . Marital status: Single    Spouse name: N/A  . Number of children: 1  . Years of education: N/A   Occupational History  . [employer]   Social History Main Topics  . Smoking status: Former Smoker    Packs/day: 1.00    Years: 1.00    Types: Cigarettes    Quit date: 05/24/2016  . Smokeless tobacco: Never Used  . Alcohol use No  . Drug use: No  . Sexual activity: Not Asked   Other Topics Concern  . 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 Allergies Sister   . Allergies Mother   . Asthma Mother   . Allergies Father    Review of Systems As per HPI - fatigue All other negative  Objective:   Physical Exam @BP  108/74   Pulse 80   Ht 5' 3.75" (1.619 m)   Wt 172 lb 6 oz (78.2 kg)   LMP 07/12/2016   BMI 29.82 kg/m @  General:  Well-developed, well-nourished and in no acute distress Eyes:  anicteric. ENT:   Mouth and posterior pharynx free of lesions.  Neck:   supple w/o thyromegaly or mass.  Lungs: Clear to auscultation bilaterally. Heart:  S1S2, no rubs, murmurs, gallops. Abdomen:  soft, non-tender, no hepatosplenomegaly, hernia, or mass and BS+.  Lymph:  no cervical or supraclavicular adenopathy. Extremities:   no edema, cyanosis or clubbing Skin   no rash. Hyperpigmented blanching macule right infrascapular area Neuro:  A&O x 3.  Psych:  appropriate mood and  Affect.   Data Reviewed: See HPI

## 2016-08-07 NOTE — Patient Instructions (Signed)
   You have been scheduled for an endoscopy. Please follow written instructions given to you at your visit today. If you use inhalers (even only as needed), please bring them with you on the day of your procedure. Your physician has requested that you go to www.startemmi.com and enter the access code given to you at your visit today. This web site gives a general overview about your procedure. However, you should still follow specific instructions given to you by our office regarding your preparation for the procedure.     Your physician has requested that you go to the basement for the following lab work before leaving today: TTG, IGA   I appreciate the opportunity to care for you. Stan Headarl Gessner, MD, [facility]

## 2016-08-08 LAB — TISSUE TRANSGLUTAMINASE, IGA: TISSUE TRANSGLUTAMINASE AB, IGA: 1 U/mL (ref <4)

## 2016-08-09 ENCOUNTER — Encounter: Payer: Self-pay | Admitting: Internal Medicine

## 2016-08-12 NOTE — Progress Notes (Signed)
Test for celiac disease is negative

## 2016-08-13 DIAGNOSIS — H6122 Impacted cerumen, left ear: Secondary | ICD-10-CM | POA: Diagnosis not present

## 2016-08-13 DIAGNOSIS — J040 Acute laryngitis: Secondary | ICD-10-CM | POA: Diagnosis not present

## 2016-08-13 DIAGNOSIS — B9789 Other viral agents as the cause of diseases classified elsewhere: Secondary | ICD-10-CM | POA: Diagnosis not present

## 2016-08-16 DIAGNOSIS — B9689 Other specified bacterial agents as the cause of diseases classified elsewhere: Secondary | ICD-10-CM | POA: Diagnosis not present

## 2016-08-16 DIAGNOSIS — R05 Cough: Secondary | ICD-10-CM | POA: Diagnosis not present

## 2016-08-16 DIAGNOSIS — J029 Acute pharyngitis, unspecified: Secondary | ICD-10-CM | POA: Diagnosis not present

## 2016-08-16 DIAGNOSIS — Z719 Counseling, unspecified: Secondary | ICD-10-CM | POA: Diagnosis not present

## 2016-08-16 DIAGNOSIS — J019 Acute sinusitis, unspecified: Secondary | ICD-10-CM | POA: Diagnosis not present

## 2016-08-20 DIAGNOSIS — F419 Anxiety disorder, unspecified: Secondary | ICD-10-CM | POA: Diagnosis not present

## 2016-08-21 DIAGNOSIS — F4310 Post-traumatic stress disorder, unspecified: Secondary | ICD-10-CM | POA: Diagnosis not present

## 2016-08-21 MED FILL — clonazePAM 0.5 MG TABS: 0.5 | 30 days supply | Qty: 60 | Fill #0

## 2016-08-22 DIAGNOSIS — N926 Irregular menstruation, unspecified: Secondary | ICD-10-CM | POA: Diagnosis not present

## 2016-09-05 ENCOUNTER — Ambulatory Visit (AMBULATORY_SURGERY_CENTER): Payer: 59 | Admitting: Internal Medicine

## 2016-09-05 ENCOUNTER — Encounter: Payer: Self-pay | Admitting: Internal Medicine

## 2016-09-05 VITALS — BP 102/58 | HR 75 | Temp 98.2°F | Resp 18 | Ht 63.75 in | Wt 172.0 lb

## 2016-09-05 DIAGNOSIS — R197 Diarrhea, unspecified: Secondary | ICD-10-CM

## 2016-09-05 DIAGNOSIS — R110 Nausea: Secondary | ICD-10-CM | POA: Diagnosis present

## 2016-09-05 DIAGNOSIS — K2980 Duodenitis without bleeding: Secondary | ICD-10-CM

## 2016-09-05 DIAGNOSIS — R112 Nausea with vomiting, unspecified: Secondary | ICD-10-CM | POA: Diagnosis not present

## 2016-09-05 MED ORDER — SODIUM CHLORIDE 0.9 % IV SOLN: 500.0000 mL | INTRAVENOUS | Status: DC

## 2016-09-05 NOTE — Patient Instructions (Addendum)
   There was slight inflammation in the duodenum (small intestine).  I took biopsies and will let you know. Will not be able to let you know til 12/26 or later most likely.  I appreciate the opportunity to care for you. Iva Booparl E. Gessner, MD, FACG   YOU HAD AN ENDOSCOPIC PROCEDURE TODAY AT THE [facility]:   Refer to the procedure report that was given to you for any specific questions about what was found during the examination.  If the procedure report does not answer your questions, please call your gastroenterologist to clarify.  If you requested that your care partner not be given the details of your procedure findings, then the procedure report has been included in a sealed envelope for you to review at your convenience later.  YOU SHOULD EXPECT: Some feelings of bloating in the abdomen. Passage of more gas than usual.  Walking can help get rid of the air that was put into your GI tract during the procedure and reduce the bloating.  Please Note:  You might notice some irritation and congestion in your nose or some drainage.  This is from the oxygen used during your procedure.  There is no need for concern and it should clear up in a day or so.  SYMPTOMS TO REPORT IMMEDIATELY:   Following upper endoscopy (EGD)  Vomiting of blood or coffee ground material  New chest pain or pain under the shoulder blades  Painful or persistently difficult swallowing  New shortness of breath  Fever of 100F or higher  Black, tarry-looking stools  For urgent or emergent issues, a gastroenterologist can be reached at any hour by calling [phone].   DIET:  We do recommend a small meal at first, but then you may proceed to your regular diet.  Drink plenty of fluids but you should avoid alcoholic beverages for 24 hours.  ACTIVITY:  You should plan to take it easy for the rest of today and you should NOT DRIVE or use heavy machinery until tomorrow (because of the sedation medicines  used during the test).    FOLLOW UP: Our staff will call the number listed on your records the next business day following your procedure to check on you and address any questions or concerns that you may have regarding the information given to you following your procedure. If we do not reach you, we will leave a message.  However, if you are feeling well and you are not experiencing any problems, there is no need to return our call.  We will assume that you have returned to your regular daily activities without incident.  If any biopsies were taken you will be contacted by phone or by letter within the next 1-3 weeks.  Please call us at [phone] if you have not heard about the biopsies in 3 weeks.    SIGNATURES/CONFIDENTIALITY: You and/or your care partner have signed paperwork which will be entered into your electronic medical record.  These signatures attest to the fact that that the information above on your After Visit Summary has been reviewed and is understood.  Full responsibility of the confidentiality of this discharge information lies with you and/or your care-partner.  Read all discharge instructions given to you by your recovery room nurse.   Your pathology results will be mailed to you after Christmas, because your doctor will be out of town next week.

## 2016-09-05 NOTE — Progress Notes (Signed)
Called to room to assist during endoscopic procedure.  Patient ID and intended procedure confirmed with present staff. Received instructions for my participation in the procedure from the performing physician.  

## 2016-09-05 NOTE — Op Note (Signed)
[facility] Patient Name: Alexandra BoyerHeather Estes Procedure Date: 09/05/2016 10:24 AM MRN: [id number] Endoscopist: Iva Booparl E Gessner , MD Age: 30 Referring MD:  Date of Birth: 12/15/1985 Gender: Female Account #: [id number] Procedure:                Upper GI endoscopy Indications:              Nausea Medicines:                Propofol per Anesthesia, Monitored Anesthesia Care Procedure:                Pre-Anesthesia Assessment:                           - Prior to the procedure, a History and Physical                            was performed, and patient medications and                            allergies were reviewed. The patient's tolerance of                            previous anesthesia was also reviewed. The risks                            and benefits of the procedure and the sedation                            options and risks were discussed with the patient.                            All questions were answered, and informed consent                            was obtained. Prior Anticoagulants: The patient has                            taken no previous anticoagulant or antiplatelet                            agents. ASA Grade Assessment: II - A patient with                            mild systemic disease. After reviewing the risks                            and benefits, the patient was deemed in                            satisfactory condition to undergo the procedure.                           After obtaining informed consent, the endoscope was  passed under direct vision. Throughout the                            procedure, the patient's blood pressure, pulse, and                            oxygen saturations were monitored continuously. The                            Model GIF-[id number]) scope was introduced                            through the mouth, and advanced to the second part                            of duodenum. The  upper GI endoscopy was                            accomplished without difficulty. The patient                            tolerated the procedure well. Scope In: Scope Out: Findings:                 Patchy mild inflammation characterized by erosions                            and erythema was found in the duodenal bulb.                            Biopsies were taken with a cold forceps for                            histology. Verification of patient identification                            for the specimen was done. Estimated blood loss was                            minimal.                           The exam was otherwise without abnormality.                           The cardia and gastric fundus were normal on                            retroflexion. Complications:            No immediate complications. Estimated Blood Loss:     Estimated blood loss was minimal. Impression:               - Duodenitis. Biopsied.                           - The  examination was otherwise normal. Recommendation:           - Patient has a contact number available for                            emergencies. The signs and symptoms of potential                            delayed complications were discussed with the                            patient. Return to normal activities tomorrow.                            Written discharge instructions were provided to the                            patient.                           - Resume previous diet.                           - Continue present medications.                           - Await pathology results. Iva Booparl E Gessner, MD 09/05/2016 10:46:22 AM This report has been signed electronically.

## 2016-09-05 NOTE — Progress Notes (Signed)
Patient awakening,vss,report to rn 

## 2016-09-06 ENCOUNTER — Encounter (HOSPITAL_COMMUNITY): Payer: Self-pay | Admitting: *Deleted

## 2016-09-06 ENCOUNTER — Inpatient Hospital Stay (HOSPITAL_COMMUNITY)
Admission: AD | Admit: 2016-09-06 | Discharge: 2016-09-06 | Disposition: A | Discharge status: Home / Self Care | Payer: 59 | Source: Ambulatory Visit | Attending: Obstetrics & Gynecology | Admitting: Obstetrics & Gynecology

## 2016-09-06 ENCOUNTER — Telehealth: Payer: Self-pay | Admitting: *Deleted

## 2016-09-06 ENCOUNTER — Inpatient Hospital Stay (HOSPITAL_COMMUNITY): Payer: 59

## 2016-09-06 DIAGNOSIS — B9689 Other specified bacterial agents as the cause of diseases classified elsewhere: Secondary | ICD-10-CM | POA: Diagnosis not present

## 2016-09-06 DIAGNOSIS — N760 Acute vaginitis: Secondary | ICD-10-CM | POA: Insufficient documentation

## 2016-09-06 DIAGNOSIS — R102 Pelvic and perineal pain: Secondary | ICD-10-CM | POA: Diagnosis present

## 2016-09-06 DIAGNOSIS — N83201 Unspecified ovarian cyst, right side: Secondary | ICD-10-CM

## 2016-09-06 LAB — CBC WITH DIFFERENTIAL/PLATELET
BASOS ABS: 0.1 10*3/uL (ref 0.0–0.1)
BASOS PCT: 0 %
Eosinophils Absolute: 0.3 10*3/uL (ref 0.0–0.7)
Eosinophils Relative: 2 %
HEMATOCRIT: 41.5 % (ref 36.0–46.0)
HEMOGLOBIN: 14.3 g/dL (ref 12.0–15.0)
LYMPHS PCT: 24 %
Lymphs Abs: 4.2 10*3/uL — ABNORMAL HIGH (ref 0.7–4.0)
MCH: 30.5 pg (ref 26.0–34.0)
MCHC: 34.5 g/dL (ref 30.0–36.0)
MCV: 88.5 fL (ref 78.0–100.0)
MONOS PCT: 3 %
Monocytes Absolute: 0.6 10*3/uL (ref 0.1–1.0)
NEUTROS ABS: 11.9 10*3/uL — AB (ref 1.7–7.7)
Neutrophils Relative %: 71 %
PLATELETS: 251 10*3/uL (ref 150–400)
RBC: 4.69 MIL/uL (ref 3.87–5.11)
RDW: 13.4 % (ref 11.5–15.5)
WBC: 17 10*3/uL — ABNORMAL HIGH (ref 4.0–10.5)

## 2016-09-06 LAB — URINALYSIS, ROUTINE W REFLEX MICROSCOPIC
BILIRUBIN URINE: NEGATIVE
Glucose, UA: NEGATIVE mg/dL
KETONES UR: NEGATIVE mg/dL
NITRITE: NEGATIVE
Protein, ur: NEGATIVE mg/dL
SPECIFIC GRAVITY, URINE: 1.023 (ref 1.005–1.030)
pH: 6 (ref 5.0–8.0)

## 2016-09-06 LAB — WET PREP, GENITAL
Sperm: NONE SEEN
Trich, Wet Prep: NONE SEEN
Yeast Wet Prep HPF POC: NONE SEEN

## 2016-09-06 LAB — POCT PREGNANCY, URINE: PREG TEST UR: NEGATIVE

## 2016-09-06 MED ORDER — METRONIDAZOLE 500 MG PO TABS: 500.0000 mg | ORAL_TABLET | Freq: Two times a day (BID) | ORAL | 0 refills | Status: DC

## 2016-09-06 MED ORDER — KETOROLAC TROMETHAMINE 60 MG/2ML IM SOLN
60.0000 mg | Freq: Once | INTRAMUSCULAR | Status: AC
  Administered 2016-09-06: 60 mg via INTRAMUSCULAR
  Filled 2016-09-06: qty 2

## 2016-09-06 NOTE — MAU Note (Signed)
Pt presents to MAU with complaints of pain in her ovaries more on the right side than the left. States she did have an endoscopy yesterday and has had pain since. Pt called office and was not able to get an appointment until the end of the month. PT denies any vaginal bleeding or abnormal discharge

## 2016-09-06 NOTE — MAU Provider Note (Signed)
History     CSN: [id number]  Arrival date and time: 09/06/16 1810   First Provider Initiated Contact with Patient 09/06/16 2050      Chief Complaint  Patient presents with  . pain   HPI [name] is a 30 y.o. G1P0101 who presents to MAU today with complaint of ovarian pain worse on the right then left. The patient called the office when this started yesterday around 4 pm and was told to try heat and Ibuprofen. She has done this throughout the day today without relief. She rates her pain at 9/10 now. She states mild associated nausea without vomiting, diarrhea or constipation. She denies fever, vaginal bleeding, discharge or UTI symptoms. She has a paragard IUD in place. She states a history of ovarian cysts.   OB History    Gravida Para Term Preterm AB Living   1 1   1   1    SAB TAB Ectopic Multiple Live Births           1      Past Medical History:  Diagnosis Date  . Allergy    seasonal  . Anxiety   . Aspergillosis (HCC)    sinus  . Chronic idiopathic urticaria   . Dermatographism   . Herpes   . PCOS (polycystic ovarian syndrome)     Past Surgical History:  Procedure Laterality Date  . KNEE SURGERY Right 2007  . NASAL SINUS SURGERY    . TONSILLECTOMY  2008    Family History  Problem Relation Age of Onset  . Allergies Sister   . Allergies Mother   . Asthma Mother   . Allergies Father     Social History  Substance Use Topics  . Smoking status: Former Smoker    Packs/day: 1.00    Years: 1.00    Types: Cigarettes    Quit date: 05/24/2016  . Smokeless tobacco: Never Used  . Alcohol use No    Allergies:  Allergies  Allergen Reactions  . Penicillins Anaphylaxis    Has patient had a PCN reaction causing immediate rash, facial/tongue/throat swelling, SOB or lightheadedness with hypotension: yes Has patient had a PCN reaction causing severe rash involving mucus membranes or skin necrosis: unknown Has patient had a PCN reaction that required  hospitalization no Has patient had a PCN reaction occurring within the last 10 years: yes If all of the above answers are "NO", then may proceed with Cephalosporin use.   . Shrimp [Shellfish Allergy] Anaphylaxis  . Eggs Or Egg-Derived Products     Yolk-nausea  . Wheat Bran Other (See Comments)    inflammation    No prescriptions prior to admission.    Review of Systems  Constitutional: Negative for fever and malaise/fatigue.  Gastrointestinal: Positive for abdominal pain and nausea. Negative for constipation, diarrhea and vomiting.  Genitourinary: Negative for dysuria, frequency and urgency.       Neg - vaginal bleeding, discharge   Physical Exam   Blood pressure 125/87, pulse 92, temperature 98.2 F (36.8 C), resp. rate 18, height 5\' 4"  (1.626 m), weight 179 lb (81.2 kg), last menstrual period 07/12/2016, SpO2 100 %.  Physical Exam  Nursing note and vitals reviewed. Constitutional: She is oriented to person, place, and time. She appears well-developed and well-nourished. No distress.  HENT:  Head: Normocephalic and atraumatic.  Cardiovascular: Normal rate.   Respiratory: Effort normal.  GI: Soft. She exhibits no distension and no mass. There is tenderness (mild tenderness to palpation). There  is no rebound and no guarding.  Genitourinary: Uterus is not enlarged and not tender. Cervix exhibits no motion tenderness. Right adnexum displays tenderness (mild). Right adnexum displays no mass. Left adnexum displays no mass and no tenderness. No bleeding in the vagina. Vaginal discharge (scant white) found.  Neurological: She is alert and oriented to person, place, and time.  Skin: Skin is warm and dry. No erythema.  Psychiatric: She has a normal mood and affect.    Results for orders placed or performed during the hospital encounter of 09/06/16 (from the past 24 hour(s))  Urinalysis, Routine w reflex microscopic     Status: Abnormal   Collection Time: 09/06/16  6:44 PM  Result  Value Ref Range   Color, Urine YELLOW YELLOW   APPearance HAZY (A) CLEAR   Specific Gravity, Urine 1.023 1.005 - 1.030   pH 6.0 5.0 - 8.0   Glucose, UA NEGATIVE NEGATIVE mg/dL   Hgb urine dipstick SMALL (A) NEGATIVE   Bilirubin Urine NEGATIVE NEGATIVE   Ketones, ur NEGATIVE NEGATIVE mg/dL   Protein, ur NEGATIVE NEGATIVE mg/dL   Nitrite NEGATIVE NEGATIVE   Leukocytes, UA LARGE (A) NEGATIVE   RBC / HPF 0-5 0 - 5 RBC/hpf   WBC, UA 6-30 0 - 5 WBC/hpf   Bacteria, UA RARE (A) NONE SEEN   Squamous Epithelial / LPF 0-5 (A) NONE SEEN   Mucous PRESENT   Pregnancy, urine POC     Status: None   Collection Time: 09/06/16  6:55 PM  Result Value Ref Range   Preg Test, Ur NEGATIVE NEGATIVE  Wet prep, genital     Status: Abnormal   Collection Time: 09/06/16  9:18 PM  Result Value Ref Range   Yeast Wet Prep HPF POC NONE SEEN NONE SEEN   Trich, Wet Prep NONE SEEN NONE SEEN   Clue Cells Wet Prep HPF POC PRESENT (A) NONE SEEN   WBC, Wet Prep HPF POC FEW (A) NONE SEEN   Sperm NONE SEEN   CBC with Differential/Platelet     Status: Abnormal   Collection Time: 09/06/16  9:46 PM  Result Value Ref Range   WBC 17.0 (H) 4.0 - 10.5 K/uL   RBC 4.69 3.87 - 5.11 MIL/uL   Hemoglobin 14.3 12.0 - 15.0 g/dL   HCT 95.641.5 21.336.0 - 08.646.0 %   MCV 88.5 78.0 - 100.0 fL   MCH 30.5 26.0 - 34.0 pg   MCHC 34.5 30.0 - 36.0 g/dL   RDW 57.813.4 46.911.5 - 62.915.5 %   Platelets 251 150 - 400 K/uL   Neutrophils Relative % 71 %   Neutro Abs 11.9 (H) 1.7 - 7.7 K/uL   Lymphocytes Relative 24 %   Lymphs Abs 4.2 (H) 0.7 - 4.0 K/uL   Monocytes Relative 3 %   Monocytes Absolute 0.6 0.1 - 1.0 K/uL   Eosinophils Relative 2 %   Eosinophils Absolute 0.3 0.0 - 0.7 K/uL   Basophils Relative 0 %   Basophils Absolute 0.1 0.0 - 0.1 K/uL   [place] Transvaginal Non-ob  Result Date: 09/06/2016 CLINICAL DATA:  Initial evaluation for acute adnexal pain, right greater than left. EXAM: TRANSVAGINAL ULTRASOUND OF PELVIS TECHNIQUE: Transvaginal ultrasound  examination of the pelvis was performed. COMPARISON:  Prior ultrasound from 06/29/2016. FINDINGS: Uterus Measurements: 8.5 x 4.8 x 5.2 cm. No fibroids or other mass visualized. Endometrium Thickness: 11 mm.  IUD in place within the endometrial canal. Right ovary Measurements: 5.3 x 4.7 x 4.8 cm. Complex cyst measuring 3.8  x 2.8 x 3.9 cm, most compatible with a hemorrhagic cyst. Left ovary Measurements: 2.6 x 3.0 x 3.1 cm. Normal appearance/no adnexal mass. Other findings No abnormal free fluid. IMPRESSION: 1. 3.8 x 2.8 x 3.9 cm complex right ovarian cyst, most compatible with a hemorrhagic cyst. 2. IUD in appropriate position within the endometrial canal. 3. Otherwise unremarkable pelvic ultrasound. Electronically Signed   By: Rise MuBenjamin  McClintock M.D.   On: 09/06/2016 22:58    MAU Course  Procedures None  MDM UA, wet prep, CBC today  60 mg toradol IM given. Patient reports improvement in pain.  Discussed patient and lab results with Dr. Richardson Doppole. Recommends US today.  US ordered.  Discussed US results with Dr. Richardson Doppole. Advised to follow-up in the office.   Assessment and Plan  A: Bacterial Vaginosis Right ovarian cyst   P: Discharge home Rx for Flagyl sent to patient's pharmacy  Warning signs for ovarian torsion discussed Patient advised to follow-up with [facility] to ensure resolution of ovarian cyst Patient may return to MAU as needed or if her condition were to change or worsen  Marny LowensteinJulie N Beni Turrell, PA-C  09/07/2016, 12:57 AM

## 2016-09-06 NOTE — Telephone Encounter (Signed)

## 2016-09-06 NOTE — Discharge Instructions (Signed)
Ovarian Cyst °An ovarian cyst is a fluid-filled sac on an ovary. The ovaries are organs that make eggs in women. Most ovarian cysts go away on their own and are not cancerous (are benign). Some cysts need treatment. °Follow these instructions at home: °· Take over-the-counter and prescription medicines only as told by your doctor. °· Do not drive or use heavy machinery while taking prescription pain medicine. °· Get pelvic exams and Pap tests as often as told by your doctor. °· Return to your normal activities as told by your doctor. Ask your doctor what activities are safe for you. °· Do not use any products that contain nicotine or tobacco, such as cigarettes and e-cigarettes. If you need help quitting, ask your doctor. °· Keep all follow-up visits as told by your doctor. This is important. °Contact a doctor if: °· Your periods are: °¨ Late. °¨ Irregular. °¨ Painful. °· Your periods stop. °· You have pelvic pain that does not go away. °· You have pressure on your bladder. °· You have trouble making your bladder empty when you pee (urinate). °· You have pain during sex. °· You have any of the following in your belly (abdomen): °¨ A feeling of fullness. °¨ Pressure. °¨ Discomfort. °¨ Pain that does not go away. °¨ Swelling. °· You feel sick most of the time. °· You have trouble pooping (have constipation). °· You are not as hungry as usual (you lose your appetite). °· You get very bad acne. °· You start to have more hair on your body and face. °· You are gaining weight or losing weight without changing your exercise and eating habits. °· You think you may be pregnant. °Get help right away if: °· You have belly pain that is very bad or gets worse. °· You cannot eat or drink without throwing up (vomiting). °· You suddenly get a fever. °· Your period is a lot heavier than usual. °This information is not intended to replace advice given to you by your health care provider. Make sure you discuss any questions you have  with your health care provider. °Document Released: 02/26/2008 Document Revised: 03/29/2016 Document Reviewed: 02/11/2016 °Elsevier Interactive Patient Education © 2017 [employer] Inc[employer] ° °

## 2016-09-08 LAB — URINE CULTURE

## 2016-09-09 ENCOUNTER — Inpatient Hospital Stay (HOSPITAL_COMMUNITY): Payer: 59

## 2016-09-09 ENCOUNTER — Inpatient Hospital Stay (HOSPITAL_COMMUNITY)
Admission: AD | Admit: 2016-09-09 | Discharge: 2016-09-09 | Disposition: A | Discharge status: Home / Self Care | Payer: 59 | Source: Ambulatory Visit | Attending: Obstetrics and Gynecology | Admitting: Obstetrics and Gynecology

## 2016-09-09 ENCOUNTER — Encounter (HOSPITAL_COMMUNITY): Payer: Self-pay | Admitting: *Deleted

## 2016-09-09 DIAGNOSIS — Z87891 Personal history of nicotine dependence: Secondary | ICD-10-CM | POA: Diagnosis not present

## 2016-09-09 DIAGNOSIS — Z975 Presence of (intrauterine) contraceptive device: Secondary | ICD-10-CM | POA: Insufficient documentation

## 2016-09-09 DIAGNOSIS — R112 Nausea with vomiting, unspecified: Secondary | ICD-10-CM | POA: Diagnosis present

## 2016-09-09 DIAGNOSIS — R1031 Right lower quadrant pain: Secondary | ICD-10-CM | POA: Diagnosis not present

## 2016-09-09 DIAGNOSIS — F419 Anxiety disorder, unspecified: Secondary | ICD-10-CM | POA: Insufficient documentation

## 2016-09-09 DIAGNOSIS — N83201 Unspecified ovarian cyst, right side: Secondary | ICD-10-CM | POA: Diagnosis not present

## 2016-09-09 DIAGNOSIS — N83209 Unspecified ovarian cyst, unspecified side: Secondary | ICD-10-CM

## 2016-09-09 DIAGNOSIS — Z880 Allergy status to penicillin: Secondary | ICD-10-CM | POA: Diagnosis not present

## 2016-09-09 LAB — CBC WITH DIFFERENTIAL/PLATELET
BASOS ABS: 0.1 10*3/uL (ref 0.0–0.1)
BASOS PCT: 0 %
Eosinophils Absolute: 0.2 10*3/uL (ref 0.0–0.7)
Eosinophils Relative: 2 %
HCT: 38.6 % (ref 36.0–46.0)
Hemoglobin: 13.3 g/dL (ref 12.0–15.0)
Lymphocytes Relative: 21 %
Lymphs Abs: 2.5 10*3/uL (ref 0.7–4.0)
MCH: 30.6 pg (ref 26.0–34.0)
MCHC: 34.5 g/dL (ref 30.0–36.0)
MCV: 88.9 fL (ref 78.0–100.0)
MONO ABS: 0.4 10*3/uL (ref 0.1–1.0)
Monocytes Relative: 3 %
Neutro Abs: 8.9 10*3/uL — ABNORMAL HIGH (ref 1.7–7.7)
Neutrophils Relative %: 74 %
PLATELETS: 227 10*3/uL (ref 150–400)
RBC: 4.34 MIL/uL (ref 3.87–5.11)
RDW: 13.6 % (ref 11.5–15.5)
WBC: 12.1 10*3/uL — ABNORMAL HIGH (ref 4.0–10.5)

## 2016-09-09 LAB — URINALYSIS, ROUTINE W REFLEX MICROSCOPIC
BILIRUBIN URINE: NEGATIVE
GLUCOSE, UA: NEGATIVE mg/dL
KETONES UR: NEGATIVE mg/dL
NITRITE: NEGATIVE
PROTEIN: 30 mg/dL — AB
Specific Gravity, Urine: 1.031 — ABNORMAL HIGH (ref 1.005–1.030)
pH: 5 (ref 5.0–8.0)

## 2016-09-09 LAB — POCT PREGNANCY, URINE: Preg Test, Ur: NEGATIVE

## 2016-09-09 MED ORDER — KETOROLAC TROMETHAMINE 30 MG/ML IJ SOLN
30.0000 mg | Freq: Once | INTRAMUSCULAR | Status: AC
  Administered 2016-09-09: 30 mg via INTRAVENOUS
  Filled 2016-09-09: qty 1

## 2016-09-09 MED ORDER — HYDROMORPHONE HCL 1 MG/ML IJ SOLN
1.0000 mg | Freq: Once | INTRAMUSCULAR | Status: AC
  Administered 2016-09-09: 1 mg via INTRAVENOUS
  Filled 2016-09-09: qty 1

## 2016-09-09 MED ORDER — IOPAMIDOL (ISOVUE-300) INJECTION 61%
30.0000 mL | INTRAVENOUS | Status: AC
  Administered 2016-09-09: 30 mL via ORAL

## 2016-09-09 MED ORDER — IOPAMIDOL (ISOVUE-300) INJECTION 61%
100.0000 mL | Freq: Once | INTRAVENOUS | Status: AC | PRN
  Administered 2016-09-09: 100 mL via INTRAVENOUS

## 2016-09-09 MED ORDER — HYDROMORPHONE HCL 2 MG PO TABS: 2.0000 mg | ORAL_TABLET | ORAL | 0 refills | Status: DC | PRN

## 2016-09-09 MED ORDER — LACTATED RINGERS IV BOLUS (SEPSIS)
1000.0000 mL | Freq: Once | INTRAVENOUS | Status: AC
  Administered 2016-09-09: 1000 mL via INTRAVENOUS

## 2016-09-09 MED ORDER — ONDANSETRON 8 MG PO TBDP: 8.0000 mg | ORAL_TABLET | Freq: Three times a day (TID) | ORAL | 0 refills | Status: DC | PRN

## 2016-09-09 MED ORDER — ONDANSETRON HCL 4 MG/2ML IJ SOLN
4.0000 mg | Freq: Once | INTRAMUSCULAR | Status: AC
  Administered 2016-09-09: 4 mg via INTRAVENOUS
  Filled 2016-09-09: qty 2

## 2016-09-09 NOTE — Discharge Instructions (Signed)
Ovarian Cyst  An ovarian cyst is a fluid-filled sac that forms on an ovary. The ovaries are small organs that produce eggs in women. Various types of cysts can form on the ovaries. Some may cause symptoms and require treatment. Most ovarian cysts go away on their own, are not cancerous (are benign), and do not cause problems. Common types of ovarian cysts include:  Functional (follicle) cysts.  Occur during the menstrual cycle, and usually go away with the next menstrual cycle if you do not get pregnant.  Usually cause no symptoms.  Endometriomas.  Are cysts that form from the tissue that lines the uterus (endometrium).  Are sometimes called "chocolate cysts" because they become filled with blood that turns brown.  Can cause pain in the lower abdomen during intercourse and during your period.  Cystadenoma cysts.  Develop from cells on the outside surface of the ovary.  Can get very large and cause lower abdomen pain and pain with intercourse.  Can cause severe pain if they twist or break open (rupture).  Dermoid cysts.  Are sometimes found in both ovaries.  May contain different kinds of body tissue, such as skin, teeth, hair, or cartilage.  Usually do not cause symptoms unless they get very big.  Theca lutein cysts.  Occur when too much of a certain hormone (human chorionic gonadotropin) is produced and overstimulates the ovaries to produce an egg.  Are most common after having procedures used to assist with the conception of a baby (in vitro fertilization). What are the causes? Ovarian cysts may be caused by:  Ovarian hyperstimulation syndrome. This is a condition that can develop from taking fertility medicines. It causes multiple large ovarian cysts to form.  Polycystic ovarian syndrome (PCOS). This is a common hormonal disorder that can cause ovarian cysts, as well as problems with your period or fertility. What increases the risk? The following factors may make you  more likely to develop ovarian cysts:  Being overweight or obese.  Taking fertility medicines.  Taking certain forms of hormonal birth control.  Smoking. What are the signs or symptoms? Many ovarian cysts do not cause symptoms. If symptoms are present, they may include:  Pelvic pain or pressure.  Pain in the lower abdomen.  Pain during sex.  Abdominal swelling.  Abnormal menstrual periods.  Increasing pain with menstrual periods. How is this diagnosed? These cysts are commonly found during a routine pelvic exam. You may have tests to find out more about the cyst, such as:  Ultrasound.  X-ray of the pelvis.  CT scan.  MRI.  Blood tests. How is this treated? Many ovarian cysts go away on their own without treatment. Your health care provider may want to check your cyst regularly for 2-3 months to see if it changes. If you are in menopause, it is especially important to have your cyst monitored closely because menopausal women have a higher rate of ovarian cancer. When treatment is needed, it may include:  Medicines to help relieve pain.  A procedure to drain the cyst (aspiration).  Surgery to remove the whole cyst.  Hormone treatment or birth control pills. These methods are sometimes used to help dissolve a cyst. Follow these instructions at home:  Take over-the-counter and prescription medicines only as told by your health care provider.  Do not drive or use heavy machinery while taking prescription pain medicine.  Get regular pelvic exams and Pap tests as often as told by your health care provider.  Return to your   normal activities as told by your health care provider. Ask your health care provider what activities are safe for you.  Do not use any products that contain nicotine or tobacco, such as cigarettes and e-cigarettes. If you need help quitting, ask your health care provider.  Keep all follow-up visits as told by your health care provider. This is  important. Contact a health care provider if:  Your periods are late, irregular, or painful, or they stop.  You have pelvic pain that does not go away.  You have pressure on your bladder or trouble emptying your bladder completely.  You have pain during sex.  You have any of the following in your abdomen:  A feeling of fullness.  Pressure.  Discomfort.  Pain that does not go away.  Swelling.  You feel generally ill.  You become constipated.  You lose your appetite.  You develop severe acne.  You start to have more body hair and facial hair.  You are gaining weight or losing weight without changing your exercise and eating habits.  You think you may be pregnant. Get help right away if:  You have abdominal pain that is severe or gets worse.  You cannot eat or drink without vomiting.  You suddenly develop a fever.  Your menstrual period is much heavier than usual. This information is not intended to replace advice given to you by your health care provider. Make sure you discuss any questions you have with your health care provider. Document Released: 09/09/2005 Document Revised: 03/29/2016 Document Reviewed: 02/11/2016 Elsevier Interactive Patient Education  2017 [employer].  

## 2016-09-09 NOTE — MAU Provider Note (Signed)
History     CSN: [id number]  Arrival date and time: 09/09/16 1615   None     Chief Complaint  Patient presents with  . Abdominal Pain  . Emesis   HPI Ms. Lenox PondsHeather M Estes is a 30 y.o. G1P0101 who presents to MAU today with complaint of increased RLQ abdominal pain and N/V. The patient was seen in MAU on Friday night with new onset RLQ abdominal pain. She was diagnosed with an ovarian cyst at that time. She states that since last night she has noted increased pain. She rates her pain "over a 10" at this time. She states new onset N/V today as well. She denies fever, but endorses chills. She has noted a small amount of vaginal spotting. She has IUD in place. Sh has been taking Motrin for pain, last dose ~ 4 hours prior to arrival in MAU.    OB History    Gravida Para Term Preterm AB Living   1 1   1   1    SAB TAB Ectopic Multiple Live Births           1      Past Medical History:  Diagnosis Date  . Allergy    seasonal  . Anxiety   . Aspergillosis (HCC)    sinus  . Chronic idiopathic urticaria   . Dermatographism   . Herpes   . PCOS (polycystic ovarian syndrome)     Past Surgical History:  Procedure Laterality Date  . KNEE SURGERY Right 2007  . NASAL SINUS SURGERY    . TONSILLECTOMY  2008    Family History  Problem Relation Age of Onset  . Allergies Sister   . Allergies Mother   . Asthma Mother   . Allergies Father     Social History  Substance Use Topics  . Smoking status: Former Smoker    Packs/day: 1.00    Years: 1.00    Types: Cigarettes    Quit date: 05/24/2016  . Smokeless tobacco: Never Used  . Alcohol use No    Allergies:  Allergies  Allergen Reactions  . Penicillins Anaphylaxis    Has patient had a PCN reaction causing immediate rash, facial/tongue/throat swelling, SOB or lightheadedness with hypotension: yes Has patient had a PCN reaction causing severe rash involving mucus membranes or skin necrosis: unknown Has patient had a PCN reaction  that required hospitalization no Has patient had a PCN reaction occurring within the last 10 years: yes If all of the above answers are "NO", then may proceed with Cephalosporin use.   . Shrimp [Shellfish Allergy] Anaphylaxis  . Eggs Or Egg-Derived Products     Yolk-nausea  . Soy Allergy Nausea Only  . Wheat Bran Other (See Comments)    inflammation    Prescriptions Prior to Admission  Medication Sig Dispense Refill Last Dose  . citalopram (CELEXA) 20 MG tablet Take 30 mg by mouth daily.   09/05/2016 at Unknown time  . clonazePAM (KLONOPIN) 0.5 MG tablet Take 0.5 mg by mouth every evening.   09/05/2016 at Unknown time  . hydrOXYzine (ATARAX/VISTARIL) 25 MG tablet Take 75 mg by mouth at bedtime.    09/05/2016 at Unknown time  . ibuprofen (ADVIL,MOTRIN) 800 MG tablet Take 800 mg by mouth every 8 (eight) hours as needed (pain).    09/06/2016 at Unknown time  . meclizine (ANTIVERT) 25 MG tablet Take 1 tablet (25 mg total) by mouth 3 (three) times daily as needed for dizziness. 30 tablet 0 Past  Month at Unknown time  . metroNIDAZOLE (FLAGYL) 500 MG tablet Take 1 tablet (500 mg total) by mouth 2 (two) times daily. 14 tablet 0     Review of Systems  Constitutional: Positive for chills. Negative for fever and malaise/fatigue.  Gastrointestinal: Positive for abdominal pain, nausea and vomiting. Negative for constipation and diarrhea.  Genitourinary: Negative for dysuria, frequency and urgency.       + spotting   Physical Exam   Blood pressure 130/65, pulse 83, temperature 97.5 F (36.4 C), temperature source Oral, resp. rate 20.  Physical Exam  Nursing note and vitals reviewed. Constitutional: She is oriented to person, place, and time. She appears well-developed and well-nourished. No distress.  HENT:  Head: Normocephalic and atraumatic.  Cardiovascular: Normal rate.   Respiratory: Effort normal.  GI: Soft. She exhibits no distension and no mass. There is tenderness (moderate  tenderness to palpation of the lower abdomen more prominent on the right then left). There is no rebound and no guarding.  Neurological: She is alert and oriented to person, place, and time.  Skin: Skin is warm and dry. No erythema.  Psychiatric: She has a normal mood and affect.    Results for orders placed or performed during the hospital encounter of 09/09/16 (from the past 24 hour(s))  Urinalysis, Routine w reflex microscopic     Status: Abnormal   Collection Time: 09/09/16  4:35 PM  Result Value Ref Range   Color, Urine YELLOW YELLOW   APPearance HAZY (A) CLEAR   Specific Gravity, Urine 1.031 (H) 1.005 - 1.030   pH 5.0 5.0 - 8.0   Glucose, UA NEGATIVE NEGATIVE mg/dL   Hgb urine dipstick SMALL (A) NEGATIVE   Bilirubin Urine NEGATIVE NEGATIVE   Ketones, ur NEGATIVE NEGATIVE mg/dL   Protein, ur 30 (A) NEGATIVE mg/dL   Nitrite NEGATIVE NEGATIVE   Leukocytes, UA SMALL (A) NEGATIVE   RBC / HPF 0-5 0 - 5 RBC/hpf   WBC, UA 0-5 0 - 5 WBC/hpf   Bacteria, UA RARE (A) NONE SEEN   Squamous Epithelial / LPF 0-5 (A) NONE SEEN   Mucous PRESENT    Hyaline Casts, UA PRESENT   Pregnancy, urine POC     Status: None   Collection Time: 09/09/16  4:41 PM  Result Value Ref Range   Preg Test, Ur NEGATIVE NEGATIVE  CBC with Differential/Platelet     Status: Abnormal   Collection Time: 09/09/16  5:15 PM  Result Value Ref Range   WBC 12.1 (H) 4.0 - 10.5 K/uL   RBC 4.34 3.87 - 5.11 MIL/uL   Hemoglobin 13.3 12.0 - 15.0 g/dL   HCT 19.138.6 47.836.0 - 29.546.0 %   MCV 88.9 78.0 - 100.0 fL   MCH 30.6 26.0 - 34.0 pg   MCHC 34.5 30.0 - 36.0 g/dL   RDW 62.113.6 30.811.5 - 65.715.5 %   Platelets 227 150 - 400 K/uL   Neutrophils Relative % 74 %   Neutro Abs 8.9 (H) 1.7 - 7.7 K/uL   Lymphocytes Relative 21 %   Lymphs Abs 2.5 0.7 - 4.0 K/uL   Monocytes Relative 3 %   Monocytes Absolute 0.4 0.1 - 1.0 K/uL   Eosinophils Relative 2 %   Eosinophils Absolute 0.2 0.0 - 0.7 K/uL   Basophils Relative 0 %   Basophils Absolute 0.1  0.0 - 0.1 K/uL   [place] Transvaginal Non-ob  Result Date: 09/09/2016 CLINICAL DATA:  Severe pain greater in the right lower quad, spotting, unknown cyst.  EXAM: TRANSVAGINAL ULTRASOUND OF PELVIS DOPPLER ULTRASOUND OF OVARIES TECHNIQUE: Transvaginal ultrasound examination of the pelvis was performed including evaluation of the uterus, ovaries, adnexal regions, and pelvic cul-de-sac. Color and duplex Doppler ultrasound was utilized to evaluate blood flow to the ovaries. COMPARISON:  09/06/2016 pelvic ultrasound. FINDINGS: Uterus Measurements: 8.5 x 4.0 x 5.5. No fibroids or other mass visualized. Endometrium IUD in satisfactory position extending to uterine fundus. Right ovary Measurements: 4.6 x 3.8 x 4.4 cm. Right ovarian cyst with reticulation and internal echoes measuring up to 3.1 x 2.4 x 3.6 cm, decreased in size in comparison with prior ultrasound. Left ovary Measurements: 2.9 x 2.5 x 2.2 cm. Normal appearance/no adnexal mass. Pulsed Doppler evaluation demonstrates normal low-resistance arterial and venous waveforms in both ovaries. IMPRESSION: 1. Right ovarian cyst with reticulation and internal echoes, probably a hemorrhagic cyst, decreased in size from prior ultrasound. 6-12 week follow-up is recommended to ensure resolution. This recommendation follows the consensus statement: Management of Asymptomatic Ovarian and Other Adnexal Cysts Imaged at [place]: Society of Radiologists in Ultrasound Consensus Conference Statement. Radiology 2010; [phone]. 2. No pelvic collection or evidence for ovary torsion at this time. 3. IUD in the endometrial cavity. Electronically Signed   By: Mitzi Hansen M.D.   On: 09/09/2016 18:45   Ct Abdomen Pelvis W Contrast  Result Date: 09/09/2016 CLINICAL DATA:  Right lower quadrant pain today.  Vomiting. EXAM: CT ABDOMEN AND PELVIS WITH CONTRAST TECHNIQUE: Multidetector CT imaging of the abdomen and pelvis was performed using the standard protocol following bolus  administration of intravenous contrast. CONTRAST:  100 ml ISOVUE-300 IOPAMIDOL (ISOVUE-300) INJECTION 61% COMPARISON:  CT abdomen and pelvis 06/27/2016. Pelvic ultrasound this same day, 09/06/2016 and 06/29/2016. FINDINGS: Lower chest: Dependent atelectasis is seen in the lung bases. No pleural or pericardial effusion. Hepatobiliary: No focal liver abnormality is seen. No gallstones, gallbladder wall thickening, or biliary dilatation. Pancreas: Unremarkable. No pancreatic ductal dilatation or surrounding inflammatory changes. Spleen: Normal in size without focal abnormality. Adrenals/Urinary Tract: Adrenal glands are unremarkable. Kidneys are normal without renal calculi, focal lesion, or hydronephrosis. Bladder is unremarkable. Stomach/Bowel: Stomach is within normal limits. Appendix appears normal. No evidence of bowel wall thickening, distention, or inflammatory changes. Vascular/Lymphatic: No significant vascular findings are present. No enlarged abdominal or pelvic lymph nodes. Reproductive: Cystic right ovarian lesion is identified as seen on ultrasound this same day. IUD is in place. Nabothian cysts noted. Left ovary appears normal. Other: No abdominal wall hernia or abnormality. No abdominopelvic ascites. Musculoskeletal: Negative. IMPRESSION: Negative for appendicitis.  No acute abnormality abdomen or pelvis. Cystic right ovarian lesion. Please see report of dedicated pelvic ultrasound this same day. Electronically Signed   By: Drusilla Kanner M.D.   On: 09/09/2016 21:46   [place] Art/ven Flow Abd Pelv Doppler  Result Date: 09/09/2016 CLINICAL DATA:  Severe pain greater in the right lower quad, spotting, unknown cyst. EXAM: TRANSVAGINAL ULTRASOUND OF PELVIS DOPPLER ULTRASOUND OF OVARIES TECHNIQUE: Transvaginal ultrasound examination of the pelvis was performed including evaluation of the uterus, ovaries, adnexal regions, and pelvic cul-de-sac. Color and duplex Doppler ultrasound was utilized to evaluate  blood flow to the ovaries. COMPARISON:  09/06/2016 pelvic ultrasound. FINDINGS: Uterus Measurements: 8.5 x 4.0 x 5.5. No fibroids or other mass visualized. Endometrium IUD in satisfactory position extending to uterine fundus. Right ovary Measurements: 4.6 x 3.8 x 4.4 cm. Right ovarian cyst with reticulation and internal echoes measuring up to 3.1 x 2.4 x 3.6 cm, decreased in size in comparison with prior  ultrasound. Left ovary Measurements: 2.9 x 2.5 x 2.2 cm. Normal appearance/no adnexal mass. Pulsed Doppler evaluation demonstrates normal low-resistance arterial and venous waveforms in both ovaries. IMPRESSION: 1. Right ovarian cyst with reticulation and internal echoes, probably a hemorrhagic cyst, decreased in size from prior ultrasound. 6-12 week follow-up is recommended to ensure resolution. This recommendation follows the consensus statement: Management of Asymptomatic Ovarian and Other Adnexal Cysts Imaged at [place]: Society of Radiologists in Ultrasound Consensus Conference Statement. Radiology 2010; [phone]. 2. No pelvic collection or evidence for ovary torsion at this time. 3. IUD in the endometrial cavity. Electronically Signed   By: Mitzi Hansen M.D.   On: 09/09/2016 18:45     MAU Course  Procedures None  MDM UPT - negative UA, CBC and [place] today  IV LR bolus with 1 mg Dilaudid and 4 mg Zofran given Patient reports improvement in nausea. No emesis while in MAU.  Patient continues to rate pain at 8/10 after Dilaudid.  Discussed patient with Dr. Su Hilt. Recommends CT scan and 30 mg Toradol IV. Call Dr. Dion Body with results.  2100 - Patient in CT. Care turned over to [facility], CNM.   Marny Lowenstein, PA-C  09/09/2016, 9:10 PM  2158: D/W Dr. Gwynneth Macleod, will dc home with PO dilaudid and ibuprofen, FU with [facility] as needed  Assessment and Plan   1. Ovarian cyst   2. RLQ abdominal pain    DC home Comfort measures reviewed  RX: dilaudid PRN #10, zofran PRN  Return to MAU as  needed FU with OB as planned  Follow-up Information    [facility] Follow up.   Specialty:  Obstetrics and Gynecology Contact information: [address] [place] [address] [phone]

## 2016-09-09 NOTE — MAU Note (Signed)
Pt was seen in MAU on 12/15 for ovarian cyst, pt states pain is worse & she is spotting.  Feels like her R side is becoming swollen.  Started vomiting today.  Unable to get appointment @ [facility].

## 2016-09-10 MED FILL — ONDANSETRON ODT 8 MG TABLET: 8 | 7 days supply | Qty: 20 | Fill #0

## 2016-09-10 MED FILL — CITALOPRAM HBR 20 MG TABLET: 20 | 30 days supply | Qty: 45 | Fill #0

## 2016-09-10 MED FILL — HYDROmorphone HCL 2 MG TABS: 2 | 3 days supply | Qty: 15 | Fill #0

## 2016-09-11 DIAGNOSIS — F4310 Post-traumatic stress disorder, unspecified: Secondary | ICD-10-CM | POA: Diagnosis not present

## 2016-09-11 DIAGNOSIS — L501 Idiopathic urticaria: Secondary | ICD-10-CM | POA: Diagnosis not present

## 2016-09-13 DIAGNOSIS — R102 Pelvic and perineal pain: Secondary | ICD-10-CM | POA: Diagnosis not present

## 2016-09-13 DIAGNOSIS — K219 Gastro-esophageal reflux disease without esophagitis: Secondary | ICD-10-CM | POA: Diagnosis not present

## 2016-09-13 DIAGNOSIS — N83209 Unspecified ovarian cyst, unspecified side: Secondary | ICD-10-CM | POA: Diagnosis not present

## 2016-09-13 MED FILL — HYDROmorphone HCL 2 MG TABS: 2 | 2 days supply | Qty: 16 | Fill #0

## 2016-09-14 ENCOUNTER — Inpatient Hospital Stay (HOSPITAL_COMMUNITY)
Admission: AD | Admit: 2016-09-14 | Discharge: 2016-09-15 | Disposition: A | Discharge status: Home / Self Care | Payer: 59 | Source: Ambulatory Visit | Attending: Obstetrics and Gynecology | Admitting: Obstetrics and Gynecology

## 2016-09-14 ENCOUNTER — Encounter (HOSPITAL_COMMUNITY): Payer: Self-pay

## 2016-09-14 DIAGNOSIS — G8929 Other chronic pain: Secondary | ICD-10-CM | POA: Diagnosis not present

## 2016-09-14 DIAGNOSIS — R109 Unspecified abdominal pain: Secondary | ICD-10-CM | POA: Diagnosis not present

## 2016-09-14 DIAGNOSIS — N83201 Unspecified ovarian cyst, right side: Secondary | ICD-10-CM | POA: Diagnosis not present

## 2016-09-14 LAB — CBC WITH DIFFERENTIAL/PLATELET
BASOS PCT: 1 %
Basophils Absolute: 0.1 10*3/uL (ref 0.0–0.1)
Eosinophils Absolute: 0.3 10*3/uL (ref 0.0–0.7)
Eosinophils Relative: 2 %
HEMATOCRIT: 37.9 % (ref 36.0–46.0)
Hemoglobin: 13.1 g/dL (ref 12.0–15.0)
LYMPHS ABS: 3.2 10*3/uL (ref 0.7–4.0)
LYMPHS PCT: 26 %
MCH: 30.8 pg (ref 26.0–34.0)
MCHC: 34.6 g/dL (ref 30.0–36.0)
MCV: 89 fL (ref 78.0–100.0)
MONO ABS: 0.5 10*3/uL (ref 0.1–1.0)
MONOS PCT: 4 %
NEUTROS ABS: 8.4 10*3/uL — AB (ref 1.7–7.7)
NEUTROS PCT: 67 %
Platelets: 235 10*3/uL (ref 150–400)
RBC: 4.26 MIL/uL (ref 3.87–5.11)
RDW: 13.6 % (ref 11.5–15.5)
WBC: 12.4 10*3/uL — ABNORMAL HIGH (ref 4.0–10.5)

## 2016-09-14 LAB — URINALYSIS, ROUTINE W REFLEX MICROSCOPIC
Bacteria, UA: NONE SEEN
Bilirubin Urine: NEGATIVE
GLUCOSE, UA: NEGATIVE mg/dL
Ketones, ur: NEGATIVE mg/dL
NITRITE: NEGATIVE
PH: 6 (ref 5.0–8.0)
PROTEIN: NEGATIVE mg/dL
SPECIFIC GRAVITY, URINE: 1.015 (ref 1.005–1.030)

## 2016-09-14 LAB — POCT PREGNANCY, URINE: Preg Test, Ur: NEGATIVE

## 2016-09-14 NOTE — MAU Note (Signed)
Had Endo on 12/14 and that night started having pain in lower abd. Seen here Monday and had CT and u/s. Showed R ovarian cyst. Given pain meds but pain getting worse and now in mid abdomen. Period started 12/19 so period is coming to end.

## 2016-09-15 DIAGNOSIS — G8929 Other chronic pain: Secondary | ICD-10-CM | POA: Diagnosis not present

## 2016-09-15 DIAGNOSIS — R109 Unspecified abdominal pain: Secondary | ICD-10-CM | POA: Diagnosis not present

## 2016-09-15 DIAGNOSIS — N83201 Unspecified ovarian cyst, right side: Secondary | ICD-10-CM

## 2016-09-15 LAB — WET PREP, GENITAL
Clue Cells Wet Prep HPF POC: NONE SEEN
Sperm: NONE SEEN
TRICH WET PREP: NONE SEEN
YEAST WET PREP: NONE SEEN

## 2016-09-15 MED ORDER — KETOROLAC TROMETHAMINE 60 MG/2ML IM SOLN
60.0000 mg | Freq: Once | INTRAMUSCULAR | Status: AC
  Administered 2016-09-15: 60 mg via INTRAMUSCULAR
  Filled 2016-09-15: qty 2

## 2016-09-15 MED ORDER — DOXYCYCLINE HYCLATE 100 MG PO TABS
100.0000 mg | ORAL_TABLET | Freq: Once | ORAL | Status: AC
  Administered 2016-09-15: 100 mg via ORAL
  Filled 2016-09-15: qty 1

## 2016-09-15 MED ORDER — ONDANSETRON 8 MG PO TBDP
8.0000 mg | ORAL_TABLET | Freq: Once | ORAL | Status: AC
  Administered 2016-09-15: 8 mg via ORAL
  Filled 2016-09-15: qty 1

## 2016-09-15 MED ORDER — CEFTRIAXONE SODIUM 250 MG IJ SOLR
250.0000 mg | Freq: Once | INTRAMUSCULAR | Status: AC
  Administered 2016-09-15: 250 mg via INTRAMUSCULAR
  Filled 2016-09-15: qty 250

## 2016-09-15 MED ORDER — METRONIDAZOLE 500 MG PO TABS: 500.0000 mg | ORAL_TABLET | Freq: Two times a day (BID) | ORAL | 0 refills | Status: DC

## 2016-09-15 MED ORDER — METRONIDAZOLE 500 MG PO TABS
500.0000 mg | ORAL_TABLET | Freq: Once | ORAL | Status: AC
  Administered 2016-09-15: 500 mg via ORAL
  Filled 2016-09-15: qty 1

## 2016-09-15 MED ORDER — DOXYCYCLINE HYCLATE 100 MG PO CAPS: 100.0000 mg | ORAL_CAPSULE | Freq: Two times a day (BID) | ORAL | 0 refills | Status: DC

## 2016-09-15 NOTE — Discharge Instructions (Signed)

## 2016-09-17 ENCOUNTER — Telehealth: Payer: Self-pay | Admitting: Internal Medicine

## 2016-09-17 LAB — GC/CHLAMYDIA PROBE AMP (~~LOC~~) NOT AT ARMC
CHLAMYDIA, DNA PROBE: NEGATIVE
NEISSERIA GONORRHEA: NEGATIVE

## 2016-09-17 MED ORDER — PANTOPRAZOLE SODIUM 40 MG PO TBEC: 40.0000 mg | DELAYED_RELEASE_TABLET | Freq: Every day | ORAL | 0 refills | Status: DC

## 2016-09-17 MED ORDER — DICYCLOMINE HCL 20 MG PO TABS: 20.0000 mg | ORAL_TABLET | Freq: Four times a day (QID) | ORAL | 0 refills | Status: DC

## 2016-09-17 NOTE — Telephone Encounter (Signed)
Let her know that the duodenal biopsies suggest inflammation from acid  1) Start pantoprazole 40 mg qac breakfast # 90 no refill 2) dicyclomine 20 mg q 6 hrs prn pain also - # 90 1 RF 3) REV me 2 mos 4) If not getting benefit after 2 weeks call back 5) Labs and imaging and EGD are all very reassuring at this point I.e. Nothing bad going on so would not do any other testing at this time but will reconsider if treatments do not help

## 2016-09-17 NOTE — Telephone Encounter (Signed)
Patient reports that she is having abdominal pain "entire abdomen, especially around the belly button" She describes the pain as stabbing and burning.  She has had several ED visits since the procedure.  Her GYN said that the pain should not be related to her ovarian cyst and instructed her to call GI.  Dr. Leone PayorGessner please advise.  NO APP appts until 09/26/16

## 2016-09-17 NOTE — Telephone Encounter (Signed)
Patient notified of the recommendations rx sent Follow up scheduled for 11/20/16

## 2016-09-17 NOTE — Progress Notes (Signed)
See phone note for info - dx peptic duodenitis Will add PPI No letter/no recall

## 2016-09-19 ENCOUNTER — Telehealth: Payer: Self-pay | Admitting: Internal Medicine

## 2016-09-19 NOTE — Telephone Encounter (Signed)
?   If metronidazole is making her sick Agree w/ advice May need to ask prescriber for an alternative - is on for bacterial vaginosis

## 2016-09-19 NOTE — Telephone Encounter (Signed)
Patient notified

## 2016-09-19 NOTE — Telephone Encounter (Signed)
Patient reports that she had vomiting that started about 2 hours ago.  She tried zofran , but had vomiting shortly after.  I advised her to try the ODT zofran and try only ice chips for now, once nausea and vomiting has resolved she can slowly advance to clear liquids.  She is advised that if the vomiting is not controlled with zofran and persists or develops new symptoms she should proceed to the ED

## 2016-09-19 NOTE — Telephone Encounter (Signed)
Patient calling back stating that now she can longer hold down water and would like to know what to do

## 2016-09-20 MED FILL — clonazePAM 0.5 MG TABS: 0.5 | 30 days supply | Qty: 60 | Fill #1

## 2016-10-01 DIAGNOSIS — E282 Polycystic ovarian syndrome: Secondary | ICD-10-CM | POA: Diagnosis not present

## 2016-10-01 DIAGNOSIS — N83209 Unspecified ovarian cyst, unspecified side: Secondary | ICD-10-CM | POA: Insufficient documentation

## 2016-10-01 DIAGNOSIS — R102 Pelvic and perineal pain: Secondary | ICD-10-CM | POA: Diagnosis not present

## 2016-10-15 DIAGNOSIS — F4310 Post-traumatic stress disorder, unspecified: Secondary | ICD-10-CM | POA: Diagnosis not present

## 2016-10-15 DIAGNOSIS — H578 Other specified disorders of eye and adnexa: Secondary | ICD-10-CM | POA: Diagnosis not present

## 2016-10-15 MED FILL — CITALOPRAM HBR 20 MG TABLET: 20 | 30 days supply | Qty: 45 | Fill #0

## 2016-10-15 MED FILL — PRAZOSIN 1 MG CAPSULE: 1 | 30 days supply | Qty: 90 | Fill #0

## 2016-10-15 MED FILL — QUETIAPINE FUMARATE 50 MG T: 50 | 30 days supply | Qty: 60 | Fill #0

## 2016-10-15 MED FILL — clonazePAM 0.5 MG TABS: 0.5 | 30 days supply | Qty: 75 | Fill #0

## 2016-10-17 DIAGNOSIS — L2389 Allergic contact dermatitis due to other agents: Secondary | ICD-10-CM | POA: Diagnosis not present

## 2016-10-22 DIAGNOSIS — R102 Pelvic and perineal pain: Secondary | ICD-10-CM | POA: Diagnosis not present

## 2016-10-22 DIAGNOSIS — N760 Acute vaginitis: Secondary | ICD-10-CM | POA: Diagnosis not present

## 2016-10-24 ENCOUNTER — Other Ambulatory Visit: Payer: Self-pay | Admitting: Obstetrics & Gynecology

## 2016-10-24 DIAGNOSIS — L501 Idiopathic urticaria: Secondary | ICD-10-CM | POA: Diagnosis not present

## 2016-10-28 NOTE — Patient Instructions (Addendum)
Your procedure is scheduled on:  Thursday, Feb. 8, 2018  [facility] at:  11:45 AM  Pick up the phone at the desk and dial [phone].  Call this number if you have problems the morning of surgery: [phone].  Remember: Do NOT eat food:  5:00 AM day of surgery  Do NOT drink clear liquids after:  9:00 AM day of surgery  Take these medicines the morning of surgery with a SIP OF WATER:  Pantoprazole  Stop ALL herbal medications at this time  Do NOT smoke the day of surgery.  Do NOT wear jewelry (body piercing), metal hair clips/bobby pins, make-up, or nail polish. Do NOT wear lotions, powders, or perfumes.  You may wear deodorant. Do NOT shave for 48 hours prior to surgery. Do NOT bring valuables to the hospital. Contacts, dentures, or bridgework may not be worn into surgery.  Have a responsible adult drive you home and stay with you for 24 hours after your procedure  Bring a copy of your healthcare power of attorney and living will documents.  **Effective Friday, Jan. 12, 2018, [facility] will implement no hospital visitations from children age 29 and younger due to a steady increase in flu activity in our community and hospitals. **

## 2016-10-29 ENCOUNTER — Encounter (HOSPITAL_COMMUNITY)
Admission: RE | Admit: 2016-10-29 | Discharge: 2016-10-29 | Disposition: A | Discharge status: Home / Self Care | Payer: 59 | Source: Ambulatory Visit | Attending: Obstetrics & Gynecology | Admitting: Obstetrics & Gynecology

## 2016-10-29 ENCOUNTER — Encounter (HOSPITAL_COMMUNITY): Payer: Self-pay

## 2016-10-29 DIAGNOSIS — F419 Anxiety disorder, unspecified: Secondary | ICD-10-CM | POA: Diagnosis not present

## 2016-10-29 DIAGNOSIS — N736 Female pelvic peritoneal adhesions (postinfective): Secondary | ICD-10-CM | POA: Diagnosis not present

## 2016-10-29 DIAGNOSIS — Z79899 Other long term (current) drug therapy: Secondary | ICD-10-CM | POA: Diagnosis not present

## 2016-10-29 DIAGNOSIS — Z825 Family history of asthma and other chronic lower respiratory diseases: Secondary | ICD-10-CM | POA: Diagnosis not present

## 2016-10-29 DIAGNOSIS — Z91018 Allergy to other foods: Secondary | ICD-10-CM | POA: Diagnosis not present

## 2016-10-29 DIAGNOSIS — F4310 Post-traumatic stress disorder, unspecified: Secondary | ICD-10-CM | POA: Diagnosis not present

## 2016-10-29 DIAGNOSIS — Z91048 Other nonmedicinal substance allergy status: Secondary | ICD-10-CM | POA: Diagnosis not present

## 2016-10-29 DIAGNOSIS — Z9889 Other specified postprocedural states: Secondary | ICD-10-CM | POA: Diagnosis not present

## 2016-10-29 DIAGNOSIS — Z79891 Long term (current) use of opiate analgesic: Secondary | ICD-10-CM | POA: Diagnosis not present

## 2016-10-29 DIAGNOSIS — E282 Polycystic ovarian syndrome: Secondary | ICD-10-CM | POA: Diagnosis not present

## 2016-10-29 DIAGNOSIS — L503 Dermatographic urticaria: Secondary | ICD-10-CM | POA: Diagnosis not present

## 2016-10-29 DIAGNOSIS — R102 Pelvic and perineal pain: Secondary | ICD-10-CM | POA: Diagnosis present

## 2016-10-29 DIAGNOSIS — Z8489 Family history of other specified conditions: Secondary | ICD-10-CM | POA: Diagnosis not present

## 2016-10-29 DIAGNOSIS — Z91013 Allergy to seafood: Secondary | ICD-10-CM | POA: Diagnosis not present

## 2016-10-29 DIAGNOSIS — Z880 Allergy status to penicillin: Secondary | ICD-10-CM | POA: Diagnosis not present

## 2016-10-29 DIAGNOSIS — Z91012 Allergy to eggs: Secondary | ICD-10-CM | POA: Diagnosis not present

## 2016-10-29 DIAGNOSIS — K219 Gastro-esophageal reflux disease without esophagitis: Secondary | ICD-10-CM | POA: Diagnosis not present

## 2016-10-29 DIAGNOSIS — Z87891 Personal history of nicotine dependence: Secondary | ICD-10-CM | POA: Diagnosis not present

## 2016-10-29 DIAGNOSIS — Z793 Long term (current) use of hormonal contraceptives: Secondary | ICD-10-CM | POA: Diagnosis not present

## 2016-10-29 HISTORY — DX: Adverse effect of unspecified anesthetic, initial encounter: T41.45XA

## 2016-10-29 HISTORY — DX: Gastro-esophageal reflux disease without esophagitis: K21.9

## 2016-10-29 HISTORY — DX: Other complications of anesthesia, initial encounter: T88.59XA

## 2016-10-29 LAB — CBC
HEMATOCRIT: 42.1 % (ref 36.0–46.0)
Hemoglobin: 14.1 g/dL (ref 12.0–15.0)
MCH: 30.4 pg (ref 26.0–34.0)
MCHC: 33.5 g/dL (ref 30.0–36.0)
MCV: 90.7 fL (ref 78.0–100.0)
PLATELETS: 217 10*3/uL (ref 150–400)
RBC: 4.64 MIL/uL (ref 3.87–5.11)
RDW: 13.4 % (ref 11.5–15.5)
WBC: 11.6 10*3/uL — AB (ref 4.0–10.5)

## 2016-10-31 ENCOUNTER — Ambulatory Visit (HOSPITAL_COMMUNITY)
Admission: RE | Admit: 2016-10-31 | Discharge: 2016-10-31 | Disposition: A | Discharge status: Home / Self Care | Payer: 59 | Source: Ambulatory Visit | Attending: Obstetrics & Gynecology | Admitting: Obstetrics & Gynecology

## 2016-10-31 ENCOUNTER — Ambulatory Visit (HOSPITAL_COMMUNITY): Payer: 59 | Admitting: Anesthesiology

## 2016-10-31 ENCOUNTER — Encounter (HOSPITAL_COMMUNITY): Payer: Self-pay | Admitting: Emergency Medicine

## 2016-10-31 ENCOUNTER — Ambulatory Visit (HOSPITAL_COMMUNITY): Payer: 59

## 2016-10-31 ENCOUNTER — Encounter (HOSPITAL_COMMUNITY)
Admission: RE | Disposition: A | Discharge status: Home / Self Care | Payer: Self-pay | Source: Ambulatory Visit | Attending: Obstetrics & Gynecology

## 2016-10-31 DIAGNOSIS — Z79891 Long term (current) use of opiate analgesic: Secondary | ICD-10-CM | POA: Diagnosis not present

## 2016-10-31 DIAGNOSIS — Z91012 Allergy to eggs: Secondary | ICD-10-CM | POA: Insufficient documentation

## 2016-10-31 DIAGNOSIS — Z79899 Other long term (current) drug therapy: Secondary | ICD-10-CM | POA: Diagnosis not present

## 2016-10-31 DIAGNOSIS — Z91013 Allergy to seafood: Secondary | ICD-10-CM | POA: Insufficient documentation

## 2016-10-31 DIAGNOSIS — Z880 Allergy status to penicillin: Secondary | ICD-10-CM | POA: Insufficient documentation

## 2016-10-31 DIAGNOSIS — K219 Gastro-esophageal reflux disease without esophagitis: Secondary | ICD-10-CM | POA: Insufficient documentation

## 2016-10-31 DIAGNOSIS — L503 Dermatographic urticaria: Secondary | ICD-10-CM | POA: Insufficient documentation

## 2016-10-31 DIAGNOSIS — R079 Chest pain, unspecified: Secondary | ICD-10-CM | POA: Diagnosis not present

## 2016-10-31 DIAGNOSIS — R0789 Other chest pain: Secondary | ICD-10-CM

## 2016-10-31 DIAGNOSIS — R102 Pelvic and perineal pain: Secondary | ICD-10-CM | POA: Diagnosis not present

## 2016-10-31 DIAGNOSIS — F419 Anxiety disorder, unspecified: Secondary | ICD-10-CM | POA: Insufficient documentation

## 2016-10-31 DIAGNOSIS — Z825 Family history of asthma and other chronic lower respiratory diseases: Secondary | ICD-10-CM | POA: Insufficient documentation

## 2016-10-31 DIAGNOSIS — N738 Other specified female pelvic inflammatory diseases: Secondary | ICD-10-CM | POA: Diagnosis not present

## 2016-10-31 DIAGNOSIS — Z9889 Other specified postprocedural states: Secondary | ICD-10-CM | POA: Insufficient documentation

## 2016-10-31 DIAGNOSIS — Z87891 Personal history of nicotine dependence: Secondary | ICD-10-CM | POA: Insufficient documentation

## 2016-10-31 DIAGNOSIS — Z793 Long term (current) use of hormonal contraceptives: Secondary | ICD-10-CM | POA: Diagnosis not present

## 2016-10-31 DIAGNOSIS — Z91018 Allergy to other foods: Secondary | ICD-10-CM | POA: Insufficient documentation

## 2016-10-31 DIAGNOSIS — N736 Female pelvic peritoneal adhesions (postinfective): Secondary | ICD-10-CM | POA: Insufficient documentation

## 2016-10-31 DIAGNOSIS — Z91048 Other nonmedicinal substance allergy status: Secondary | ICD-10-CM | POA: Insufficient documentation

## 2016-10-31 DIAGNOSIS — E282 Polycystic ovarian syndrome: Secondary | ICD-10-CM | POA: Insufficient documentation

## 2016-10-31 DIAGNOSIS — Z8489 Family history of other specified conditions: Secondary | ICD-10-CM | POA: Insufficient documentation

## 2016-10-31 HISTORY — DX: Other specified postprocedural states: Z98.890

## 2016-10-31 HISTORY — DX: Other specified postprocedural states: R11.2

## 2016-10-31 HISTORY — PX: LAPAROSCOPIC LYSIS OF ADHESIONS: SHX5905

## 2016-10-31 HISTORY — PX: LAPAROSCOPY: SHX197

## 2016-10-31 LAB — PREGNANCY, URINE: PREG TEST UR: NEGATIVE

## 2016-10-31 SURGERY — LYSIS, ADHESIONS, LAPAROSCOPIC
Anesthesia: General | Site: Abdomen

## 2016-10-31 MED ORDER — PROPOFOL 10 MG/ML IV BOLUS
INTRAVENOUS | Status: DC | PRN
  Administered 2016-10-31: 180 mg via INTRAVENOUS

## 2016-10-31 MED ORDER — SCOPOLAMINE 1 MG/3DAYS TD PT72
MEDICATED_PATCH | TRANSDERMAL | Status: AC
  Administered 2016-10-31: 1.5 mg via TRANSDERMAL
  Filled 2016-10-31: qty 1

## 2016-10-31 MED ORDER — SUGAMMADEX SODIUM 200 MG/2ML IV SOLN
INTRAVENOUS | Status: DC | PRN
  Administered 2016-10-31: 200 mg via INTRAVENOUS

## 2016-10-31 MED ORDER — HYDROMORPHONE HCL 2 MG PO TABS
2.0000 mg | ORAL_TABLET | Freq: Once | ORAL | Status: AC
  Administered 2016-10-31: 2 mg via ORAL

## 2016-10-31 MED ORDER — FENTANYL CITRATE (PF) 100 MCG/2ML IJ SOLN
INTRAMUSCULAR | Status: AC
  Filled 2016-10-31: qty 2

## 2016-10-31 MED ORDER — LIDOCAINE HCL (CARDIAC) 20 MG/ML IV SOLN
INTRAVENOUS | Status: AC
Start: 2016-10-31 — End: ?
  Filled 2016-10-31: qty 5

## 2016-10-31 MED ORDER — ALBUTEROL SULFATE (2.5 MG/3ML) 0.083% IN NEBU
3.0000 mL | INHALATION_SOLUTION | RESPIRATORY_TRACT | Status: AC
  Administered 2016-10-31: 3 mL via RESPIRATORY_TRACT

## 2016-10-31 MED ORDER — ONDANSETRON HCL 4 MG/2ML IJ SOLN: 4.0000 mg | Freq: Once | INTRAMUSCULAR | Status: DC | PRN

## 2016-10-31 MED ORDER — MIDAZOLAM HCL 2 MG/2ML IJ SOLN
INTRAMUSCULAR | Status: DC | PRN
  Administered 2016-10-31 (×2): 1 mg via INTRAVENOUS

## 2016-10-31 MED ORDER — HYDROMORPHONE HCL 1 MG/ML IJ SOLN
INTRAMUSCULAR | Status: AC
  Filled 2016-10-31: qty 1

## 2016-10-31 MED ORDER — LACTATED RINGERS IV SOLN
INTRAVENOUS | Status: DC
  Administered 2016-10-31 (×2): via INTRAVENOUS
  Administered 2016-10-31: 125 mL/h via INTRAVENOUS

## 2016-10-31 MED ORDER — MIDAZOLAM HCL 2 MG/2ML IJ SOLN
INTRAMUSCULAR | Status: AC
  Administered 2016-10-31: 1 mg via INTRAVENOUS
  Filled 2016-10-31: qty 2

## 2016-10-31 MED ORDER — RACEPINEPHRINE HCL 2.25 % IN NEBU
0.5000 mL | INHALATION_SOLUTION | Freq: Once | RESPIRATORY_TRACT | Status: AC
  Administered 2016-10-31: 0.5 mL via RESPIRATORY_TRACT

## 2016-10-31 MED ORDER — FENTANYL CITRATE (PF) 100 MCG/2ML IJ SOLN
INTRAMUSCULAR | Status: AC
  Administered 2016-10-31: 50 ug via INTRAVENOUS
  Filled 2016-10-31: qty 2

## 2016-10-31 MED ORDER — SCOPOLAMINE 1 MG/3DAYS TD PT72
1.0000 | MEDICATED_PATCH | Freq: Once | TRANSDERMAL | Status: DC
  Administered 2016-10-31: 1.5 mg via TRANSDERMAL

## 2016-10-31 MED ORDER — FENTANYL CITRATE (PF) 100 MCG/2ML IJ SOLN
25.0000 ug | INTRAMUSCULAR | Status: DC | PRN
  Administered 2016-10-31 (×3): 50 ug via INTRAVENOUS

## 2016-10-31 MED ORDER — IBUPROFEN 800 MG PO TABS: 800.0000 mg | ORAL_TABLET | Freq: Three times a day (TID) | ORAL | 0 refills | Status: DC | PRN

## 2016-10-31 MED ORDER — ONDANSETRON HCL 4 MG/2ML IJ SOLN
INTRAMUSCULAR | Status: AC
  Filled 2016-10-31: qty 2

## 2016-10-31 MED ORDER — HYDROMORPHONE HCL 2 MG PO TABS: 2.0000 mg | ORAL_TABLET | Freq: Four times a day (QID) | ORAL | 0 refills | Status: DC | PRN

## 2016-10-31 MED ORDER — HYDROMORPHONE HCL 2 MG PO TABS
ORAL_TABLET | ORAL | Status: AC
  Administered 2016-10-31: 2 mg via ORAL
  Filled 2016-10-31: qty 1

## 2016-10-31 MED ORDER — KETOROLAC TROMETHAMINE 30 MG/ML IJ SOLN
INTRAMUSCULAR | Status: DC | PRN
  Administered 2016-10-31: 30 mg via INTRAVENOUS

## 2016-10-31 MED ORDER — OXYCODONE HCL 5 MG/5ML PO SOLN: 5.0000 mg | Freq: Once | ORAL | Status: DC | PRN

## 2016-10-31 MED ORDER — DEXAMETHASONE SODIUM PHOSPHATE 4 MG/ML IJ SOLN
INTRAMUSCULAR | Status: AC
  Filled 2016-10-31: qty 1

## 2016-10-31 MED ORDER — SUGAMMADEX SODIUM 200 MG/2ML IV SOLN
INTRAVENOUS | Status: AC
  Filled 2016-10-31: qty 2

## 2016-10-31 MED ORDER — PHENYLEPHRINE HCL 10 MG/ML IJ SOLN
INTRAMUSCULAR | Status: DC | PRN
  Administered 2016-10-31: 20 ug via INTRAVENOUS
  Administered 2016-10-31: 120 ug via INTRAVENOUS

## 2016-10-31 MED ORDER — PROPOFOL 10 MG/ML IV BOLUS
INTRAVENOUS | Status: AC
  Filled 2016-10-31: qty 20

## 2016-10-31 MED ORDER — KETOROLAC TROMETHAMINE 30 MG/ML IJ SOLN: 30.0000 mg | Freq: Once | INTRAMUSCULAR | Status: DC

## 2016-10-31 MED ORDER — HYDROMORPHONE HCL 1 MG/ML IJ SOLN
1.0000 mg | Freq: Once | INTRAMUSCULAR | Status: AC
  Administered 2016-10-31: 1 mg via INTRAVENOUS

## 2016-10-31 MED ORDER — MIDAZOLAM HCL 2 MG/2ML IJ SOLN
INTRAMUSCULAR | Status: AC
  Filled 2016-10-31: qty 2

## 2016-10-31 MED ORDER — DEXAMETHASONE SODIUM PHOSPHATE 10 MG/ML IJ SOLN
INTRAMUSCULAR | Status: DC | PRN
  Administered 2016-10-31: 4 mg via INTRAVENOUS

## 2016-10-31 MED ORDER — MEPERIDINE HCL 25 MG/ML IJ SOLN: 6.2500 mg | INTRAMUSCULAR | Status: DC | PRN

## 2016-10-31 MED ORDER — ROCURONIUM BROMIDE 100 MG/10ML IV SOLN
INTRAVENOUS | Status: AC
  Filled 2016-10-31: qty 1

## 2016-10-31 MED ORDER — RACEPINEPHRINE HCL 2.25 % IN NEBU
INHALATION_SOLUTION | RESPIRATORY_TRACT | Status: AC
  Administered 2016-10-31: 0.5 mL via RESPIRATORY_TRACT
  Filled 2016-10-31: qty 0.5

## 2016-10-31 MED ORDER — ACETAMINOPHEN 325 MG PO TABS: 325.0000 mg | ORAL_TABLET | ORAL | Status: DC | PRN

## 2016-10-31 MED ORDER — ROCURONIUM BROMIDE 100 MG/10ML IV SOLN
INTRAVENOUS | Status: DC | PRN
  Administered 2016-10-31: 40 mg via INTRAVENOUS
  Administered 2016-10-31: 20 mg via INTRAVENOUS
  Administered 2016-10-31: 10 mg via INTRAVENOUS

## 2016-10-31 MED ORDER — ACETAMINOPHEN 160 MG/5ML PO SOLN: 325.0000 mg | ORAL | Status: DC | PRN

## 2016-10-31 MED ORDER — LIDOCAINE-EPINEPHRINE 1 %-1:100000 IJ SOLN
INTRAMUSCULAR | Status: AC
  Filled 2016-10-31: qty 1

## 2016-10-31 MED ORDER — ONDANSETRON HCL 4 MG/2ML IJ SOLN
INTRAMUSCULAR | Status: DC | PRN
  Administered 2016-10-31: 4 mg via INTRAVENOUS

## 2016-10-31 MED ORDER — PHENYLEPHRINE 40 MCG/ML (10ML) SYRINGE FOR IV PUSH (FOR BLOOD PRESSURE SUPPORT)
PREFILLED_SYRINGE | INTRAVENOUS | Status: AC
  Filled 2016-10-31: qty 10

## 2016-10-31 MED ORDER — MIDAZOLAM HCL 2 MG/2ML IJ SOLN
1.0000 mg | Freq: Once | INTRAMUSCULAR | Status: AC
  Administered 2016-10-31: 1 mg via INTRAVENOUS

## 2016-10-31 MED ORDER — FENTANYL CITRATE (PF) 100 MCG/2ML IJ SOLN
INTRAMUSCULAR | Status: DC | PRN
  Administered 2016-10-31 (×5): 50 ug via INTRAVENOUS

## 2016-10-31 MED ORDER — OXYCODONE HCL 5 MG PO TABS: 5.0000 mg | ORAL_TABLET | Freq: Once | ORAL | Status: DC | PRN

## 2016-10-31 MED ORDER — ALBUTEROL SULFATE (2.5 MG/3ML) 0.083% IN NEBU
INHALATION_SOLUTION | RESPIRATORY_TRACT | Status: AC
  Administered 2016-10-31: 3 mL via RESPIRATORY_TRACT
  Filled 2016-10-31: qty 3

## 2016-10-31 MED ORDER — KETOROLAC TROMETHAMINE 30 MG/ML IJ SOLN
INTRAMUSCULAR | Status: AC
  Filled 2016-10-31: qty 1

## 2016-10-31 MED ORDER — LIDOCAINE-EPINEPHRINE 1 %-1:100000 IJ SOLN
INTRAMUSCULAR | Status: DC | PRN
  Administered 2016-10-31: 5 mL
  Administered 2016-10-31: 4 mL

## 2016-10-31 MED ORDER — LIDOCAINE HCL (CARDIAC) 20 MG/ML IV SOLN
INTRAVENOUS | Status: DC | PRN
  Administered 2016-10-31: 100 mg via INTRAVENOUS
  Administered 2016-10-31: 70 mg via INTRAVENOUS

## 2016-10-31 SURGICAL SUPPLY — 34 items
CABLE HIGH FREQUENCY MONO STRZ (ELECTRODE) ×4 IMPLANT
CANISTER SUCTION 2500CC (MISCELLANEOUS) ×4 IMPLANT
CLOTH BEACON ORANGE TIMEOUT ST (SAFETY) ×4 IMPLANT
DECANTER SPIKE VIAL GLASS SM (MISCELLANEOUS) ×4 IMPLANT
DERMABOND ADVANCED (GAUZE/BANDAGES/DRESSINGS) ×2
DERMABOND ADVANCED .7 DNX12 (GAUZE/BANDAGES/DRESSINGS) ×2 IMPLANT
DRSG OPSITE POSTOP 3X4 (GAUZE/BANDAGES/DRESSINGS) ×4 IMPLANT
DURAPREP 26ML APPLICATOR (WOUND CARE) ×4 IMPLANT
GLOVE BIOGEL PI IND STRL 7.0 (GLOVE) ×4 IMPLANT
GLOVE BIOGEL PI INDICATOR 7.0 (GLOVE) ×4
GLOVE ECLIPSE 6.5 STRL STRAW (GLOVE) ×4 IMPLANT
GOWN STRL REUS W/TWL LRG LVL3 (GOWN DISPOSABLE) ×12 IMPLANT
LIGASURE VESSEL 5MM BLUNT TIP (ELECTROSURGICAL) IMPLANT
NEEDLE INSUFFLATION 120MM (ENDOMECHANICALS) IMPLANT
NS IRRIG 1000ML POUR BTL (IV SOLUTION) ×4 IMPLANT
PACK LAPAROSCOPY BASIN (CUSTOM PROCEDURE TRAY) ×4 IMPLANT
PACK TRENDGUARD 450 HYBRID PRO (MISCELLANEOUS) ×2 IMPLANT
PACK TRENDGUARD 600 HYBRD PROC (MISCELLANEOUS) IMPLANT
POUCH SPECIMEN RETRIEVAL 10MM (ENDOMECHANICALS) IMPLANT
PROTECTOR NERVE ULNAR (MISCELLANEOUS) ×8 IMPLANT
SET IRRIG TUBING LAPAROSCOPIC (IRRIGATION / IRRIGATOR) IMPLANT
SLEEVE XCEL OPT CAN 5 100 (ENDOMECHANICALS) ×4 IMPLANT
SOLUTION ELECTROLUBE (MISCELLANEOUS) IMPLANT
SUT MNCRL AB 4-0 PS2 18 (SUTURE) ×4 IMPLANT
SUT VICRYL 0 UR6 27IN ABS (SUTURE) ×4 IMPLANT
SUT VICRYL 4-0 PS2 18IN ABS (SUTURE) IMPLANT
TOWEL OR 17X24 6PK STRL BLUE (TOWEL DISPOSABLE) ×8 IMPLANT
TRAY FOLEY CATH SILVER 14FR (SET/KITS/TRAYS/PACK) ×4 IMPLANT
TRENDGUARD 450 HYBRID PRO PACK (MISCELLANEOUS) ×4
TRENDGUARD 600 HYBRID PROC PK (MISCELLANEOUS)
TROCAR BALLN 12MMX100 BLUNT (TROCAR) ×4 IMPLANT
TROCAR XCEL NON-BLD 11X100MML (ENDOMECHANICALS) IMPLANT
TROCAR XCEL NON-BLD 5MMX100MML (ENDOMECHANICALS) ×4 IMPLANT
WARMER LAPAROSCOPE (MISCELLANEOUS) ×4 IMPLANT

## 2016-10-31 NOTE — Transfer of Care (Signed)
Immediate Anesthesia Transfer of Care Note  Patient: [name]  Procedure(s) Performed: Procedure(s): LAPAROSCOPIC LYSIS OF ADHESIONS (N/A) LAPAROSCOPY DIAGNOSTIC (N/A)  Patient Location: PACU  Anesthesia Type:General  Level of Consciousness: awake, alert  and oriented  Airway & Oxygen Therapy: Patient Spontanous Breathing and Patient connected to nasal cannula oxygen  Post-op Assessment: Report given to RN and Post -op Vital signs reviewed and stable  Post vital signs: Reviewed and stable  Last Vitals:  Vitals:   10/31/16 1145  BP: 109/74  Pulse: 67  Resp: 16  Temp: 36.7 C    Last Pain:  Vitals:   10/31/16 1145  TempSrc: Oral  PainSc: 4       Patients Stated Pain Goal: 4 (10/31/16 1145)  Complications: No apparent anesthesia complications

## 2016-10-31 NOTE — Brief Op Note (Signed)
10/31/2016  4:11 PM  PATIENT:  Lenox PondsHeather M Elgin  31 y.o. female  PRE-OPERATIVE DIAGNOSIS:  Pelvic Pain  POST-OPERATIVE DIAGNOSIS:  Pelvic adhesions  PROCEDURE:  Procedure(s): LAPAROSCOPIC LYSIS OF ADHESIONS (N/A) LAPAROSCOPY DIAGNOSTIC (N/A)  SURGEON:  Surgeon(s) and Role:    * Hoover BrownsEma Nyomi Howser, MD - Primary  ASSISTANTS: Scrub technicians- [place]  ANESTHESIA:   general  EBL:  Total I/O In: 1400 [I.V.:1400] Out: 205 [Urine:200; Blood:5]  BLOOD ADMINISTERED:none  DRAINS: none   LOCAL MEDICATIONS USED:  XYLOCAINE with EPINEPHRINE  SPECIMEN:  No Specimen  DISPOSITION OF SPECIMEN:  N/A  COUNTS:  YES  TOURNIQUET:  * No tourniquets in log *  DICTATION: .Note written in EPIC  PLAN OF CARE: Discharge to home after PACU  PATIENT DISPOSITION:  PACU - hemodynamically stable.   Delay start of Pharmacological VTE agent (>24hrs) due to surgical blood loss or risk of bleeding: not applicable

## 2016-10-31 NOTE — Interval H&P Note (Signed)
History and Physical Interval Note:  10/31/2016 12:56 PM  [name]  has presented today for surgery, with the diagnosis of Pelvic Pain 90 minutes  The various methods of treatment have been discussed with the patient and family. After consideration of risks, benefits and other options for treatment, the patient has consented to  Procedure(s): LAPAROSCOPY OPERATIVE possible Fulguration or resection (N/A) as a surgical intervention, DIAGNOSTIC LAPAROSCOPY .  The patient's history has been reviewed, patient examined, no change in status, stable for surgery.  I have reviewed the patient's chart and labs.  Questions were answered to the patient's satisfaction.     Konrad FelixKULWA,Floreine Kingdon WAKURU, MD.

## 2016-10-31 NOTE — Discharge Instructions (Signed)
°  Post Anesthesia Home Care Instructions  Activity: Get plenty of rest for the remainder of the day. A responsible adult should stay with you for 24 hours following the procedure.  For the next 24 hours, DO NOT: -Drive a car -Advertising copywriterperate machinery -Drink alcoholic beverages -Take any medication unless instructed by your physician -Make any legal decisions or sign important papers.  Meals: Start with liquid foods such as gelatin or soup. Progress to regular foods as tolerated. Avoid greasy, spicy, heavy foods. If nausea and/or vomiting occur, drink only clear liquids until the nausea and/or vomiting subsides. Call your physician if vomiting continues.  Special Instructions/Symptoms: Your throat may feel dry or sore from the anesthesia or the breathing tube placed in your throat during surgery. If this causes discomfort, gargle with warm salt water. The discomfort should disappear within 24 hours.  If you had a scopolamine patch placed behind your ear for the management of post- operative nausea and/or vomiting:  1. The medication in the patch is effective for 72 hours, after which it should be removed.  Wrap patch in a tissue and discard in the trash. Wash hands thoroughly with soap and water. 2. You may remove the patch earlier than 72 hours if you experience unpleasant side effects which may include dry mouth, dizziness or visual disturbances. 3. Avoid touching the patch. Wash your hands with soap and water after contact with the patch.    Ms. Herbert SetaHeather,  1. You may remove the dressing in 3 days (11/03/16).   2. After showering please ensure that the incision is patted dry with a towel. 3. Do not have any intercourse for the next two weeks.  4.  Expect incisional soreness and some upper back pain which could be from the gas that was used during the procedure.  Be sure to take your pain medication and ambulate regularly.    5. You may take over the counter stool softener if with constipation.    6.  Please call me with any problems or questions.  7.  Please keep your upcoming appointment in the office as scheduled.  I wish you a quick recovery.    Dr. Sallye OberKulwa.  Phone: [phone]

## 2016-10-31 NOTE — H&P (Addendum)
[name] is an 31 y.o. female with pelvic pain unresponsive to pain and hormonal medication here for diagnostic laparoscopy, possible operative laparoscopy.  With history of ovarian cysts  (Pelvic ultrasound 09/09/16 showed right ovary with 3.6 cm hemorrhagic cyst, normal left ovary, normal uterus with IUD in place.  But most recent ultrasound (10/22/16) showed resolved ovarian cyst, normal adnexa.    Pertinent Gynecological History: Menses: irregular, hx of PCOS Contraception: IUD DES exposure: unknown Blood transfusions: none Sexually transmitted diseases: no past history Previous GYN Procedures: None Last pap: 02/2016: negative OB History: G1, P0101   Past Medical History:  Diagnosis Date  . Allergy    seasonal  . Anxiety   . Aspergillosis (HCC)    sinus  . Chronic idiopathic urticaria   . Complication of anesthesia    prolonged sedation, could not breath after tube removal from sinus surgery, required breathing treatment and was admitted for overnight observation  . Dermatographism   . GERD (gastroesophageal reflux disease)   . Herpes   . PCOS (polycystic ovarian syndrome)     Past Surgical History:  Procedure Laterality Date  . CERVICAL CERCLAGE    . KNEE SURGERY Right 2007  . NASAL SINUS SURGERY    . TONSILLECTOMY  2008  . WISDOM TOOTH EXTRACTION      Family History  Problem Relation Age of Onset  . Allergies Sister   . Allergies Mother   . Asthma Mother   . Allergies Father     Social History:  reports that she quit smoking about 5 months ago. Her smoking use included Cigarettes. She has a 1.00 pack-year smoking history. She has never used smokeless tobacco. She reports that she does not drink alcohol or use drugs.  Allergies:  Allergies  Allergen Reactions  . Penicillins Anaphylaxis    ++++ABLE TO TAKE ROCEPHIN++++ Has patient had a PCN reaction causing immediate rash, facial/tongue/throat swelling, SOB or lightheadedness with hypotension: yes Has  patient had a PCN reaction causing severe rash involving mucus membranes or skin necrosis: unknown Has patient had a PCN reaction that required hospitalization no Has patient had a PCN reaction occurring within the last 10 years: yes If all of the above answers are "NO", then may proceed with Cephalosporin use.   . Shrimp [Shellfish Allergy] Anaphylaxis  . Eggs Or Egg-Derived Products     Yolk-nausea  . Soy Allergy Nausea Only  . Wheat Bran Other (See Comments)    inflammation    HOME MEDICATION:  Citalopram Clonazepam Hydromorphone Lo lo estrin Metronidazole Prazosin  ROS  Constitutional: Denies fevers/chills Cardiovascular: Denies chest pain or palpitations Pulmonary: Denies coughing or wheezing Gastrointestinal: Denies nausea, vomiting or diarrhea Genitourinary: Denies unusual vaginal discharge, dysuria, urgency or frequency. With pelvic pain, with irregular periods.   Musculoskeletal: Denies muscle or joint aches and pain.  Neurology: Denies abnormal sensations such as tingling or numbness.   Physical Exam  Blood pressure 109/74, pulse 67, temperature 98 F (36.7 C), temperature source Oral, resp. rate 16, last menstrual period 10/30/2016, SpO2 100 %. Constitutional: She is oriented to person, place, and time. She appears well-developed and well-nourished.  HENT:  Head: Normocephalic and atraumatic.  Neck: Normal range of motion.  Cardiovascular: Normal rate, regular rhythm and normal heart sounds.   Respiratory: Effort normal and breath sounds normal.  GI: Soft. Bowel sounds are normal. Tender to palpation in lower abdomen and pelvic area bilaterally, right side greater than left side, no rebound, no guarding.  Neurological: She is  alert and oriented to person, place, and time.  Skin: Skin is warm and dry.  Psychiatric: She has a normal mood and affect. Her behavior is normal.  No results found for this or any previous visit (from the past 24 hour(s)).  CBC      Component Value Date/Time   WBC 11.6 (H) 10/29/2016 1230   RBC 4.64 10/29/2016 1230   HGB 14.1 10/29/2016 1230   HCT 42.1 10/29/2016 1230   PLT 217 10/29/2016 1230   MCV 90.7 10/29/2016 1230   MCH 30.4 10/29/2016 1230   MCHC 33.5 10/29/2016 1230   RDW 13.4 10/29/2016 1230   LYMPHSABS 3.2 09/14/2016 2245   MONOABS 0.5 09/14/2016 2245   EOSABS 0.3 09/14/2016 2245   BASOSABS 0.1 09/14/2016 2245    Pregnancy, urine  Order: [id number]  Status:  Final result Visible to patient:  No (Not Released) Next appt:  11/20/2016 at 09:45 AM in Gastroenterology Stan Head, MD)    Ref Range & Units 11:55 (10/31/16) 77mo ago (09/14/16) 77mo ago (09/09/16) 77mo ago (09/06/16) 48mo ago (07/14/16)   Preg Test, Ur NEGATIVE NEGATIVE  NEGATIVECM  NEGATIVECM  NEGATIVECM  NEGATIVECM   Comments:     THE SENSITIVITY OF THIS  METHODOLOGY IS >20 mIU/mL.           Assessment/Plan:  32 y/o P1 with about 5 month history of re-occuring pelvic pain, unresponsive to narcotic pain and hormonal medication here for diagnostic laparoscopy, possible operative laparoscopy,   Admit to Pre-op  -This procedure has been fully reviewed with the patient and written informed consent has been obtained from the patient.   We discussed the risks of bleeding, infection and damage to organs and possible need for laparotomy.  We discussed that if abnormalities are found during the procedure then she will require operative laparoscopy that may entail ovarian cystectomy or endometriosis biopsies, fulguration or resection.   We discussed that she may need additional surgeries in the future depending on the findings from the procedure today.  She expressed understanding.  All her questions were answered and she consented to the procedure.     Konrad Felix, MD 10/31/2016, 11:29 AM

## 2016-10-31 NOTE — Progress Notes (Signed)
Day of Surgery Procedure(s) (LRB): LAPAROSCOPIC LYSIS OF ADHESIONS (N/A) LAPAROSCOPY DIAGNOSTIC (N/A)  EVENTS IN PACU IMMEDIATELY POST OP, BOUT 3.30 PM.   Subjective: Patient reports chest pain on inhaling deeply.  She denies shortness of breath or palpitations or nausea or vomiting.     Objective: I have reviewed patient's vital signs. Blood pressure 121/63, pulse 70, temperature 98.2 F (36.8 C), resp. rate 16, last menstrual period 10/30/2016, SpO2 97 %. General: alert, cooperative and flushed Resp: clear to auscultation bilaterally Cardio: regular rate and rhythm, S1, S2 normal, no murmur, click, rub or gallop GI: incision: clean, dry and intact Extremities: extremities normal, atraumatic, no cyanosis or edema Vaginal Bleeding: minimal  Assessment: s/p Procedure(s): LAPAROSCOPIC LYSIS OF ADHESIONS (N/A) LAPAROSCOPY DIAGNOSTIC (N/A): with post op chest pain, possibly related to general endotracheal anesthesia chest tubing, versus history of anxiety, clinically appears stable, in no acute distress  Plan: -Patient coached on slow deep breathing - I discussed findings with anesthesia, Dr. Lilli LightHatchet and also reviewed with him  patient's prior history of having a difficult time recovering from anesthesia and requiring breathing treatments.  Anesthesia will closely follow up on patient  -Pain meds prn -RN in PACU notified to inform me if patient's condition does not improve.     LOS: 0 days    [facility] HospitalKULWA,[name] 10/31/2016, 9:02 PM

## 2016-10-31 NOTE — Addendum Note (Signed)
Addendum  created 10/31/16 1609 by Leilani AbleFranklin Sarie Stall, MD   Order list changed

## 2016-10-31 NOTE — Anesthesia Postprocedure Evaluation (Addendum)
Anesthesia Post Note  Patient: [name]  Procedure(s) Performed: Procedure(s) (LRB): LAPAROSCOPIC LYSIS OF ADHESIONS (N/A) LAPAROSCOPY DIAGNOSTIC (N/A)  Patient location during evaluation: PACU Anesthesia Type: General Level of consciousness: awake and sedated Pain management: pain level controlled Vital Signs Assessment: post-procedure vital signs reviewed and stable Respiratory status: spontaneous breathing Cardiovascular status: stable Postop Assessment: no signs of nausea or vomiting Anesthetic complications: no        Last Vitals:  Vitals:   10/31/16 1540 10/31/16 1542  BP:    Pulse: 65 69  Resp: (!) 8 13  Temp:      Last Pain:  Vitals:   10/31/16 1145  TempSrc: Oral  PainSc: 4    Pain Goal: Patients Stated Pain Goal: 4 (10/31/16 1145)               Shajuan Musso JR,JOHN Arav Bannister

## 2016-10-31 NOTE — Op Note (Addendum)
  [name], [name] Female, 30 y.o., March 17, 1986 MRN: [id number]  PREOP DIAGNOSIS: Pelvic pain  POSTOP DIAGNOSIS: Pelvic adhesions  PROCEDURE: Diagnostic laparoscopy, lysis of adhesions.   SURGEON: Dr. Hoover BrownsEma Kennidi Yoshida  ASSISTANT: Scrub tech: Raylene Miyamotoeone  ANESTHESIA: General.  COMPLICATIONS: None  EBL: 5 mL  IV FLUID: 1500 mL LR  URINE OUTPUT: 200 mL  FINDINGS: Normal uterus. Normal fallopian tubes bilaterally. Normal left and right ovaries.  Normal anterior and posterior cul de sacs.  Normal bladder.  Normal appendix.  Moderate adhesion between omentum and right pelvic wall, otherwise normal appearing bowel.    PROCEDURE:   Informed consent was obtained from the patient to undergo the procedure after discussing the risks benefits and alternatives of the procedure. She was taken to the operating room where anesthesia was administered without difficulty. Both arms were tucked and she was placed in the dorsal lithotomy position. She was prepped abdominally, vaginally and perineum in the usual sterile fashion. IUD strings were visualized through the cervical os.  Small menstrual blood was noted from the os.  Foley catheter was placed in the bladder and a gauze pad on a ring forcep was placed in the vagina for uterine manipulation.     Attention was then turned to the abdomen where lidocaine with epinephrine was instilled in the infraumbilical area. The skin was incised with a scalpel and and open entry into the abdomen was made through the subcutaneous fascia and peritoneal layers using Allis clamps hemostats and S retractors for retraction. Appendiceal retractors also had to be used as patient's abdomen was thick and peritoneum was difficult to access due to body habitus, finally, entry into peritoneum was made with metzenbaum scissors.  Small bleed in the fascia layer was contained with the bovie.  The fascia was tied with 0 Vicryl at the edges.  The Hasson trocar was placed in and gas 20  mL gas instilled into the bulb, abdominal pressure maintained at 15mmHg. The abdomen was insufflated with CO2 gas. The scope was then placed in and the pelvis was visualized. One 5 mm ports were placed the using the XL trocar on the right lower abdominal quadrant under direct visualization. The pelvis was inspected and the above findings were noted.  Pelvic adhesions were lysed with endo shears.  Small bleeding from where the adhesion had attached on the right lower anterior abdominal wall was contained with endo shears on coagulation.  The pelvis was inspected and it was noted to be hemostatic.  All instruments and trocars were removed under direct visualization.    Umbilical incision was closed using 0 Vicryl  Suture at fascia level.  The skin was closed with 4-0 Monocryl. The side ports incision was also closed with 4-0 Monocryl. Dermabond and Tegaderm dressings were placed. The Foley catheter and right forcep in vagina were removed.  The patient was then awoken from anesthesia and she was taken to recovery room in stable condition  SPECIMEN: None.   Katti Pelle Sallye OberKULWA, MD.

## 2016-10-31 NOTE — Anesthesia Preprocedure Evaluation (Signed)
Anesthesia Evaluation  Patient identified by MRN, date of birth, ID band Patient awake    Reviewed: Allergy & Precautions, H&P , NPO status , Patient's Chart, lab work & pertinent test results  Airway Mallampati: I  TM Distance: >3 FB Neck ROM: full    Dental no notable dental hx. (+) Teeth Intact   Pulmonary former smoker,    Pulmonary exam normal        Cardiovascular negative cardio ROS Normal cardiovascular exam     Neuro/Psych PSYCHIATRIC DISORDERS Anxiety negative neurological ROS     GI/Hepatic Neg liver ROS, GERD  Medicated and Controlled,  Endo/Other  negative endocrine ROS  Renal/GU negative Renal ROS     Musculoskeletal negative musculoskeletal ROS (+)   Abdominal Normal abdominal exam  (+)   Peds  Hematology negative hematology ROS (+)   Anesthesia Other Findings   Reproductive/Obstetrics negative OB ROS                             Anesthesia Physical Anesthesia Plan  ASA: II  Anesthesia Plan: General   Post-op Pain Management:    Induction: Intravenous  Airway Management Planned: Oral ETT  Additional Equipment:   Intra-op Plan:   Post-operative Plan: Extubation in OR  Informed Consent: I have reviewed the patients History and Physical, chart, labs and discussed the procedure including the risks, benefits and alternatives for the proposed anesthesia with the patient or authorized representative who has indicated his/her understanding and acceptance.   Dental Advisory Given  Plan Discussed with: CRNA and Surgeon  Anesthesia Plan Comments:         Anesthesia Quick Evaluation

## 2016-10-31 NOTE — Anesthesia Procedure Notes (Signed)
Procedure Name: Intubation Date/Time: 10/31/2016 1:30 PM Performed by: Tobin Chad Pre-anesthesia Checklist: Patient identified, Emergency Drugs available, Suction available and Patient being monitored Patient Re-evaluated:Patient Re-evaluated prior to inductionOxygen Delivery Method: Circle system utilized and Simple face mask Preoxygenation: Pre-oxygenation with 100% oxygen Intubation Type: IV induction and Inhalational induction Ventilation: Mask ventilation without difficulty Laryngoscope Size: Mac and 3 Grade View: Grade II Tube type: Oral Tube size: 7.0 mm Number of attempts: 1 Airway Equipment and Method: Stylet Placement Confirmation: ETT inserted through vocal cords under direct vision,  positive ETCO2 and breath sounds checked- equal and bilateral Secured at: 23 cm Tube secured with: Tape Dental Injury: Teeth and Oropharynx as per pre-operative assessment

## 2016-11-01 ENCOUNTER — Encounter (HOSPITAL_COMMUNITY): Payer: Self-pay | Admitting: Obstetrics & Gynecology

## 2016-11-07 DIAGNOSIS — G8918 Other acute postprocedural pain: Secondary | ICD-10-CM | POA: Diagnosis not present

## 2016-11-07 MED FILL — HYDROmorphone HCL 2 MG TABS: 2 | 3 days supply | Qty: 20 | Fill #0

## 2016-11-15 DIAGNOSIS — L905 Scar conditions and fibrosis of skin: Secondary | ICD-10-CM | POA: Insufficient documentation

## 2016-11-15 MED FILL — CITALOPRAM HBR 20 MG TABLET: 20 | 30 days supply | Qty: 45 | Fill #1

## 2016-11-15 MED FILL — clonazePAM 0.5 MG TABS: 0.5 | 25 days supply | Qty: 75 | Fill #1

## 2016-11-19 DIAGNOSIS — F4310 Post-traumatic stress disorder, unspecified: Secondary | ICD-10-CM | POA: Diagnosis not present

## 2016-11-19 MED FILL — DOXAZOSIN MESYLATE 2 MG TAB: 2 | 33 days supply | Qty: 60 | Fill #0

## 2016-11-19 MED FILL — QUETIAPINE FUMARATE 25 MG T: 25 | 30 days supply | Qty: 30 | Fill #0

## 2016-11-20 ENCOUNTER — Encounter: Payer: Self-pay | Admitting: Internal Medicine

## 2016-11-20 ENCOUNTER — Ambulatory Visit (INDEPENDENT_AMBULATORY_CARE_PROVIDER_SITE_OTHER): Payer: 59 | Admitting: Internal Medicine

## 2016-11-20 VITALS — BP 84/70 | HR 96 | Ht 63.75 in | Wt 179.2 lb

## 2016-11-20 DIAGNOSIS — K660 Peritoneal adhesions (postprocedural) (postinfection): Secondary | ICD-10-CM | POA: Diagnosis not present

## 2016-11-20 DIAGNOSIS — K2980 Duodenitis without bleeding: Secondary | ICD-10-CM

## 2016-11-20 NOTE — Progress Notes (Signed)
[name] 31 y.o. [birth year] [id number]  Assessment & Plan:   Encounter Diagnoses  Name Primary?  . Duodenitis Yes  . Abdominal adhesions    [name] doing very well at this time, hopefully she'll have a durable response to the easy lysis. I will see her back as needed she can come off the pantoprazole after 3 months of treatment. Would not commit her to long-term treatment for the peptic duodenitis that I saw at endoscopy.  I appreciate the opportunity to care for this patient. CC: Alexandra Falls, MD  Subjective:   Chief Complaint: Follow-up of duodenitis  HPI [name] returns, she saw me in the fall with diarrhea and abdominal discomfort he had upper abdominal pain and nausea that was more for problem. An upper endoscopy showed some duodenitis and I started her on pantoprazole. Also treated with dicyclomine. She went on to be discovered to have a right ovarian cyst, she underwent a laparoscopic exam looking for possible endometriosis etc. she had adhesions between the omentum and right pelvic wall that were taken down. Hence that time she's had some trouble with a hematoma and some postoperative pain but the other symptoms she has had her on gone.  Allergies  Allergen Reactions  . Penicillins Anaphylaxis    ++++ABLE TO TAKE ROCEPHIN++++ Has patient had a PCN reaction causing immediate rash, facial/tongue/throat swelling, SOB or lightheadedness with hypotension: yes Has patient had a PCN reaction causing severe rash involving mucus membranes or skin necrosis: unknown Has patient had a PCN reaction that required hospitalization no Has patient had a PCN reaction occurring within the last 10 years: yes If all of the above answers are "NO", then may proceed with Cephalosporin use.   . Shrimp [Shellfish Allergy] Anaphylaxis  . Eggs Or Egg-Derived Products     Yolk-nausea  . Soy Allergy Nausea Only  . Wheat Bran Other (See Comments)    inflammation   Current Meds    Medication Sig  . citalopram (CELEXA) 20 MG tablet Take 30 mg by mouth every evening. Takes 1.5 tablet  . clonazePAM (KLONOPIN) 0.5 MG tablet Take 0.5 mg by mouth 2 (two) times daily.   Marland Kitchen dicyclomine (BENTYL) 20 MG tablet Take 1 tablet (20 mg total) by mouth every 6 (six) hours. As needed for pain (Patient taking differently: Take 20 mg by mouth every 6 (six) hours as needed for spasms. As needed for pain)  . doxazosin (CARDURA) 2 MG tablet daily.  Marland Kitchen ibuprofen (ADVIL,MOTRIN) 800 MG tablet Take 1 tablet (800 mg total) by mouth every 8 (eight) hours as needed for moderate pain or cramping.  . LO LOESTRIN FE 1 MG-10 MCG / 10 MCG tablet TAKE 1 TABLET DAILY  . metFORMIN (GLUCOPHAGE) 500 MG tablet Take 500 mg by mouth daily.  . ondansetron (ZOFRAN ODT) 8 MG disintegrating tablet Take 1 tablet (8 mg total) by mouth every 8 (eight) hours as needed for nausea or vomiting.  . pantoprazole (PROTONIX) 40 MG tablet Take 1 tablet (40 mg total) by mouth daily. 30 minutes before breakfast  . QUEtiapine (SEROQUEL) 25 MG tablet daily.   Past Medical History:  Diagnosis Date  . Allergy    seasonal  . Anxiety   . Aspergillosis (HCC)    sinus  . Chronic idiopathic urticaria   . Complication of anesthesia    prolonged sedation, could not breath after tube removal from sinus surgery, required breathing treatment and was admitted for overnight observation  . Dermatographism   . Duodenitis   .  GERD (gastroesophageal reflux disease)   . Herpes   . PCOS (polycystic ovarian syndrome)   . PONV (postoperative nausea and vomiting)    vomiting after tonsillectomy, 2008   Past Surgical History:  Procedure Laterality Date  . CERVICAL CERCLAGE    . ESOPHAGOGASTRODUODENOSCOPY    . KNEE SURGERY Right 2007  . LAPAROSCOPIC LYSIS OF ADHESIONS N/A 10/31/2016   Procedure: LAPAROSCOPIC LYSIS OF ADHESIONS;  Surgeon: Hoover BrownsEma Kulwa, MD;  Location: [facility];  Service: Gynecology;  Laterality: N/A;  . LAPAROSCOPY N/A 10/31/2016    Procedure: LAPAROSCOPY DIAGNOSTIC;  Surgeon: Hoover BrownsEma Kulwa, MD;  Location: [facility];  Service: Gynecology;  Laterality: N/A;  . NASAL SINUS SURGERY    . TONSILLECTOMY  2008  . WISDOM TOOTH EXTRACTION       Review of Systems As above  Objective:   Physical Exam BP (!) 84/70   Pulse 96   Ht 5' 3.75" (1.619 m)   Wt 179 lb 4 oz (81.3 kg)   LMP 10/30/2016   BMI 31.01 kg/m  NAD  Data reviewed include op notes, imaging studies pathology reports prior endoscopy and recent labs  15 minutes time spent with patient > half in counseling coordination of care

## 2016-11-20 NOTE — Patient Instructions (Addendum)
   Glad your better.    You may try and come off your pantoprazole in a month.  If you have symptoms you could go to one every other day for 2 weeks.    Follow up with Dr Leone PayorGessner as needed.    I appreciate the opportunity to care for you. Stan Headarl Gessner, MD, [facility]

## 2016-11-23 DIAGNOSIS — D72829 Elevated white blood cell count, unspecified: Secondary | ICD-10-CM | POA: Diagnosis not present

## 2016-11-23 DIAGNOSIS — Z719 Counseling, unspecified: Secondary | ICD-10-CM | POA: Diagnosis not present

## 2016-11-23 DIAGNOSIS — R6889 Other general symptoms and signs: Secondary | ICD-10-CM | POA: Diagnosis not present

## 2016-11-23 DIAGNOSIS — R112 Nausea with vomiting, unspecified: Secondary | ICD-10-CM | POA: Diagnosis not present

## 2016-11-26 DIAGNOSIS — L501 Idiopathic urticaria: Secondary | ICD-10-CM | POA: Diagnosis not present

## 2016-11-26 DIAGNOSIS — J029 Acute pharyngitis, unspecified: Secondary | ICD-10-CM | POA: Diagnosis not present

## 2016-12-03 DIAGNOSIS — F4310 Post-traumatic stress disorder, unspecified: Secondary | ICD-10-CM | POA: Diagnosis not present

## 2016-12-03 MED FILL — hydrOXYzine HCL 50 MG TABS: 50 | 90 days supply | Qty: 360 | Fill #0

## 2016-12-06 DIAGNOSIS — J069 Acute upper respiratory infection, unspecified: Secondary | ICD-10-CM | POA: Diagnosis not present

## 2016-12-06 DIAGNOSIS — Z719 Counseling, unspecified: Secondary | ICD-10-CM | POA: Diagnosis not present

## 2016-12-06 DIAGNOSIS — J029 Acute pharyngitis, unspecified: Secondary | ICD-10-CM | POA: Diagnosis not present

## 2016-12-09 DIAGNOSIS — Z6830 Body mass index (BMI) 30.0-30.9, adult: Secondary | ICD-10-CM | POA: Diagnosis not present

## 2016-12-09 DIAGNOSIS — Z139 Encounter for screening, unspecified: Secondary | ICD-10-CM | POA: Diagnosis not present

## 2016-12-09 DIAGNOSIS — J069 Acute upper respiratory infection, unspecified: Secondary | ICD-10-CM | POA: Diagnosis not present

## 2016-12-17 DIAGNOSIS — F4310 Post-traumatic stress disorder, unspecified: Secondary | ICD-10-CM | POA: Diagnosis not present

## 2016-12-17 MED FILL — CITALOPRAM HBR 20 MG TABLET: 20 | 90 days supply | Qty: 135 | Fill #0

## 2016-12-17 MED FILL — DOXAZOSIN MESYLATE 4 MG TAB: 4 | 90 days supply | Qty: 90 | Fill #0

## 2016-12-17 MED FILL — LORazepam 1 MG TABS: 1 | 30 days supply | Qty: 60 | Fill #0

## 2016-12-18 DIAGNOSIS — Z7689 Persons encountering health services in other specified circumstances: Secondary | ICD-10-CM | POA: Diagnosis not present

## 2016-12-18 DIAGNOSIS — J111 Influenza due to unidentified influenza virus with other respiratory manifestations: Secondary | ICD-10-CM | POA: Diagnosis not present

## 2016-12-18 DIAGNOSIS — J069 Acute upper respiratory infection, unspecified: Secondary | ICD-10-CM | POA: Diagnosis not present

## 2016-12-18 DIAGNOSIS — R6889 Other general symptoms and signs: Secondary | ICD-10-CM | POA: Diagnosis not present

## 2016-12-30 DIAGNOSIS — S60221A Contusion of right hand, initial encounter: Secondary | ICD-10-CM | POA: Diagnosis not present

## 2017-01-07 DIAGNOSIS — F4310 Post-traumatic stress disorder, unspecified: Secondary | ICD-10-CM | POA: Diagnosis not present

## 2017-01-14 DIAGNOSIS — F4310 Post-traumatic stress disorder, unspecified: Secondary | ICD-10-CM | POA: Diagnosis not present

## 2017-01-21 MED FILL — LORazepam 1 MG TABS: 1 | 30 days supply | Qty: 60 | Fill #1

## 2017-02-06 DIAGNOSIS — F4310 Post-traumatic stress disorder, unspecified: Secondary | ICD-10-CM | POA: Diagnosis not present

## 2017-02-13 DIAGNOSIS — F4310 Post-traumatic stress disorder, unspecified: Secondary | ICD-10-CM | POA: Diagnosis not present

## 2017-02-28 DIAGNOSIS — F4310 Post-traumatic stress disorder, unspecified: Secondary | ICD-10-CM | POA: Diagnosis not present

## 2017-02-28 MED FILL — HYDROXYCHLOROQUINE 200 MG T: 200 | 90 days supply | Qty: 180 | Fill #0

## 2017-02-28 MED FILL — LORazepam 0.5 MG TABS: 0.5 | 30 days supply | Qty: 90 | Fill #0

## 2017-02-28 NOTE — Addendum Note (Signed)
Addendum  created 02/28/17 0957 by Neesha Langton, MD   Sign clinical note    

## 2017-03-14 DIAGNOSIS — R05 Cough: Secondary | ICD-10-CM | POA: Diagnosis not present

## 2017-03-14 DIAGNOSIS — Z719 Counseling, unspecified: Secondary | ICD-10-CM | POA: Diagnosis not present

## 2017-03-14 DIAGNOSIS — J209 Acute bronchitis, unspecified: Secondary | ICD-10-CM | POA: Diagnosis not present

## 2017-03-14 DIAGNOSIS — Z6831 Body mass index (BMI) 31.0-31.9, adult: Secondary | ICD-10-CM | POA: Diagnosis not present

## 2017-03-18 DIAGNOSIS — J40 Bronchitis, not specified as acute or chronic: Secondary | ICD-10-CM | POA: Diagnosis not present

## 2017-03-18 DIAGNOSIS — H6120 Impacted cerumen, unspecified ear: Secondary | ICD-10-CM | POA: Diagnosis not present

## 2017-03-18 DIAGNOSIS — H9202 Otalgia, left ear: Secondary | ICD-10-CM | POA: Diagnosis not present

## 2017-03-18 DIAGNOSIS — Z789 Other specified health status: Secondary | ICD-10-CM | POA: Diagnosis not present

## 2017-03-28 DIAGNOSIS — H60392 Other infective otitis externa, left ear: Secondary | ICD-10-CM | POA: Diagnosis not present

## 2017-03-28 DIAGNOSIS — J40 Bronchitis, not specified as acute or chronic: Secondary | ICD-10-CM | POA: Diagnosis not present

## 2017-03-28 DIAGNOSIS — H6121 Impacted cerumen, right ear: Secondary | ICD-10-CM | POA: Diagnosis not present

## 2017-04-15 DIAGNOSIS — F4310 Post-traumatic stress disorder, unspecified: Secondary | ICD-10-CM | POA: Diagnosis not present

## 2017-04-15 MED FILL — ALPRAZolam 0.25 MG TABS: 0.25 | 30 days supply | Qty: 60 | Fill #0

## 2017-04-15 MED FILL — CITALOPRAM HBR 20 MG TABLET: 20 | 90 days supply | Qty: 135 | Fill #0

## 2017-04-15 MED FILL — DOXAZOSIN MESYLATE 4 MG TAB: 4 | 90 days supply | Qty: 90 | Fill #0

## 2017-04-15 MED FILL — busPIRone HCL 15 MG TABS: 15 | 30 days supply | Qty: 60 | Fill #0

## 2017-06-16 DIAGNOSIS — S20219A Contusion of unspecified front wall of thorax, initial encounter: Secondary | ICD-10-CM | POA: Diagnosis not present

## 2017-06-16 DIAGNOSIS — S0990XA Unspecified injury of head, initial encounter: Secondary | ICD-10-CM | POA: Diagnosis not present

## 2017-06-16 DIAGNOSIS — Z720 Tobacco use: Secondary | ICD-10-CM | POA: Diagnosis not present

## 2017-06-16 DIAGNOSIS — N390 Urinary tract infection, site not specified: Secondary | ICD-10-CM | POA: Diagnosis not present

## 2017-06-16 DIAGNOSIS — S0083XA Contusion of other part of head, initial encounter: Secondary | ICD-10-CM | POA: Diagnosis not present

## 2017-06-16 DIAGNOSIS — Z880 Allergy status to penicillin: Secondary | ICD-10-CM | POA: Diagnosis not present

## 2017-06-16 DIAGNOSIS — S0090XA Unspecified superficial injury of unspecified part of head, initial encounter: Secondary | ICD-10-CM | POA: Diagnosis not present

## 2017-06-16 DIAGNOSIS — G4489 Other headache syndrome: Secondary | ICD-10-CM | POA: Diagnosis not present

## 2017-06-18 DIAGNOSIS — S0993XA Unspecified injury of face, initial encounter: Secondary | ICD-10-CM | POA: Diagnosis not present

## 2017-06-18 DIAGNOSIS — M25511 Pain in right shoulder: Secondary | ICD-10-CM | POA: Diagnosis not present

## 2017-06-27 DIAGNOSIS — F4310 Post-traumatic stress disorder, unspecified: Secondary | ICD-10-CM | POA: Diagnosis not present

## 2017-07-01 DIAGNOSIS — R0981 Nasal congestion: Secondary | ICD-10-CM | POA: Diagnosis not present

## 2017-07-01 DIAGNOSIS — S0992XA Unspecified injury of nose, initial encounter: Secondary | ICD-10-CM | POA: Diagnosis not present

## 2017-07-01 DIAGNOSIS — J342 Deviated nasal septum: Secondary | ICD-10-CM | POA: Diagnosis not present

## 2017-08-16 ENCOUNTER — Encounter (HOSPITAL_COMMUNITY): Payer: Self-pay

## 2017-08-16 ENCOUNTER — Inpatient Hospital Stay (HOSPITAL_COMMUNITY): Payer: 59 | Admitting: Certified Registered"

## 2017-08-16 ENCOUNTER — Inpatient Hospital Stay (HOSPITAL_COMMUNITY): Payer: 59

## 2017-08-16 ENCOUNTER — Encounter (HOSPITAL_COMMUNITY)
Admission: AD | Disposition: A | Discharge status: Home / Self Care | Payer: Self-pay | Source: Other Acute Inpatient Hospital | Attending: General Surgery

## 2017-08-16 ENCOUNTER — Observation Stay (HOSPITAL_COMMUNITY)
Admission: AD | Admit: 2017-08-16 | Discharge: 2017-08-17 | Disposition: A | Discharge status: Home / Self Care | Payer: 59 | Source: Other Acute Inpatient Hospital | Attending: General Surgery | Admitting: General Surgery

## 2017-08-16 ENCOUNTER — Inpatient Hospital Stay (HOSPITAL_COMMUNITY)
Admission: AD | Admit: 2017-08-16 | Discharge: 2017-08-16 | Disposition: A | Discharge status: Home / Self Care | Payer: 59 | Source: Ambulatory Visit | Attending: Obstetrics and Gynecology | Admitting: Obstetrics and Gynecology

## 2017-08-16 ENCOUNTER — Other Ambulatory Visit: Payer: Self-pay

## 2017-08-16 DIAGNOSIS — K353 Acute appendicitis with localized peritonitis, without perforation or gangrene: Secondary | ICD-10-CM | POA: Diagnosis not present

## 2017-08-16 DIAGNOSIS — Z79899 Other long term (current) drug therapy: Secondary | ICD-10-CM | POA: Insufficient documentation

## 2017-08-16 DIAGNOSIS — Z87891 Personal history of nicotine dependence: Secondary | ICD-10-CM | POA: Diagnosis not present

## 2017-08-16 DIAGNOSIS — R1030 Lower abdominal pain, unspecified: Secondary | ICD-10-CM | POA: Diagnosis not present

## 2017-08-16 DIAGNOSIS — K3580 Unspecified acute appendicitis: Secondary | ICD-10-CM | POA: Diagnosis not present

## 2017-08-16 DIAGNOSIS — K219 Gastro-esophageal reflux disease without esophagitis: Secondary | ICD-10-CM | POA: Diagnosis not present

## 2017-08-16 DIAGNOSIS — F419 Anxiety disorder, unspecified: Secondary | ICD-10-CM | POA: Insufficient documentation

## 2017-08-16 DIAGNOSIS — R109 Unspecified abdominal pain: Secondary | ICD-10-CM

## 2017-08-16 DIAGNOSIS — K658 Other peritonitis: Secondary | ICD-10-CM | POA: Diagnosis not present

## 2017-08-16 DIAGNOSIS — R05 Cough: Secondary | ICD-10-CM | POA: Diagnosis not present

## 2017-08-16 HISTORY — PX: LAPAROSCOPIC APPENDECTOMY: SHX408

## 2017-08-16 LAB — WET PREP, GENITAL
Clue Cells Wet Prep HPF POC: NONE SEEN
Sperm: NONE SEEN
Trich, Wet Prep: NONE SEEN
WBC, Wet Prep HPF POC: NONE SEEN
Yeast Wet Prep HPF POC: NONE SEEN

## 2017-08-16 LAB — CBC
HCT: 41.6 % (ref 36.0–46.0)
Hemoglobin: 13.7 g/dL (ref 12.0–15.0)
MCH: 30.1 pg (ref 26.0–34.0)
MCHC: 32.9 g/dL (ref 30.0–36.0)
MCV: 91.4 fL (ref 78.0–100.0)
Platelets: 257 10*3/uL (ref 150–400)
RBC: 4.55 MIL/uL (ref 3.87–5.11)
RDW: 12.3 % (ref 11.5–15.5)
WBC: 13.7 10*3/uL — ABNORMAL HIGH (ref 4.0–10.5)

## 2017-08-16 LAB — POCT PREGNANCY, URINE: PREG TEST UR: NEGATIVE

## 2017-08-16 LAB — SURGICAL PCR SCREEN
MRSA, PCR: POSITIVE — AB
Staphylococcus aureus: POSITIVE — AB

## 2017-08-16 LAB — URINALYSIS, ROUTINE W REFLEX MICROSCOPIC
BACTERIA UA: NONE SEEN
Bilirubin Urine: NEGATIVE
Glucose, UA: NEGATIVE mg/dL
Ketones, ur: NEGATIVE mg/dL
Leukocytes, UA: NEGATIVE
Nitrite: NEGATIVE
PROTEIN: NEGATIVE mg/dL
Specific Gravity, Urine: 1.031 — ABNORMAL HIGH (ref 1.005–1.030)
pH: 5 (ref 5.0–8.0)

## 2017-08-16 SURGERY — APPENDECTOMY, LAPAROSCOPIC
Anesthesia: General | Site: Abdomen

## 2017-08-16 MED ORDER — CIPROFLOXACIN IN D5W 400 MG/200ML IV SOLN
400.0000 mg | Freq: Once | INTRAVENOUS | Status: AC
  Administered 2017-08-16: 400 mg via INTRAVENOUS

## 2017-08-16 MED ORDER — SCOPOLAMINE 1 MG/3DAYS TD PT72
MEDICATED_PATCH | TRANSDERMAL | Status: AC
  Filled 2017-08-16: qty 1

## 2017-08-16 MED ORDER — AZITHROMYCIN 250 MG PO TABS: 1000.0000 mg | ORAL_TABLET | Freq: Once | ORAL | Status: DC

## 2017-08-16 MED ORDER — ONDANSETRON HCL 4 MG/2ML IJ SOLN: 4.0000 mg | Freq: Four times a day (QID) | INTRAMUSCULAR | Status: DC | PRN

## 2017-08-16 MED ORDER — LACTATED RINGERS IR SOLN
Status: DC | PRN
  Administered 2017-08-16: 1000 mL

## 2017-08-16 MED ORDER — MIDAZOLAM HCL 2 MG/2ML IJ SOLN
INTRAMUSCULAR | Status: DC | PRN
  Administered 2017-08-16: 1 mg via INTRAVENOUS

## 2017-08-16 MED ORDER — METRONIDAZOLE IN NACL 5-0.79 MG/ML-% IV SOLN
500.0000 mg | Freq: Once | INTRAVENOUS | Status: AC
Start: 2017-08-16 — End: 2017-08-16
  Administered 2017-08-16: 500 mg via INTRAVENOUS
  Filled 2017-08-16 (×2): qty 100

## 2017-08-16 MED ORDER — METFORMIN HCL 500 MG PO TABS
500.0000 mg | ORAL_TABLET | Freq: Every day | ORAL | Status: DC
  Administered 2017-08-17: 500 mg via ORAL
  Filled 2017-08-16: qty 1

## 2017-08-16 MED ORDER — LIDOCAINE 2% (20 MG/ML) 5 ML SYRINGE
INTRAMUSCULAR | Status: DC | PRN
  Administered 2017-08-16: 60 mg via INTRAVENOUS
  Administered 2017-08-16: 40 mg via INTRAVENOUS

## 2017-08-16 MED ORDER — 0.9 % SODIUM CHLORIDE (POUR BTL) OPTIME
TOPICAL | Status: DC | PRN
  Administered 2017-08-16: 1000 mL

## 2017-08-16 MED ORDER — OXYCODONE-ACETAMINOPHEN 5-325 MG PO TABS
1.0000 | ORAL_TABLET | Freq: Once | ORAL | Status: DC
Start: 2017-08-16 — End: 2017-08-16

## 2017-08-16 MED ORDER — BUPIVACAINE-EPINEPHRINE 0.25% -1:200000 IJ SOLN
INTRAMUSCULAR | Status: DC | PRN
  Administered 2017-08-16: 20 mL

## 2017-08-16 MED ORDER — SUCCINYLCHOLINE CHLORIDE 200 MG/10ML IV SOSY
PREFILLED_SYRINGE | INTRAVENOUS | Status: DC | PRN
  Administered 2017-08-16: 100 mg via INTRAVENOUS

## 2017-08-16 MED ORDER — ROCURONIUM BROMIDE 10 MG/ML (PF) SYRINGE
PREFILLED_SYRINGE | INTRAVENOUS | Status: DC | PRN
  Administered 2017-08-16: 30 mg via INTRAVENOUS

## 2017-08-16 MED ORDER — CIPROFLOXACIN IN D5W 400 MG/200ML IV SOLN
INTRAVENOUS | Status: AC
  Filled 2017-08-16: qty 200

## 2017-08-16 MED ORDER — MEPERIDINE HCL 50 MG/ML IJ SOLN: 6.2500 mg | INTRAMUSCULAR | Status: DC | PRN

## 2017-08-16 MED ORDER — HYDROMORPHONE HCL 1 MG/ML IJ SOLN
1.0000 mg | Freq: Once | INTRAMUSCULAR | Status: AC
  Administered 2017-08-16: 1 mg via INTRAVENOUS
  Filled 2017-08-16: qty 1

## 2017-08-16 MED ORDER — DOXAZOSIN MESYLATE 2 MG PO TABS
2.0000 mg | ORAL_TABLET | Freq: Every day | ORAL | Status: DC
  Administered 2017-08-16: 2 mg via ORAL
  Filled 2017-08-16: qty 1

## 2017-08-16 MED ORDER — ALPRAZOLAM 0.25 MG PO TABS: 0.2500 mg | ORAL_TABLET | Freq: Two times a day (BID) | ORAL | Status: DC | PRN

## 2017-08-16 MED ORDER — MIDAZOLAM HCL 2 MG/2ML IJ SOLN
INTRAMUSCULAR | Status: AC
  Filled 2017-08-16: qty 2

## 2017-08-16 MED ORDER — HYDROXYZINE HCL 25 MG PO TABS
25.0000 mg | ORAL_TABLET | Freq: Every day | ORAL | Status: DC
  Administered 2017-08-16: 25 mg via ORAL
  Filled 2017-08-16: qty 2

## 2017-08-16 MED ORDER — CHLORHEXIDINE GLUCONATE CLOTH 2 % EX PADS
6.0000 | MEDICATED_PAD | Freq: Every day | CUTANEOUS | Status: DC
  Administered 2017-08-17: 6 via TOPICAL

## 2017-08-16 MED ORDER — HYDROMORPHONE HCL 1 MG/ML IJ SOLN
INTRAMUSCULAR | Status: AC
Start: 2017-08-16 — End: 2017-08-17
  Filled 2017-08-16: qty 1

## 2017-08-16 MED ORDER — SCOPOLAMINE 1 MG/3DAYS TD PT72
MEDICATED_PATCH | TRANSDERMAL | Status: DC | PRN
  Administered 2017-08-16: 1 via TRANSDERMAL

## 2017-08-16 MED ORDER — IBUPROFEN 800 MG PO TABS
800.0000 mg | ORAL_TABLET | Freq: Three times a day (TID) | ORAL | Status: DC | PRN
  Administered 2017-08-17: 800 mg via ORAL
  Filled 2017-08-16: qty 1

## 2017-08-16 MED ORDER — MUPIROCIN 2 % EX OINT
1.0000 "application " | TOPICAL_OINTMENT | Freq: Two times a day (BID) | CUTANEOUS | Status: DC
  Administered 2017-08-16 – 2017-08-17 (×2): 1 via NASAL
  Filled 2017-08-16: qty 22

## 2017-08-16 MED ORDER — IOPAMIDOL (ISOVUE-300) INJECTION 61%
100.0000 mL | Freq: Once | INTRAVENOUS | Status: AC | PRN
  Administered 2017-08-16: 100 mL via INTRAVENOUS

## 2017-08-16 MED ORDER — PROPOFOL 10 MG/ML IV BOLUS
INTRAVENOUS | Status: AC
  Filled 2017-08-16: qty 20

## 2017-08-16 MED ORDER — DEXTROSE IN LACTATED RINGERS 5 % IV SOLN
INTRAVENOUS | Status: DC
  Administered 2017-08-16 – 2017-08-17 (×2): via INTRAVENOUS

## 2017-08-16 MED ORDER — FENTANYL CITRATE (PF) 250 MCG/5ML IJ SOLN
INTRAMUSCULAR | Status: DC | PRN
  Administered 2017-08-16 (×2): 50 ug via INTRAVENOUS

## 2017-08-16 MED ORDER — IOPAMIDOL (ISOVUE-300) INJECTION 61%
30.0000 mL | INTRAVENOUS | Status: AC
  Administered 2017-08-16: 30 mL via ORAL

## 2017-08-16 MED ORDER — DEXAMETHASONE SODIUM PHOSPHATE 10 MG/ML IJ SOLN
INTRAMUSCULAR | Status: DC | PRN
  Administered 2017-08-16: 10 mg via INTRAVENOUS

## 2017-08-16 MED ORDER — MIDAZOLAM HCL 2 MG/2ML IJ SOLN: 0.5000 mg | Freq: Once | INTRAMUSCULAR | Status: DC | PRN

## 2017-08-16 MED ORDER — HYDROMORPHONE HCL 1 MG/ML IJ SOLN
0.2500 mg | INTRAMUSCULAR | Status: DC | PRN
  Administered 2017-08-16: 0.5 mg via INTRAVENOUS

## 2017-08-16 MED ORDER — OXYCODONE HCL 5 MG PO TABS
5.0000 mg | ORAL_TABLET | ORAL | Status: DC | PRN
  Administered 2017-08-16 – 2017-08-17 (×2): 10 mg via ORAL
  Filled 2017-08-16 (×2): qty 2

## 2017-08-16 MED ORDER — FENTANYL CITRATE (PF) 250 MCG/5ML IJ SOLN
INTRAMUSCULAR | Status: AC
  Filled 2017-08-16: qty 5

## 2017-08-16 MED ORDER — BUPIVACAINE-EPINEPHRINE (PF) 0.25% -1:200000 IJ SOLN
INTRAMUSCULAR | Status: AC
  Filled 2017-08-16: qty 30

## 2017-08-16 MED ORDER — PROPOFOL 10 MG/ML IV BOLUS
INTRAVENOUS | Status: DC | PRN
  Administered 2017-08-16: 200 mg via INTRAVENOUS

## 2017-08-16 MED ORDER — PROMETHAZINE HCL 25 MG/ML IJ SOLN: 6.2500 mg | INTRAMUSCULAR | Status: DC | PRN

## 2017-08-16 MED ORDER — SUGAMMADEX SODIUM 200 MG/2ML IV SOLN
INTRAVENOUS | Status: DC | PRN
  Administered 2017-08-16: 175 mg via INTRAVENOUS

## 2017-08-16 MED ORDER — ENOXAPARIN SODIUM 40 MG/0.4ML ~~LOC~~ SOLN
40.0000 mg | SUBCUTANEOUS | Status: DC
  Filled 2017-08-16 (×2): qty 0.4

## 2017-08-16 MED ORDER — CITALOPRAM HYDROBROMIDE 20 MG PO TABS
30.0000 mg | ORAL_TABLET | Freq: Every evening | ORAL | Status: DC
  Administered 2017-08-16: 30 mg via ORAL
  Filled 2017-08-16: qty 2

## 2017-08-16 MED ORDER — SODIUM CHLORIDE 0.9 % IV SOLN
INTRAVENOUS | Status: DC | PRN
  Administered 2017-08-16: 14:00:00 via INTRAVENOUS

## 2017-08-16 MED ORDER — KETOROLAC TROMETHAMINE 10 MG PO TABS
20.0000 mg | ORAL_TABLET | Freq: Once | ORAL | Status: AC
  Administered 2017-08-16: 20 mg via ORAL
  Filled 2017-08-16: qty 2

## 2017-08-16 MED ORDER — OXYCODONE-ACETAMINOPHEN 5-325 MG PO TABS
1.0000 | ORAL_TABLET | Freq: Once | ORAL | Status: AC
  Administered 2017-08-16: 1 via ORAL
  Filled 2017-08-16: qty 1

## 2017-08-16 MED ORDER — LACTATED RINGERS IV SOLN
INTRAVENOUS | Status: DC | PRN
  Administered 2017-08-16: 15:00:00 via INTRAVENOUS

## 2017-08-16 MED ORDER — ONDANSETRON HCL 4 MG/2ML IJ SOLN
INTRAMUSCULAR | Status: DC | PRN
  Administered 2017-08-16: 4 mg via INTRAVENOUS

## 2017-08-16 MED ORDER — ONDANSETRON 4 MG PO TBDP: 4.0000 mg | ORAL_TABLET | Freq: Four times a day (QID) | ORAL | Status: DC | PRN

## 2017-08-16 MED ORDER — MORPHINE SULFATE (PF) 2 MG/ML IV SOLN
2.0000 mg | INTRAVENOUS | Status: DC | PRN
  Administered 2017-08-16 – 2017-08-17 (×3): 2 mg via INTRAVENOUS
  Filled 2017-08-16 (×3): qty 1

## 2017-08-16 SURGICAL SUPPLY — 33 items
APPLIER CLIP 5 13 M/L LIGAMAX5 (MISCELLANEOUS)
APPLIER CLIP ROT 10 11.4 M/L (STAPLE)
CABLE HIGH FREQUENCY MONO STRZ (ELECTRODE) IMPLANT
CHLORAPREP W/TINT 26ML (MISCELLANEOUS) ×3 IMPLANT
CLIP APPLIE 5 13 M/L LIGAMAX5 (MISCELLANEOUS) IMPLANT
CLIP APPLIE ROT 10 11.4 M/L (STAPLE) IMPLANT
COVER SURGICAL LIGHT HANDLE (MISCELLANEOUS) ×3 IMPLANT
CUTTER FLEX LINEAR 45M (STAPLE) ×3 IMPLANT
DECANTER SPIKE VIAL GLASS SM (MISCELLANEOUS) ×3 IMPLANT
DERMABOND ADVANCED (GAUZE/BANDAGES/DRESSINGS) ×4
DERMABOND ADVANCED .7 DNX12 (GAUZE/BANDAGES/DRESSINGS) ×2 IMPLANT
DRAPE LAPAROSCOPIC ABDOMINAL (DRAPES) ×3 IMPLANT
ELECT REM PT RETURN 15FT ADLT (MISCELLANEOUS) ×3 IMPLANT
GLOVE BIOGEL PI IND STRL 7.5 (GLOVE) ×1 IMPLANT
GLOVE BIOGEL PI INDICATOR 7.5 (GLOVE) ×2
GLOVE ECLIPSE 7.5 STRL STRAW (GLOVE) ×3 IMPLANT
GOWN STRL REUS W/TWL XL LVL3 (GOWN DISPOSABLE) ×6 IMPLANT
KIT BASIN OR (CUSTOM PROCEDURE TRAY) ×3 IMPLANT
PAD POSITIONING PINK XL (MISCELLANEOUS) ×3 IMPLANT
POUCH SPECIMEN RETRIEVAL 10MM (ENDOMECHANICALS) ×3 IMPLANT
RELOAD 45 VASCULAR/THIN (ENDOMECHANICALS) IMPLANT
RELOAD STAPLE TA45 3.5 REG BLU (ENDOMECHANICALS) ×3 IMPLANT
SCISSORS LAP 5X35 DISP (ENDOMECHANICALS) ×3 IMPLANT
SET IRRIG TUBING LAPAROSCOPIC (IRRIGATION / IRRIGATOR) ×3 IMPLANT
SHEARS HARMONIC ACE PLUS 36CM (ENDOMECHANICALS) ×3 IMPLANT
SLEEVE XCEL OPT CAN 5 100 (ENDOMECHANICALS) ×3 IMPLANT
SUT MNCRL AB 4-0 PS2 18 (SUTURE) ×3 IMPLANT
TOWEL OR 17X26 10 PK STRL BLUE (TOWEL DISPOSABLE) ×3 IMPLANT
TRAY FOLEY W/METER SILVER 16FR (SET/KITS/TRAYS/PACK) ×3 IMPLANT
TRAY LAPAROSCOPIC (CUSTOM PROCEDURE TRAY) ×3 IMPLANT
TROCAR BLADELESS OPT 5 100 (ENDOMECHANICALS) ×3 IMPLANT
TROCAR XCEL BLUNT TIP 100MML (ENDOMECHANICALS) ×3 IMPLANT
TUBING INSUF HEATED (TUBING) ×3 IMPLANT

## 2017-08-16 NOTE — Anesthesia Preprocedure Evaluation (Addendum)
Anesthesia Evaluation  Patient identified by MRN, date of birth, ID band Patient awake    Reviewed: Allergy & Precautions, NPO status , Patient's Chart, lab work & pertinent test results  History of Anesthesia Complications (+) PONV  Airway Mallampati: II  TM Distance: >3 FB Neck ROM: Full    Dental  (+) Chipped, Caps, Dental Advisory Given   Pulmonary neg recent URI, former smoker (quit 2017),    breath sounds clear to auscultation       Cardiovascular negative cardio ROS   Rhythm:Regular Rate:Normal     Neuro/Psych Anxiety negative neurological ROS     GI/Hepatic negative GI ROS, Neg liver ROS,   Endo/Other  Morbid obesityPolycystic ovarian  Renal/GU negative Renal ROS     Musculoskeletal   Abdominal (+) + obese,   Peds  Hematology negative hematology ROS (+)   Anesthesia Other Findings   Reproductive/Obstetrics LMP presently                            Anesthesia Physical Anesthesia Plan  ASA: II and emergent  Anesthesia Plan: General   Post-op Pain Management:    Induction: Intravenous  PONV Risk Score and Plan: 4 or greater and Ondansetron, Dexamethasone, Midazolam and Scopolamine patch - Pre-op  Airway Management Planned: Oral ETT  Additional Equipment:   Intra-op Plan:   Post-operative Plan: Extubation in OR  Informed Consent: I have reviewed the patients History and Physical, chart, labs and discussed the procedure including the risks, benefits and alternatives for the proposed anesthesia with the patient or authorized representative who has indicated his/her understanding and acceptance.   Dental advisory given  Plan Discussed with: CRNA and Surgeon  Anesthesia Plan Comments: (Plan routine monitors, GETA)       Anesthesia Quick Evaluation

## 2017-08-16 NOTE — Anesthesia Postprocedure Evaluation (Signed)
Anesthesia Post Note  Patient: Alexandra PondsHeather M Estes  Procedure(s) Performed: APPENDECTOMY LAPAROSCOPIC (N/A Abdomen)     Patient location during evaluation: PACU Anesthesia Type: General Level of consciousness: sedated, patient cooperative and oriented Pain management: pain level controlled Vital Signs Assessment: post-procedure vital signs reviewed and stable Respiratory status: spontaneous breathing, nonlabored ventilation and respiratory function stable Cardiovascular status: blood pressure returned to baseline and stable Postop Assessment: no apparent nausea or vomiting Anesthetic complications: no    Last Vitals:  Vitals:   08/16/17 1615 08/16/17 1630  BP: 106/60 103/66  Pulse: 66 68  Resp: 14 14  Temp:  36.6 C  SpO2: 98% 96%    Last Pain:  Vitals:   08/16/17 1630  TempSrc:   PainSc: Asleep                 Melodee Lupe,E. Quentyn Kolbeck

## 2017-08-16 NOTE — MAU Note (Signed)
Patient presents with onset of abdominal pain more on the right side onset 3 days, on period much heavier than normal with clots.  Received orders.

## 2017-08-16 NOTE — MAU Note (Signed)
Patient transferred via [facility]. 

## 2017-08-16 NOTE — Anesthesia Procedure Notes (Signed)
Date/Time: 08/16/2017 3:33 PM Performed by: Minerva EndsMirarchi, Eisen Robenson M, CRNA Pre-anesthesia Checklist: Emergency Drugs available, Suction available, Patient being monitored and Patient identified Oxygen Delivery Method: Simple face mask Placement Confirmation: positive ETCO2 and breath sounds checked- equal and bilateral Dental Injury: Teeth and Oropharynx as per pre-operative assessment  Comments: Extubated to face mask--- good AW

## 2017-08-16 NOTE — MAU Note (Addendum)
Pain on both sides of lower abd for 3 days. Worse on R side. Started period 3 days ago and heavier than usual with clotting. Pain started a day before period started. Had surgery in spring and was told had scar tissue

## 2017-08-16 NOTE — H&P (Signed)
[name] is an 31 y.o. female.     Chief Complaint: Abdominal pain  HPI: Generally healthy 31 year old female 3 days ago developed the gradual onset of right lower quadrant abdominal pain.  This initially was intermittent and not very severe.  She has a history of pelvic pain secondary to ovarian cysts and scarring so she did not think too much of it at first.  However over the last 24-48 hours the pain has gotten more constant and progressively worse.  Localized  in the right lower quadrant with some radiation to the left lower quadrant.  This has been associated with lack of appetite and some nausea but no vomiting.  No GU symptoms.  Bowel movements normal.  She presented to [facility] where a CT scan was obtained showing evidence of appendicitis as below.  Past Medical History:  Diagnosis Date  . Allergy    seasonal  . Anxiety   . Aspergillosis (HCC)    sinus  . Chronic idiopathic urticaria   . Complication of anesthesia    prolonged sedation, could not breath after tube removal from sinus surgery, required breathing treatment and was admitted for overnight observation  . Dermatographism   . Duodenitis   . GERD (gastroesophageal reflux disease)   . Herpes   . PCOS (polycystic ovarian syndrome)   . PONV (postoperative nausea and vomiting)    vomiting after tonsillectomy, 2008    Past Surgical History:  Procedure Laterality Date  . CERVICAL CERCLAGE    . ESOPHAGOGASTRODUODENOSCOPY    . KNEE SURGERY Right 2007  . LAPAROSCOPIC LYSIS OF ADHESIONS N/A 10/31/2016   Procedure: LAPAROSCOPIC LYSIS OF ADHESIONS;  Surgeon: Hoover Browns, MD;  Location: [facility];  Service: Gynecology;  Laterality: N/A;  . LAPAROSCOPY N/A 10/31/2016   Procedure: LAPAROSCOPY DIAGNOSTIC;  Surgeon: Hoover Browns, MD;  Location: [facility];  Service: Gynecology;  Laterality: N/A;  . NASAL SINUS SURGERY    . TONSILLECTOMY  2008  . WISDOM TOOTH EXTRACTION      Family History  Problem Relation Age of Onset  .  Allergies Sister   . Allergies Mother   . Asthma Mother   . Allergies Father    Social History:  reports that she quit smoking about 14 months ago. Her smoking use included cigarettes. She has a 1.00 pack-year smoking history. she has never used smokeless tobacco. She reports that she does not drink alcohol or use drugs.  Allergies:  Allergies  Allergen Reactions  . Penicillins Anaphylaxis    ++++ABLE TO TAKE ROCEPHIN++++ Has patient had a PCN reaction causing immediate rash, facial/tongue/throat swelling, SOB or lightheadedness with hypotension: yes Has patient had a PCN reaction causing severe rash involving mucus membranes or skin necrosis: unknown Has patient had a PCN reaction that required hospitalization no Has patient had a PCN reaction occurring within the last 10 years: yes If all of the above answers are "NO", then may proceed with Cephalosporin use.   . Shrimp [Shellfish Allergy] Anaphylaxis  . Eggs Or Egg-Derived Products Nausea Only    Yolk  . Soy Allergy Nausea Only  . Wheat Bran Other (See Comments)    inflammation    Medications Prior to Admission  Medication Sig Dispense Refill  . ALPRAZolam (XANAX) 0.25 MG tablet Take 0.25 mg by mouth 2 (two) times daily as needed for anxiety.    . citalopram (CELEXA) 20 MG tablet Take 30 mg by mouth every evening. Takes 1.5 tablet    . doxazosin (CARDURA) 2  MG tablet Take 2 mg by mouth at bedtime.     Marland Kitchen EPINEPHrine (EPIPEN IJ) Inject 1 each as directed once.    . hydrOXYzine (ATARAX/VISTARIL) 25 MG tablet Take 25-50 mg by mouth at bedtime.    Marland Kitchen ibuprofen (ADVIL,MOTRIN) 800 MG tablet Take 1 tablet (800 mg total) by mouth every 8 (eight) hours as needed for moderate pain or cramping. 30 tablet 0  . metFORMIN (GLUCOPHAGE) 500 MG tablet Take 500 mg by mouth daily.  1  . ondansetron (ZOFRAN ODT) 8 MG disintegrating tablet Take 1 tablet (8 mg total) by mouth every 8 (eight) hours as needed for nausea or vomiting. 20 tablet 0  .  PARAGARD INTRAUTERINE COPPER IUD IUD 1 each by Intrauterine route once.      Results for orders placed or performed during the hospital encounter of 08/16/17 (from the past 48 hour(s))  Urinalysis, Routine w reflex microscopic     Status: Abnormal   Collection Time: 08/16/17  6:52 AM  Result Value Ref Range   Color, Urine YELLOW YELLOW   APPearance CLEAR CLEAR   Specific Gravity, Urine 1.031 (H) 1.005 - 1.030   pH 5.0 5.0 - 8.0   Glucose, UA NEGATIVE NEGATIVE mg/dL   Hgb urine dipstick MODERATE (A) NEGATIVE   Bilirubin Urine NEGATIVE NEGATIVE   Ketones, ur NEGATIVE NEGATIVE mg/dL   Protein, ur NEGATIVE NEGATIVE mg/dL   Nitrite NEGATIVE NEGATIVE   Leukocytes, UA NEGATIVE NEGATIVE   RBC / HPF 6-30 0 - 5 RBC/hpf   WBC, UA 0-5 0 - 5 WBC/hpf   Bacteria, UA NONE SEEN NONE SEEN   Squamous Epithelial / LPF 0-5 (A) NONE SEEN   Mucus PRESENT   Pregnancy, urine POC     Status: None   Collection Time: 08/16/17  7:05 AM  Result Value Ref Range   Preg Test, Ur NEGATIVE NEGATIVE    Comment:        THE SENSITIVITY OF THIS METHODOLOGY IS >24 mIU/mL   CBC     Status: Abnormal   Collection Time: 08/16/17  7:26 AM  Result Value Ref Range   WBC 13.7 (H) 4.0 - 10.5 K/uL   RBC 4.55 3.87 - 5.11 MIL/uL   Hemoglobin 13.7 12.0 - 15.0 g/dL   HCT 84.1 32.4 - 40.1 %   MCV 91.4 78.0 - 100.0 fL   MCH 30.1 26.0 - 34.0 pg   MCHC 32.9 30.0 - 36.0 g/dL   RDW 02.7 25.3 - 66.4 %   Platelets 257 150 - 400 K/uL  Wet prep, genital     Status: None   Collection Time: 08/16/17  9:44 AM  Result Value Ref Range   Yeast Wet Prep HPF POC NONE SEEN NONE SEEN   Trich, Wet Prep NONE SEEN NONE SEEN   Clue Cells Wet Prep HPF POC NONE SEEN NONE SEEN   WBC, Wet Prep HPF POC NONE SEEN NONE SEEN    Comment: FEW BACTERIA SEEN   Sperm NONE SEEN    Ct Abdomen Pelvis W Contrast  Result Date: 08/16/2017 CLINICAL DATA:  31 year old female with a history of lower abdominal pain for 3 days EXAM: CT ABDOMEN AND PELVIS WITH  CONTRAST TECHNIQUE: Multidetector CT imaging of the abdomen and pelvis was performed using the standard protocol following bolus administration of intravenous contrast. CONTRAST:  ISOVUE-300 IOPAMIDOL (ISOVUE-300) INJECTION 61% COMPARISON:  09/09/2016, 06/27/2016 FINDINGS: Lower chest: No acute abnormality. Hepatobiliary: Unremarkable liver.  Unremarkable gallbladder. Pancreas: Unremarkable pancreas Spleen: Unremarkable spleen  Adrenals/Urinary Tract: Unremarkable bilateral adrenal glands. Unremarkable bilateral kidneys. Unremarkable course the bilateral ureters. Unremarkable appearance of the urinary bladder. Stomach/Bowel: Unremarkable stomach. Unremarkable small bowel with no focal wall thickening. Mild diverticular change without evidence of acute diverticulitis. Based appendix unremarkable, however, the tip appendix is thickened and indistinct, located medially from the cecum overlying the right adnexa and inseparable from the dome of the uterus. Location similar to the prior CT of 1 year ago, with new inflammatory changes. Associated inflammatory changes within the adjacent small bowel loops, with trace fluid and edema. Appendix: Location: Medial to the cecum adjacent to uterusDiameter: 12 mmAppendicolith: NoMucosal hyper-enhancement: YesExtraluminal gas: NoPeriappendiceal collection: No Vascular/Lymphatic: No significant vascular disease. No aneurysm or dissection. Iliac and proximal femoral vasculature patent. Reproductive: Small amount of fluid within the endometrial canal, unchanged from prior. Intrauterine device. Physiologic changes of the bilateral at adnexa. Other: Small fat containing umbilical hernia. Musculoskeletal: No acute fracture. IMPRESSION: CT demonstrates a tip appendicitis, without abscess or perforation. Differential would include a mucocele, although the appendix was normal on prior CT. Note that the appendix is medial from the cecum, in the midline, inseparable from the right adnexa  and the dome of the uterus. These results were called by telephone at the time of interpretation on 08/16/2017 at 11:03 am to the nurse caring for the patient, Ms Laurell JosephsCheryl Anderson who verbally acknowledged these results. Intrauterine device. Physiologic changes of the bilateral at adnexa. Electronically Signed   By: Gilmer MorJaime  Wagner D.O.   On: 08/16/2017 11:07   [place] Pelvic Complete W Transvaginal And Torsion R/o  Result Date: 08/16/2017 CLINICAL DATA:  Acute lower abdominal pain. EXAM: TRANSABDOMINAL AND TRANSVAGINAL ULTRASOUND OF PELVIS DOPPLER ULTRASOUND OF OVARIES TECHNIQUE: Both transabdominal and transvaginal ultrasound examinations of the pelvis were performed. Transabdominal technique was performed for global imaging of the pelvis including uterus, ovaries, adnexal regions, and pelvic cul-de-sac. It was necessary to proceed with endovaginal exam following the transabdominal exam to visualize the ovaries. Color and duplex Doppler ultrasound was utilized to evaluate blood flow to the ovaries. COMPARISON:  Ultrasound of September 09, 2016. CT scan of September 09, 2016. FINDINGS: Uterus Measurements: 8.5 x 5.6 x 5.1 cm. No fibroids or other mass visualized. Endometrium Thickness: 6 mm.  Intrauterine device is noted. Right ovary Measurements: 4.4 x 2.9 x 2.5 cm. Normal appearance/no adnexal mass. Left ovary Measurements: 3.3 x 2.6 x 2.3 cm. 4.1 x 3.3 x 2.8 cm soft tissue mass is seen adjacent to left ovary which demonstrates flow on Doppler. Pulsed Doppler evaluation of both ovaries demonstrates normal low-resistance arterial and venous waveforms. Other findings Small amount of free fluid is noted which most likely is physiologic. IMPRESSION: Intrauterine device is noted. 4.1 x 3.3 x 2.8 cm soft tissue mass is seen adjacent to left ovary which demonstrates flow on Doppler. This is concerning for possible neoplasm. Further evaluation with MRI scan with contrast administration is recommended. Electronically Signed    By: Lupita RaiderJames  Green Jr, M.D.   On: 08/16/2017 09:24    Review of Systems  Constitutional: Negative for chills, fever and weight loss.  Respiratory: Negative.   Cardiovascular: Negative.   Gastrointestinal: Positive for abdominal pain and nausea. Negative for constipation, diarrhea and vomiting.  Genitourinary: Negative.     Last menstrual period 08/13/2017. Physical Exam  General: Alert, moderately obese young Caucasian female, in no distress Skin: Warm and dry without rash or infection. HEENT: No palpable masses or thyromegaly. Sclera nonicteric. Pupils equal round and reactive. Oropharynx clear. Lymph  nodes: No cervical, supraclavicular, or inguinal nodes palpable. Lungs: Breath sounds clear and equal without increased work of breathing Cardiovascular: Regular rate and rhythm without murmur. No edema. Abdomen: Nondistended.  There is localized right lower quadrant tenderness with some guarding but no definite peritoneal signs.  No masses palpable. No organomegaly. No palpable hernias.  Well-healed infraumbilical laparoscopic incision. Extremities: No edema or joint swelling or deformity. No chronic venous stasis changes. Neurologic: Alert and fully oriented.  Affect normal.  No motor deficits.  Assessment/Plan 48-72 hours of right lower quadrant abdominal pain associated with nausea, mildly elevated WBC, tenderness in the right lower quadrant and CT scan all consistent with uncomplicated acute appendicitis.  I discussed options of surgical and antibiotic management.  We discussed the nature of surgery and recovery, indications, risks of anesthesia or medication reactions, bleeding, infection, visceral injury or possible need for open surgery.  All questions answered and she desires to proceed.  Mariella SaaBenjamin T Abbie Berling, MD 08/16/2017, 1:33 PM

## 2017-08-16 NOTE — Op Note (Signed)
Preoperative Diagnosis: Acute appendicitis with localized peritonitis  Postoprative Diagnosis: Same  Procedure: Procedure(s): APPENDECTOMY LAPAROSCOPIC   Surgeon: Glenna FellowsHoxworth, Tabbetha Kutscher T   Assistants: None  Anesthesia:  General endotracheal anesthesia  Indications: Patient is a 31 year old female who presents with 3 days of progressive right lower quadrant abdominal pain and anorexia.  She has localized tenderness, elevated white blood count and CT scan indicating acute appendicitis adherent to the right tube and ovary.  After preoperative discussion detailed elsewhere we have elected to proceed with laparoscopic appendectomy.    Procedure Detail: Patient was brought to the operating room, placed in the supine position on the operating table, and general endotracheal anesthesia induced.  Foley catheter was placed.  She had received preoperative IV antibiotics.  The abdomen was widely sterilely prepped and draped.  Patient timeout was performed and correct procedure verified.  Access was obtained with a 1.5 cm incision beneath the umbilicus with an open Hassan technique through mattress suture of 0 Vicryl and pneumoperitoneum was established.  Under direct vision a 5 mm trocar was placed in the epigastrium and in the left lower quadrant.  The appendix was visualized which was very long and adherent down to the right tube and ovary and anterior portion of the sigmoid colon.  The base was uninflamed.  As I dissected distally there was acute inflammation and also what appeared to be some chronic adhesions to the tube and ovary.  Using careful blunt dissection the appendix was in its entirety separated from the sigmoid colon and tube and ovary and elevated into the abdomen.  It was very long and acutely inflamed along its distal aspect with exudate but no perforation or gangrene.  The appendix was mobilized dividing lateral peritoneal attachments.  The mesoappendix was then sequentially divided with the  harmonic scalpel until the appendix was completely freed out of the tip of the cecum.  It was divided with a single firing of the Endo GIA 45 mm blue load stapler across the tip of the cecum.  The staple line was intact and without bleeding.  The appendix was placed in an Endo Catch bag and brought out through the umbilical incision.  The abdomen was then thoroughly irrigated.  There was some cloudy fluid in the pelvis that was completely irrigated and suctioned.  There was still some inflammatory adhesions around the tube and ovary to the sigmoid colon and these were carefully taken down and the tube and ovary completely freed.  The abdomen was then further irrigated until clear.  There was no bleeding or evidence of injury or other problems.  The trochars were removed and mattress suture secured to the umbilicus and all CO2 evacuated.  Skin incisions were closed with subcuticular 4-0 Monocryl and Dermabond.  Sponge needle and instrument counts were correct.   Findings: Acute appendicitis without perforation or gangrene  Estimated Blood Loss:  Minimal         Drains: None  Blood Given: none          Specimens: Appendix        Complications:  * No complications entered in OR log *         Disposition: PACU - hemodynamically stable.         Condition: stable

## 2017-08-16 NOTE — Anesthesia Procedure Notes (Signed)
Procedure Name: Intubation Date/Time: 08/16/2017 2:21 PM Performed by: Minerva EndsMirarchi, Beauregard Jarrells M, CRNA Pre-anesthesia Checklist: Patient identified, Emergency Drugs available, Suction available and Patient being monitored Patient Re-evaluated:Patient Re-evaluated prior to induction Oxygen Delivery Method: Circle System Utilized Preoxygenation: Pre-oxygenation with 100% oxygen Induction Type: IV induction Ventilation: Mask ventilation without difficulty Laryngoscope Size: Miller and 2 Grade View: Grade I Tube type: Oral Tube size: 7.0 mm Number of attempts: 1 Airway Equipment and Method: Stylet Placement Confirmation: ETT inserted through vocal cords under direct vision,  positive ETCO2 and breath sounds checked- equal and bilateral Secured at: 21 cm Tube secured with: Tape Dental Injury: Teeth and Oropharynx as per pre-operative assessment  Comments: Smooth IV induction Jean RosenthalJackson-- intubation AM CRNA atraumatic--- teeth and mouth as preop--- chipped front tooth present prior to laryngoscopy --- bilat BS Jean RosenthalJackson

## 2017-08-16 NOTE — MAU Note (Signed)
Called Bed Placement for bed assignment, no answer had to leave voice message.

## 2017-08-16 NOTE — MAU Provider Note (Signed)
History     CSN: [id number]  Arrival date and time: 08/16/17 1191   None     Chief Complaint  Patient presents with  . Abdominal Pain   [name] 31 y.o. [id number] presents with increasing right lower abdominal pain tht is described as an 8. Denies n/v.     Pertinent Gynecological History: Menses: flow is moderate Bleeding: currently on menses Contraception: IUD DES exposure:  Blood transfusions:  Sexually transmitted diseases: no past history Previous GYN Procedures:   Last mammogram:  Date:  Last pap:  Date:    Past Medical History:  Diagnosis Date  . Allergy    seasonal  . Anxiety   . Aspergillosis (HCC)    sinus  . Chronic idiopathic urticaria   . Complication of anesthesia    prolonged sedation, could not breath after tube removal from sinus surgery, required breathing treatment and was admitted for overnight observation  . Dermatographism   . Duodenitis   . GERD (gastroesophageal reflux disease)   . Herpes   . PCOS (polycystic ovarian syndrome)   . PONV (postoperative nausea and vomiting)    vomiting after tonsillectomy, 2008    Past Surgical History:  Procedure Laterality Date  . CERVICAL CERCLAGE    . ESOPHAGOGASTRODUODENOSCOPY    . KNEE SURGERY Right 2007  . LAPAROSCOPIC LYSIS OF ADHESIONS N/A 10/31/2016   Procedure: LAPAROSCOPIC LYSIS OF ADHESIONS;  Surgeon: Hoover Browns, MD;  Location: [facility];  Service: Gynecology;  Laterality: N/A;  . LAPAROSCOPY N/A 10/31/2016   Procedure: LAPAROSCOPY DIAGNOSTIC;  Surgeon: Hoover Browns, MD;  Location: [facility];  Service: Gynecology;  Laterality: N/A;  . NASAL SINUS SURGERY    . TONSILLECTOMY  2008  . WISDOM TOOTH EXTRACTION      Family History  Problem Relation Age of Onset  . Allergies Sister   . Allergies Mother   . Asthma Mother   . Allergies Father     Social History   Tobacco Use  . Smoking status: Former Smoker    Packs/day: 1.00    Years: 1.00    Pack years: 1.00    Types: Cigarettes    Last  attempt to quit: 05/24/2016    Years since quitting: 1.2  . Smokeless tobacco: Never Used  Substance Use Topics  . Alcohol use: No    Alcohol/week: 0.0 oz  . Drug use: No    Allergies:  Allergies  Allergen Reactions  . Penicillins Anaphylaxis    ++++ABLE TO TAKE ROCEPHIN++++ Has patient had a PCN reaction causing immediate rash, facial/tongue/throat swelling, SOB or lightheadedness with hypotension: yes Has patient had a PCN reaction causing severe rash involving mucus membranes or skin necrosis: unknown Has patient had a PCN reaction that required hospitalization no Has patient had a PCN reaction occurring within the last 10 years: yes If all of the above answers are "NO", then may proceed with Cephalosporin use.   . Shrimp [Shellfish Allergy] Anaphylaxis  . Eggs Or Egg-Derived Products Nausea Only    Yolk  . Soy Allergy Nausea Only  . Wheat Bran Other (See Comments)    inflammation    Medications Prior to Admission  Medication Sig Dispense Refill Last Dose  . ALPRAZolam (XANAX) 0.25 MG tablet Take 0.25 mg by mouth 2 (two) times daily as needed for anxiety.   Past Month at Unknown time  . citalopram (CELEXA) 20 MG tablet Take 30 mg by mouth every evening. Takes 1.5 tablet   Past Week at Unknown time  .  doxazosin (CARDURA) 2 MG tablet Take 2 mg by mouth daily.    Past Week at Unknown time  . EPINEPHrine (EPIPEN IJ) Inject 1 each as directed once.   Emergency  . hydrOXYzine (ATARAX/VISTARIL) 25 MG tablet Take 25-50 mg by mouth at bedtime.   Past Week at Unknown time  . ibuprofen (ADVIL,MOTRIN) 800 MG tablet Take 1 tablet (800 mg total) by mouth every 8 (eight) hours as needed for moderate pain or cramping. 30 tablet 0 08/15/2017 at Unknown time  . metFORMIN (GLUCOPHAGE) 500 MG tablet Take 500 mg by mouth daily.  1 08/15/2017 at Unknown time  . ondansetron (ZOFRAN ODT) 8 MG disintegrating tablet Take 1 tablet (8 mg total) by mouth every 8 (eight) hours as needed for nausea or  vomiting. 20 tablet 0 Past Month at Unknown time  . PARAGARD INTRAUTERINE COPPER IUD IUD 1 each by Intrauterine route once.   Continuous    Review of Systems  Gastrointestinal: Positive for abdominal pain.  All other systems reviewed and are negative.  Physical Exam   Blood pressure 117/85, pulse 94, temperature 99 F (37.2 C), resp. rate 18, last menstrual period 08/13/2017.  Physical Exam  Nursing note and vitals reviewed. Constitutional: She is oriented to person, place, and time. She appears well-developed and well-nourished.  HENT:  Head: Normocephalic and atraumatic.  Cardiovascular: Normal rate.  Respiratory: Effort normal and breath sounds normal.  GI: There is tenderness. There is guarding.  Lower right  Genitourinary: Vagina normal.  Musculoskeletal: Normal range of motion.  Neurological: She is alert and oriented to person, place, and time.  Skin: Skin is warm and dry.  Psychiatric: She has a normal mood and affect. Her behavior is normal. Judgment and thought content normal.  Ct Abdomen Pelvis W Contrast  Result Date: 08/16/2017 CLINICAL DATA:  31 year old female with a history of lower abdominal pain for 3 days EXAM: CT ABDOMEN AND PELVIS WITH CONTRAST TECHNIQUE: Multidetector CT imaging of the abdomen and pelvis was performed using the standard protocol following bolus administration of intravenous contrast. CONTRAST:  100mL ISOVUE-300 IOPAMIDOL (ISOVUE-300) INJECTION 61% COMPARISON:  09/09/2016, 06/27/2016 FINDINGS: Lower chest: No acute abnormality. Hepatobiliary: Unremarkable liver.  Unremarkable gallbladder. Pancreas: Unremarkable pancreas Spleen: Unremarkable spleen Adrenals/Urinary Tract: Unremarkable bilateral adrenal glands. Unremarkable bilateral kidneys. Unremarkable course the bilateral ureters. Unremarkable appearance of the urinary bladder. Stomach/Bowel: Unremarkable stomach. Unremarkable small bowel with no focal wall thickening. Mild diverticular change  without evidence of acute diverticulitis. Based appendix unremarkable, however, the tip appendix is thickened and indistinct, located medially from the cecum overlying the right adnexa and inseparable from the dome of the uterus. Location similar to the prior CT of 1 year ago, with new inflammatory changes. Associated inflammatory changes within the adjacent small bowel loops, with trace fluid and edema. Appendix: Location: Medial to the cecum adjacent to uterusDiameter: 12 mmAppendicolith: NoMucosal hyper-enhancement: YesExtraluminal gas: NoPeriappendiceal collection: No Vascular/Lymphatic: No significant vascular disease. No aneurysm or dissection. Iliac and proximal femoral vasculature patent. Reproductive: Small amount of fluid within the endometrial canal, unchanged from prior. Intrauterine device. Physiologic changes of the bilateral at adnexa. Other: Small fat containing umbilical hernia. Musculoskeletal: No acute fracture. IMPRESSION: CT demonstrates a tip appendicitis, without abscess or perforation. Differential would include a mucocele, although the appendix was normal on prior CT. Note that the appendix is medial from the cecum, in the midline, inseparable from the right adnexa and the dome of the uterus. These results were called by telephone at the time of interpretation  on 08/16/2017 at 11:03 am to the nurse caring for the patient, Ms Laurell JosephsCheryl Anderson who verbally acknowledged these results. Intrauterine device. Physiologic changes of the bilateral at adnexa. Electronically Signed   By: Gilmer MorJaime  Wagner D.O.   On: 08/16/2017 11:07   [place] Pelvic Complete W Transvaginal And Torsion R/o  Result Date: 08/16/2017 CLINICAL DATA:  Acute lower abdominal pain. EXAM: TRANSABDOMINAL AND TRANSVAGINAL ULTRASOUND OF PELVIS DOPPLER ULTRASOUND OF OVARIES TECHNIQUE: Both transabdominal and transvaginal ultrasound examinations of the pelvis were performed. Transabdominal technique was performed for global imaging of the  pelvis including uterus, ovaries, adnexal regions, and pelvic cul-de-sac. It was necessary to proceed with endovaginal exam following the transabdominal exam to visualize the ovaries. Color and duplex Doppler ultrasound was utilized to evaluate blood flow to the ovaries. COMPARISON:  Ultrasound of September 09, 2016. CT scan of September 09, 2016. FINDINGS: Uterus Measurements: 8.5 x 5.6 x 5.1 cm. No fibroids or other mass visualized. Endometrium Thickness: 6 mm.  Intrauterine device is noted. Right ovary Measurements: 4.4 x 2.9 x 2.5 cm. Normal appearance/no adnexal mass. Left ovary Measurements: 3.3 x 2.6 x 2.3 cm. 4.1 x 3.3 x 2.8 cm soft tissue mass is seen adjacent to left ovary which demonstrates flow on Doppler. Pulsed Doppler evaluation of both ovaries demonstrates normal low-resistance arterial and venous waveforms. Other findings Small amount of free fluid is noted which most likely is physiologic. IMPRESSION: Intrauterine device is noted. 4.1 x 3.3 x 2.8 cm soft tissue mass is seen adjacent to left ovary which demonstrates flow on Doppler. This is concerning for possible neoplasm. Further evaluation with MRI scan with contrast administration is recommended. Electronically Signed   By: Lupita RaiderJames  Green Jr, M.D.   On: 08/16/2017 09:24   CT with contrast pending- Radiology called nurses station to inform that pt had appendicitis Results for orders placed or performed during the hospital encounter of 08/16/17 (from the past 24 hour(s))  Urinalysis, Routine w reflex microscopic     Status: Abnormal   Collection Time: 08/16/17  6:52 AM  Result Value Ref Range   Color, Urine YELLOW YELLOW   APPearance CLEAR CLEAR   Specific Gravity, Urine 1.031 (H) 1.005 - 1.030   pH 5.0 5.0 - 8.0   Glucose, UA NEGATIVE NEGATIVE mg/dL   Hgb urine dipstick MODERATE (A) NEGATIVE   Bilirubin Urine NEGATIVE NEGATIVE   Ketones, ur NEGATIVE NEGATIVE mg/dL   Protein, ur NEGATIVE NEGATIVE mg/dL   Nitrite NEGATIVE NEGATIVE    Leukocytes, UA NEGATIVE NEGATIVE   RBC / HPF 6-30 0 - 5 RBC/hpf   WBC, UA 0-5 0 - 5 WBC/hpf   Bacteria, UA NONE SEEN NONE SEEN   Squamous Epithelial / LPF 0-5 (A) NONE SEEN   Mucus PRESENT   Pregnancy, urine POC     Status: None   Collection Time: 08/16/17  7:05 AM  Result Value Ref Range   Preg Test, Ur NEGATIVE NEGATIVE  CBC     Status: Abnormal   Collection Time: 08/16/17  7:26 AM  Result Value Ref Range   WBC 13.7 (H) 4.0 - 10.5 K/uL   RBC 4.55 3.87 - 5.11 MIL/uL   Hemoglobin 13.7 12.0 - 15.0 g/dL   HCT 16.141.6 09.636.0 - 04.546.0 %   MCV 91.4 78.0 - 100.0 fL   MCH 30.1 26.0 - 34.0 pg   MCHC 32.9 30.0 - 36.0 g/dL   RDW 40.912.3 81.111.5 - 91.415.5 %   Platelets 257 150 - 400 K/uL  Wet prep,  genital     Status: None   Collection Time: 08/16/17  9:44 AM  Result Value Ref Range   Yeast Wet Prep HPF POC NONE SEEN NONE SEEN   Trich, Wet Prep NONE SEEN NONE SEEN   Clue Cells Wet Prep HPF POC NONE SEEN NONE SEEN   WBC, Wet Prep HPF POC NONE SEEN NONE SEEN   Sperm NONE SEEN    MAU Course  Procedures  MDM  Pt was given pain medicine and u/s and CT were ordered to evaluate pain. Pelvic exam completed and cultures obtained. GC and [place] to be obtained by urine sample.After reviewing all results, it was determined that pt has acute appendicitis. Dr Estanislado Pandy aware of pt transfer to [facility]. Dr Wilfrid Lund is admitting MD on Med surg floor. Pt verbalizes understanding of condition Assessment and Plan  Appendicitis  Transfer to [facility] Pt accepted by Dr. Birdie Sons A Pauletta Pickney CNM 08/16/2017, 11:25 AM

## 2017-08-16 NOTE — Transfer of Care (Signed)
Immediate Anesthesia Transfer of Care Note  Patient: [name]  Procedure(s) Performed: APPENDECTOMY LAPAROSCOPIC (N/A Abdomen)  Patient Location: PACU  Anesthesia Type:General  Level of Consciousness: sedated  Airway & Oxygen Therapy: Patient Spontanous Breathing and Patient connected to face mask oxygen  Post-op Assessment: Report given to RN and Post -op Vital signs reviewed and stable  Post vital signs: Reviewed and stable  Last Vitals:  Vitals:   08/16/17 1330  BP: (!) 96/59  Pulse: 67  Temp: 36.7 C  SpO2: 97%    Last Pain:  Vitals:   08/16/17 1330  TempSrc: Oral  PainSc: 7          Complications: No apparent anesthesia complications

## 2017-08-17 ENCOUNTER — Other Ambulatory Visit: Payer: Self-pay

## 2017-08-17 ENCOUNTER — Encounter (HOSPITAL_COMMUNITY): Payer: Self-pay | Admitting: General Surgery

## 2017-08-17 MED ORDER — HYDROMORPHONE HCL 2 MG PO TABS
4.0000 mg | ORAL_TABLET | ORAL | Status: DC | PRN
  Administered 2017-08-17 (×2): 4 mg via ORAL
  Filled 2017-08-17 (×2): qty 2

## 2017-08-17 MED ORDER — HYDROMORPHONE HCL 4 MG PO TABS: 4.0000 mg | ORAL_TABLET | Freq: Four times a day (QID) | ORAL | 0 refills | Status: DC | PRN

## 2017-08-17 NOTE — Progress Notes (Signed)
Assessment unchanged. Pain managed with po analgesics. Pt and mother verbalized understanding of dc instructions through teach back including follow up care, when to call the doctor if problems arise, as well as activity to resume. Script x 1 given as provided by MD. Discharged via wc to front entrance accompanied by mother and NT.

## 2017-08-17 NOTE — Discharge Instructions (Signed)
[facility] ______[facility] LAPAROSCOPIC SURGERY: POST OP INSTRUCTIONS Always review your discharge instruction sheet given to you by the facility where your surgery was performed. IF YOU HAVE DISABILITY OR FAMILY LEAVE FORMS, YOU MUST BRING THEM TO THE OFFICE FOR PROCESSING.   DO NOT GIVE THEM TO YOUR DOCTOR.  1. A prescription for pain medication may be given to you upon discharge.  Take your pain medication as prescribed, if needed.  If narcotic pain medicine is not needed, then you may take acetaminophen (Tylenol) or ibuprofen (Advil) as needed. 2. Take your usually prescribed medications unless otherwise directed. 3. If you need a refill on your pain medication, please contact your pharmacy.  They will contact our office to request authorization. Prescriptions will not be filled after 5pm or on week-ends. 4. You should follow a light diet the first few days after arrival home, such as soup and crackers, etc.  Be sure to include lots of fluids daily. 5. Most patients will experience some swelling and bruising in the area of the incisions.  Ice packs will help.  Swelling and bruising can take several days to resolve.  6. It is common to experience some constipation if taking pain medication after surgery.  Increasing fluid intake and taking a stool softener (such as Colace) will usually help or prevent this problem from occurring.  A mild laxative (Milk of Magnesia or Miralax) should be taken according to package instructions if there are no bowel movements after 48 hours. 7. Unless discharge instructions indicate otherwise, you may remove your bandages 24-48 hours after surgery, and you may shower at that time.  You may have steri-strips (small skin tapes) in place directly over the incision.  These strips should be left on the skin for 7-10 days.  If your surgeon used skin glue on the incision, you may shower in 24 hours.  The glue will flake off over the next 2-3 weeks.  Any sutures or  staples will be removed at the office during your follow-up visit. 8. ACTIVITIES:  You may resume regular (light) daily activities beginning the next day--such as daily self-care, walking, climbing stairs--gradually increasing activities as tolerated.  You may have sexual intercourse when it is comfortable.  Refrain from any heavy lifting or straining until approved by your doctor. a. You may drive when you are no longer taking prescription pain medication, you can comfortably wear a seatbelt, and you can safely maneuver your car and apply brakes. b. RETURN TO WORK:  ___December 10, 2018___ 9. You should see your doctor in the office for a follow-up appointment approximately 2-3 weeks after your surgery.  Make sure that you call for this appointment within a day or two after you arrive home to insure a convenient appointment time. 10. OTHER INSTRUCTIONS: __________________________________________________________________________________________________________________________ __________________________________________________________________________________________________________________________ WHEN TO CALL YOUR DOCTOR: 1. Fever over 101.0 2. Inability to urinate 3. Continued bleeding from incision. 4. Increased pain, redness, or drainage from the incision. 5. Increasing abdominal pain  The clinic staff is available to answer your questions during regular business hours.  Please dont hesitate to call and ask to speak to one of the nurses for clinical concerns.  If you have a medical emergency, go to the nearest emergency room or call 911.  A surgeon from [facility] is always on call at the hospital. [address], [address], [place], [place]  [address] ? [address] [address], [place], [place]   [address] [phone] ? [phone] ? FAX [phone]  Web site: [email or web address]

## 2017-08-17 NOTE — Addendum Note (Signed)
Addendum  created 08/17/17 0816 by Minerva EndsMirarchi, Vandana Haman M, CRNA   Intraprocedure Meds edited

## 2017-08-17 NOTE — Progress Notes (Signed)
Patient ID: [name], female   DOB: 01/17/1986, 31 y.o.   MRN: [id number] 1 Day Post-Op   Subjective: Having pain around her umbilical incision.  Otherwise doing well.  Tolerated diet.  Preoperative pain is improved.  Getting up and down to the bathroom.  Objective: Vital signs in last 24 hours: Temp:  [97.8 F (36.6 C)-98.6 F (37 C)] 98 F (36.7 C) (11/25 0445) Pulse Rate:  [64-76] 67 (11/25 0445) Resp:  [13-20] 16 (11/25 0445) BP: (91-108)/(45-72) 105/61 (11/25 0445) SpO2:  [95 %-100 %] 96 % (11/25 0445) Weight:  [81.6 kg (179 lb 16 oz)-81.6 kg (180 lb)] 81.6 kg (179 lb 16 oz) (11/24 2100)    Intake/Output from previous day: 11/24 0701 - 11/25 0700 In: 1993.8 [P.O.:400; I.V.:1593.8] Out: 1955 [Urine:1950; Blood:5] Intake/Output this shift: No intake/output data recorded.  General appearance: alert, cooperative and no distress GI: Mild very incisional tenderness Incision/Wound: No erythema or drainage  Lab Results:  Recent Labs    08/16/17 0726  WBC 13.7*  HGB 13.7  HCT 41.6  PLT 257   BMET No results for input(s): NA, K, CL, CO2, GLUCOSE, BUN, CREATININE, CALCIUM in the last 72 hours.   Studies/Results: Ct Abdomen Pelvis W Contrast  Result Date: 08/16/2017 CLINICAL DATA:  31 year old female with a history of lower abdominal pain for 3 days EXAM: CT ABDOMEN AND PELVIS WITH CONTRAST TECHNIQUE: Multidetector CT imaging of the abdomen and pelvis was performed using the standard protocol following bolus administration of intravenous contrast. CONTRAST:  100mL ISOVUE-300 IOPAMIDOL (ISOVUE-300) INJECTION 61% COMPARISON:  09/09/2016, 06/27/2016 FINDINGS: Lower chest: No acute abnormality. Hepatobiliary: Unremarkable liver.  Unremarkable gallbladder. Pancreas: Unremarkable pancreas Spleen: Unremarkable spleen Adrenals/Urinary Tract: Unremarkable bilateral adrenal glands. Unremarkable bilateral kidneys. Unremarkable course the bilateral ureters. Unremarkable appearance  of the urinary bladder. Stomach/Bowel: Unremarkable stomach. Unremarkable small bowel with no focal wall thickening. Mild diverticular change without evidence of acute diverticulitis. Based appendix unremarkable, however, the tip appendix is thickened and indistinct, located medially from the cecum overlying the right adnexa and inseparable from the dome of the uterus. Location similar to the prior CT of 1 year ago, with new inflammatory changes. Associated inflammatory changes within the adjacent small bowel loops, with trace fluid and edema. Appendix: Location: Medial to the cecum adjacent to uterusDiameter: 12 mmAppendicolith: NoMucosal hyper-enhancement: YesExtraluminal gas: NoPeriappendiceal collection: No Vascular/Lymphatic: No significant vascular disease. No aneurysm or dissection. Iliac and proximal femoral vasculature patent. Reproductive: Small amount of fluid within the endometrial canal, unchanged from prior. Intrauterine device. Physiologic changes of the bilateral at adnexa. Other: Small fat containing umbilical hernia. Musculoskeletal: No acute fracture. IMPRESSION: CT demonstrates a tip appendicitis, without abscess or perforation. Differential would include a mucocele, although the appendix was normal on prior CT. Note that the appendix is medial from the cecum, in the midline, inseparable from the right adnexa and the dome of the uterus. These results were called by telephone at the time of interpretation on 08/16/2017 at 11:03 am to the nurse caring for the patient, Ms Laurell JosephsCheryl Anderson who verbally acknowledged these results. Intrauterine device. Physiologic changes of the bilateral at adnexa. Electronically Signed   By: Gilmer MorJaime  Wagner D.O.   On: 08/16/2017 11:07   [place] Pelvic Complete W Transvaginal And Torsion R/o  Result Date: 08/16/2017 CLINICAL DATA:  Acute lower abdominal pain. EXAM: TRANSABDOMINAL AND TRANSVAGINAL ULTRASOUND OF PELVIS DOPPLER ULTRASOUND OF OVARIES TECHNIQUE: Both  transabdominal and transvaginal ultrasound examinations of the pelvis were performed. Transabdominal technique was performed for  global imaging of the pelvis including uterus, ovaries, adnexal regions, and pelvic cul-de-sac. It was necessary to proceed with endovaginal exam following the transabdominal exam to visualize the ovaries. Color and duplex Doppler ultrasound was utilized to evaluate blood flow to the ovaries. COMPARISON:  Ultrasound of September 09, 2016. CT scan of September 09, 2016. FINDINGS: Uterus Measurements: 8.5 x 5.6 x 5.1 cm. No fibroids or other mass visualized. Endometrium Thickness: 6 mm.  Intrauterine device is noted. Right ovary Measurements: 4.4 x 2.9 x 2.5 cm. Normal appearance/no adnexal mass. Left ovary Measurements: 3.3 x 2.6 x 2.3 cm. 4.1 x 3.3 x 2.8 cm soft tissue mass is seen adjacent to left ovary which demonstrates flow on Doppler. Pulsed Doppler evaluation of both ovaries demonstrates normal low-resistance arterial and venous waveforms. Other findings Small amount of free fluid is noted which most likely is physiologic. IMPRESSION: Intrauterine device is noted. 4.1 x 3.3 x 2.8 cm soft tissue mass is seen adjacent to left ovary which demonstrates flow on Doppler. This is concerning for possible neoplasm. Further evaluation with MRI scan with contrast administration is recommended. Electronically Signed   By: Lupita RaiderJames  Green Jr, M.D.   On: 08/16/2017 09:24    Anti-infectives: Anti-infectives (From admission, onward)   Start     Dose/Rate Route Frequency Ordered Stop   08/16/17 1402  ciprofloxacin (CIPRO) 400 MG/200ML IVPB    Comments:  Juanda Crumbleanney, Michael   : cabinet override      08/16/17 1402 08/16/17 1415   08/16/17 1330  ciprofloxacin (CIPRO) IVPB 400 mg     400 mg 200 mL/hr over 60 Minutes Intravenous  Once 08/16/17 1318 08/16/17 1500   08/16/17 1330  metroNIDAZOLE (FLAGYL) IVPB 500 mg     500 mg 100 mL/hr over 60 Minutes Intravenous  Once 08/16/17 1318 08/16/17 1531       Assessment/Plan: s/p Procedure(s): APPENDECTOMY LAPAROSCOPIC Doing well postoperatively.  Incisional pain not well controlled with oxycodone.  She has used Dilaudid with success postoperatively previously.  Changed to p.o. Dilaudid and if adequate should be ready for discharge later today.    LOS: 1 day    Mariella SaaBenjamin T Hannahgrace Lalli 08/17/2017

## 2017-08-17 NOTE — Discharge Summary (Signed)
  Patient ID: [name] [id number] 31 y.o. 06/20/1986  08/16/2017  Discharge date and time: 08/17/2017   Admitting Physician: Mariella SaaBenjamin T Jalyiah Shelley  Discharge Physician: Mariella SaaBenjamin T Rayona Sardinha  Admission Diagnoses: Appenicitis  Discharge Diagnoses: Same  Operations: Procedure(s): APPENDECTOMY LAPAROSCOPIC  Admission Condition: fair  Discharged Condition: good  Indication for Admission: 31 year old female presents with 3 days of progressively worsening right lower quadrant pain.  Physical exam and history consistent with acute appendicitis which was confirmed by CT scan.  Hospital Course: Patient underwent an uneventful laparoscopic appendectomy.  She had nonperforated acute appendicitis and extensive inflammatory and chronic adhesions to the right tube and ovary.  Following morning she is having some incisional pain at the umbilical site but her preoperative pain is improved.  Tolerating regular diet and ambulatory.  Abdomen is benign.  Wounds are clean.  She is felt likely ready for discharge later today if her pain is adequately controlled.   Disposition: Home  Patient Instructions:  Allergies as of 08/17/2017      Reactions   Penicillins Anaphylaxis   ++++ABLE TO TAKE ROCEPHIN++++ Has patient had a PCN reaction causing immediate rash, facial/tongue/throat swelling, SOB or lightheadedness with hypotension: yes Has patient had a PCN reaction causing severe rash involving mucus membranes or skin necrosis: unknown Has patient had a PCN reaction that required hospitalization no Has patient had a PCN reaction occurring within the last 10 years: yes If all of the above answers are "NO", then may proceed with Cephalosporin use.   Shrimp [shellfish Allergy] Anaphylaxis   Eggs Or Egg-derived Products Nausea Only   Yolk   Soy Allergy Nausea Only   Wheat Bran Other (See Comments)   inflammation      Medication List    TAKE these medications   ALPRAZolam 0.25 MG  tablet Commonly known as:  XANAX Take 0.25 mg by mouth 2 (two) times daily as needed for anxiety.   citalopram 20 MG tablet Commonly known as:  CELEXA Take 30 mg by mouth every evening. Takes 1.5 tablet   doxazosin 2 MG tablet Commonly known as:  CARDURA Take 2 mg by mouth at bedtime.   EPIPEN IJ Inject 1 each as directed once.   HYDROmorphone 4 MG tablet Commonly known as:  DILAUDID Take 1 tablet (4 mg total) by mouth every 6 (six) hours as needed for moderate pain or severe pain.   hydrOXYzine 25 MG tablet Commonly known as:  ATARAX/VISTARIL Take 25-50 mg by mouth at bedtime.   ibuprofen 800 MG tablet Commonly known as:  ADVIL,MOTRIN Take 1 tablet (800 mg total) by mouth every 8 (eight) hours as needed for moderate pain or cramping.   metFORMIN 500 MG tablet Commonly known as:  GLUCOPHAGE Take 500 mg by mouth daily.   ondansetron 8 MG disintegrating tablet Commonly known as:  ZOFRAN ODT Take 1 tablet (8 mg total) by mouth every 8 (eight) hours as needed for nausea or vomiting.   PARAGARD INTRAUTERINE COPPER Iud IUD 1 each by Intrauterine route once.       Activity: activity as tolerated Diet: regular diet Wound Care: none needed  Follow-up:  With Dr. Johna SheriffHoxworth in 3 weeks.  Signed: Mariella SaaBenjamin T Geralyn Figiel MD, FACS  08/17/2017, 9:03 AM

## 2017-08-18 ENCOUNTER — Encounter: Payer: Self-pay | Admitting: *Deleted

## 2017-08-18 ENCOUNTER — Other Ambulatory Visit: Payer: Self-pay | Admitting: *Deleted

## 2017-08-18 LAB — GC/CHLAMYDIA PROBE AMP (~~LOC~~) NOT AT ARMC
Chlamydia: POSITIVE — AB
Neisseria Gonorrhea: NEGATIVE

## 2017-08-18 NOTE — Patient Outreach (Signed)
[facility] Care Management  08/18/2017  [name] 06/14/1986 [id number]   Subjective: Telephone call to patient's home number, spoke with patient, and HIPAA verified.  Discussed [name] Care Management [facility] Transition of care follow up, patient voiced understanding, and is in agreement to follow up.  Patient states she is doing much better, tolerating pain medication, family able to assist with activities of daily living / home management as needed, will call to step up follow up appointment with primary MD, and surgeon. Patient voices understanding of medical diagnosis, surgery, and treatment plan.  States she is accessing the following [facility] benefits: outpatient pharmacy, hospital indemnity (verbally given contact number for [facility] [phone], will call to verify benefit, file claim if appropriate), short term disability (verbally given contact number for [employer] disability [phone], will call to verify benefit, file claim if appropriate) and has called [employer] today to start family medical leave act (FMLA) process.  Patient states she does not have any education material, transition of care, care coordination, disease management, disease monitoring, transportation, community resource, or pharmacy needs at this time.   States she is very appreciative of the follow up and is in agreement to receive [facility] information.     Objective:  Per KPN (Knowledge Performance Now, point of care tool) and chart review, patient hospitalized 08/16/17 -08/17/17 for Acute appendicitis with localized peritonitis.    Status post APPENDECTOMY LAPAROSCOPIC on 08/16/17.    Patient has a history of Dermatographism and duodenitis.      Assessment: Received [facility] Transition of care referral on 08/18/17.  Transition of care follow up completed, no care management needs, and will proceed with case closure.     Plan:  RNCM will send patient successful outreach letter,  [facility] pamphlet, and magnet. RNCM will send case closure due to follow up completed / no care management needs request to [name].    Alexandra Sitts H. Gardiner Barefootooper RN, BSN, CCM [facility] Telephonic CM Phone: [phone] Fax: [phone]

## 2017-08-19 ENCOUNTER — Encounter (HOSPITAL_COMMUNITY): Payer: Self-pay | Admitting: General Surgery

## 2017-08-21 ENCOUNTER — Telehealth: Payer: Self-pay | Admitting: Student

## 2017-08-21 DIAGNOSIS — A749 Chlamydial infection, unspecified: Secondary | ICD-10-CM

## 2017-08-21 MED ORDER — AZITHROMYCIN 500 MG PO TABS: 1000.0000 mg | ORAL_TABLET | Freq: Once | ORAL | 0 refills | Status: AC

## 2017-08-21 NOTE — Telephone Encounter (Addendum)
Alexandra PondsHeather M Estes tested positive for  Chlamydia. Patient was called by RN and allergies and pharmacy confirmed. Rx sent to pharmacy of choice.   [name], Alexandra Danner, NP 08/21/2017 2:14 PM          ----- Message from Kathe BectonLori S Berdik, RN sent at 08/21/2017  2:04 PM EST ----- This patient tested positive for:  Chlamydia  She has allergies to "Penicillin"   I have informed the patient of her results and confirmed her pharmacy is correct in her chart. Please send Rx.   Thank you,   Kathe BectonBerdik, Lori S, RN   [facility]

## 2017-09-11 MED FILL — ALPRAZolam 0.25 MG TABS: 0.25 | 30 days supply | Qty: 60 | Fill #0

## 2017-09-24 DIAGNOSIS — R109 Unspecified abdominal pain: Secondary | ICD-10-CM | POA: Diagnosis not present

## 2017-09-24 DIAGNOSIS — R1900 Intra-abdominal and pelvic swelling, mass and lump, unspecified site: Secondary | ICD-10-CM | POA: Diagnosis not present

## 2017-09-25 DIAGNOSIS — F4310 Post-traumatic stress disorder, unspecified: Secondary | ICD-10-CM | POA: Diagnosis not present

## 2017-09-25 MED FILL — busPIRone HCL 30 MG TABS: 30 | 30 days supply | Qty: 60 | Fill #0

## 2017-09-25 MED FILL — DULoxetine HCL 60 MG CPEP: 60 | 30 days supply | Qty: 30 | Fill #0

## 2017-09-25 MED FILL — DOXAZOSIN MESYLATE 4 MG TAB: 4 | 90 days supply | Qty: 90 | Fill #0

## 2017-09-25 MED FILL — GABAPENTIN 600 MG TABLET: 600 | 30 days supply | Qty: 30 | Fill #0

## 2017-09-26 ENCOUNTER — Other Ambulatory Visit: Payer: Self-pay | Admitting: Obstetrics & Gynecology

## 2017-09-26 DIAGNOSIS — R1900 Intra-abdominal and pelvic swelling, mass and lump, unspecified site: Secondary | ICD-10-CM

## 2017-09-27 MED FILL — ALPRAZolam 0.25 MG TABS: 0.25 | 30 days supply | Qty: 90 | Fill #0

## 2017-09-29 DIAGNOSIS — J029 Acute pharyngitis, unspecified: Secondary | ICD-10-CM | POA: Diagnosis not present

## 2017-09-30 ENCOUNTER — Inpatient Hospital Stay: Admission: RE | Admit: 2017-09-30 | Payer: 59 | Source: Ambulatory Visit

## 2017-10-07 ENCOUNTER — Ambulatory Visit
Admission: RE | Admit: 2017-10-07 | Discharge: 2017-10-07 | Disposition: A | Discharge status: Home / Self Care | Payer: 59 | Source: Ambulatory Visit | Attending: Obstetrics & Gynecology | Admitting: Obstetrics & Gynecology

## 2017-10-07 DIAGNOSIS — R1900 Intra-abdominal and pelvic swelling, mass and lump, unspecified site: Secondary | ICD-10-CM

## 2017-10-07 DIAGNOSIS — R102 Pelvic and perineal pain: Secondary | ICD-10-CM | POA: Diagnosis not present

## 2017-10-07 MED ORDER — GADOBENATE DIMEGLUMINE 529 MG/ML IV SOLN
17.0000 mL | Freq: Once | INTRAVENOUS | Status: AC | PRN
  Administered 2017-10-07: 17 mL via INTRAVENOUS

## 2017-10-09 DIAGNOSIS — J209 Acute bronchitis, unspecified: Secondary | ICD-10-CM | POA: Diagnosis not present

## 2017-10-20 DIAGNOSIS — J40 Bronchitis, not specified as acute or chronic: Secondary | ICD-10-CM | POA: Diagnosis not present

## 2017-11-15 ENCOUNTER — Ambulatory Visit (INDEPENDENT_AMBULATORY_CARE_PROVIDER_SITE_OTHER): Payer: Self-pay

## 2017-11-15 ENCOUNTER — Other Ambulatory Visit: Payer: Self-pay

## 2017-11-15 ENCOUNTER — Ambulatory Visit (HOSPITAL_COMMUNITY)
Admission: EM | Admit: 2017-11-15 | Discharge: 2017-11-15 | Disposition: A | Discharge status: Home / Self Care | Payer: Self-pay

## 2017-11-15 ENCOUNTER — Encounter (HOSPITAL_COMMUNITY): Payer: Self-pay | Admitting: *Deleted

## 2017-11-15 DIAGNOSIS — S199XXA Unspecified injury of neck, initial encounter: Secondary | ICD-10-CM | POA: Diagnosis not present

## 2017-11-15 DIAGNOSIS — S299XXA Unspecified injury of thorax, initial encounter: Secondary | ICD-10-CM | POA: Diagnosis not present

## 2017-11-15 DIAGNOSIS — M542 Cervicalgia: Secondary | ICD-10-CM | POA: Diagnosis not present

## 2017-11-15 DIAGNOSIS — R51 Headache: Secondary | ICD-10-CM | POA: Diagnosis not present

## 2017-11-15 DIAGNOSIS — N390 Urinary tract infection, site not specified: Secondary | ICD-10-CM | POA: Diagnosis not present

## 2017-11-15 DIAGNOSIS — M25511 Pain in right shoulder: Secondary | ICD-10-CM

## 2017-11-15 DIAGNOSIS — M5489 Other dorsalgia: Secondary | ICD-10-CM

## 2017-11-15 DIAGNOSIS — S20219A Contusion of unspecified front wall of thorax, initial encounter: Secondary | ICD-10-CM | POA: Diagnosis not present

## 2017-11-15 DIAGNOSIS — M25512 Pain in left shoulder: Secondary | ICD-10-CM

## 2017-11-15 DIAGNOSIS — S161XXA Strain of muscle, fascia and tendon at neck level, initial encounter: Secondary | ICD-10-CM

## 2017-11-15 MED ORDER — NAPROXEN 500 MG PO TABS: 500.0000 mg | ORAL_TABLET | Freq: Two times a day (BID) | ORAL | 0 refills | Status: DC

## 2017-11-15 MED ORDER — CYCLOBENZAPRINE HCL 5 MG PO TABS: 5.0000 mg | ORAL_TABLET | Freq: Two times a day (BID) | ORAL | 0 refills | Status: DC | PRN
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
## 2017-11-15 NOTE — Discharge Instructions (Signed)
Naproxen twice a day, take with food. May use flexeril twice a day as needed for muscle spasm. May cause drowsiness so do not take before driving or with alcohol. Soft neck collar for support and comfort. Please make a follow up appointment with your primary care provider for recheck later this week. If developing worsening of symptoms, weakness to arm, numbness or otherwise worsening please go to the er.
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
## 2017-11-18 DIAGNOSIS — F4310 Post-traumatic stress disorder, unspecified: Secondary | ICD-10-CM | POA: Diagnosis not present

## 2017-11-18 MED FILL — ALPRAZolam 0.25 MG TABS: 0.25 | 30 days supply | Qty: 90 | Fill #0

## 2017-11-18 MED FILL — DULoxetine HCL 60 MG CPEP: 60 | 90 days supply | Qty: 90 | Fill #0

## 2017-11-18 MED FILL — busPIRone HCL 30 MG TABS: 30 | 90 days supply | Qty: 180 | Fill #0

## 2017-11-18 MED FILL — GABAPENTIN 600 MG TABLET: 600 | 90 days supply | Qty: 90 | Fill #0

## 2017-12-22 MED FILL — ALPRAZolam 0.25 MG TABS: 0.25 | 30 days supply | Qty: 90 | Fill #1

## 2018-01-06 ENCOUNTER — Encounter (HOSPITAL_COMMUNITY): Payer: Self-pay | Admitting: *Deleted

## 2018-01-06 ENCOUNTER — Inpatient Hospital Stay (HOSPITAL_COMMUNITY): Payer: 59

## 2018-01-06 ENCOUNTER — Inpatient Hospital Stay (HOSPITAL_COMMUNITY)
Admission: AD | Admit: 2018-01-06 | Discharge: 2018-01-06 | Disposition: A | Discharge status: Home / Self Care | Payer: 59 | Source: Ambulatory Visit | Attending: Obstetrics & Gynecology | Admitting: Obstetrics & Gynecology

## 2018-01-06 DIAGNOSIS — Z79899 Other long term (current) drug therapy: Secondary | ICD-10-CM | POA: Insufficient documentation

## 2018-01-06 DIAGNOSIS — F419 Anxiety disorder, unspecified: Secondary | ICD-10-CM | POA: Insufficient documentation

## 2018-01-06 DIAGNOSIS — E282 Polycystic ovarian syndrome: Secondary | ICD-10-CM | POA: Diagnosis not present

## 2018-01-06 DIAGNOSIS — N888 Other specified noninflammatory disorders of cervix uteri: Secondary | ICD-10-CM | POA: Insufficient documentation

## 2018-01-06 DIAGNOSIS — Z880 Allergy status to penicillin: Secondary | ICD-10-CM | POA: Diagnosis not present

## 2018-01-06 DIAGNOSIS — K219 Gastro-esophageal reflux disease without esophagitis: Secondary | ICD-10-CM | POA: Insufficient documentation

## 2018-01-06 DIAGNOSIS — R102 Pelvic and perineal pain: Secondary | ICD-10-CM | POA: Insufficient documentation

## 2018-01-06 DIAGNOSIS — Z975 Presence of (intrauterine) contraceptive device: Secondary | ICD-10-CM | POA: Insufficient documentation

## 2018-01-06 DIAGNOSIS — Z87891 Personal history of nicotine dependence: Secondary | ICD-10-CM | POA: Diagnosis not present

## 2018-01-06 DIAGNOSIS — N800 Endometriosis of uterus: Secondary | ICD-10-CM | POA: Diagnosis not present

## 2018-01-06 DIAGNOSIS — R109 Unspecified abdominal pain: Secondary | ICD-10-CM | POA: Diagnosis present

## 2018-01-06 LAB — POCT PREGNANCY, URINE: Preg Test, Ur: NEGATIVE

## 2018-01-06 LAB — URINALYSIS, ROUTINE W REFLEX MICROSCOPIC
Bacteria, UA: NONE SEEN
Bilirubin Urine: NEGATIVE
GLUCOSE, UA: NEGATIVE mg/dL
Ketones, ur: NEGATIVE mg/dL
Leukocytes, UA: NEGATIVE
Nitrite: NEGATIVE
Protein, ur: NEGATIVE mg/dL
Specific Gravity, Urine: 1.029 (ref 1.005–1.030)
pH: 6 (ref 5.0–8.0)

## 2018-01-06 LAB — WET PREP, GENITAL
CLUE CELLS WET PREP: NONE SEEN
SPERM: NONE SEEN
TRICH WET PREP: NONE SEEN
YEAST WET PREP: NONE SEEN

## 2018-01-06 MED ORDER — IBUPROFEN 800 MG PO TABS
800.0000 mg | ORAL_TABLET | Freq: Once | ORAL | Status: AC
  Administered 2018-01-06: 800 mg via ORAL
  Filled 2018-01-06: qty 1

## 2018-01-06 MED ORDER — IBUPROFEN 800 MG PO TABS: 800.0000 mg | ORAL_TABLET | Freq: Three times a day (TID) | ORAL | Status: DC

## 2018-01-06 NOTE — MAU Note (Signed)
Hx of scar tissue around her ovaries and ovarian cysts.  States that is what this pain feels like. Currently on her period, changing a pad every 2 hours-saturating each time.  Also passing clots, states she does not usually bleed this much during her periods.  Has not taken anything for the pain.  Called the office and left messages for the past week but hasn't heard back.   Had scar tissue removed last February and some removed during appendectomy in November.

## 2018-01-06 NOTE — Discharge Instructions (Signed)
Pelvic Pain, Female °Pelvic pain is pain in your lower belly (abdomen), below your belly button and between your hips. The pain may start suddenly (acute), keep coming back (recurring), or last a long time (chronic). Pelvic pain that lasts longer than six months is considered chronic. There are many causes of pelvic pain. Sometimes the cause of your pelvic pain is not known. °Follow these instructions at home: °· Take over-the-counter and prescription medicines only as told by your doctor. °· Rest as told by your doctor. °· Do not have sex it if hurts. °· Keep a journal of your pelvic pain. Write down: °? When the pain started. °? Where the pain is located. °? What seems to make the pain better or worse, such as food or your menstrual cycle. °? Any symptoms you have along with the pain. °· Keep all follow-up visits as told by your doctor. This is important. °Contact a doctor if: °· Medicine does not help your pain. °· Your pain comes back. °· You have new symptoms. °· You have unusual vaginal discharge or bleeding. °· You have a fever or chills. °· You are having a hard time pooping (constipation). °· You have blood in your pee (urine) or poop (stool). °· Your pee smells bad. °· You feel weak or lightheaded. °Get help right away if: °· You have sudden pain that is very bad. °· Your pain continues to get worse. °· You have very bad pain and also have any of the following symptoms: °? A fever. °? Feeling stick to your stomach (nausea). °? Throwing up (vomiting). °? Being very sweaty. °· You pass out (lose consciousness). °This information is not intended to replace advice given to you by your health care provider. Make sure you discuss any questions you have with your health care provider. °Document Released: 02/26/2008 Document Revised: 10/04/2015 Document Reviewed: 06/30/2015 °Elsevier Interactive Patient Education © 2018 [employer] ° °

## 2018-01-06 NOTE — MAU Provider Note (Signed)
Chief Complaint: Abdominal Pain   First Provider Initiated Contact with Patient 01/06/18 0255     SUBJECTIVE HPI: [name] is a 32 y.o. G1P0101 at Unknown who presents to [facility] reporting left sided pain.  Pt states has had left sided pain since having her appendectomy in November.  Has been worse for last two weeks.  No otc today.  States Motrin not helping.  Had MRI done in December and was noted to adenomyosis.  Pt has Copper T IUD and is currently on her period.  LMP 4/14.   Location: Left sided pain Quality: cramping Severity: 10/10 on pain scale Pt smiling and talking on cell phone Duration: 2 weeks  Modifying factors: Has not taken anything. Associated signs and symptoms: Denies  Past Medical History:  Diagnosis Date  . Allergy    seasonal  . Anxiety   . Aspergillosis (HCC)    sinus  . Chronic idiopathic urticaria   . Complication of anesthesia    prolonged sedation, could not breath after tube removal from sinus surgery, required breathing treatment and was admitted for overnight observation  . Dermatographism   . Duodenitis   . GERD (gastroesophageal reflux disease)   . Herpes   . PCOS (polycystic ovarian syndrome)   . PONV (postoperative nausea and vomiting)    vomiting after tonsillectomy, 2008   OB History  Gravida Para Term Preterm AB Living  1 1   1   1   SAB TAB Ectopic Multiple Live Births          1    # Outcome Date GA Lbr Len/2nd Weight Sex Delivery Anes PTL Lv  1 Preterm 08/21/07    M       Past Surgical History:  Procedure Laterality Date  . APPENDECTOMY    . CERVICAL CERCLAGE    . ESOPHAGOGASTRODUODENOSCOPY    . KNEE SURGERY Right 2007  . LAPAROSCOPIC APPENDECTOMY N/A 08/16/2017   Procedure: APPENDECTOMY LAPAROSCOPIC;  Surgeon: Glenna Fellows, MD;  Location: [facility];  Service: General;  Laterality: N/A;  . LAPAROSCOPIC LYSIS OF ADHESIONS N/A 10/31/2016   Procedure: LAPAROSCOPIC LYSIS OF ADHESIONS;  Surgeon: Hoover Browns, MD;   Location: [facility];  Service: Gynecology;  Laterality: N/A;  . LAPAROSCOPY N/A 10/31/2016   Procedure: LAPAROSCOPY DIAGNOSTIC;  Surgeon: Hoover Browns, MD;  Location: [facility];  Service: Gynecology;  Laterality: N/A;  . NASAL SINUS SURGERY    . TONSILLECTOMY  2008  . WISDOM TOOTH EXTRACTION     Social History   Socioeconomic History  . Marital status: Single    Spouse name: Not on file  . Number of children: 1  . Years of education: Not on file  . Highest education level: Not on file  Occupational History  . Occupation: phlebotomy    Employer: [employer]  Social Needs  . Financial resource strain: Not on file  . Food insecurity:    Worry: Not on file    Inability: Not on file  . Transportation needs:    Medical: Not on file    Non-medical: Not on file  Tobacco Use  . Smoking status: Former Smoker    Packs/day: 1.00    Years: 1.00    Pack years: 1.00    Types: Cigarettes    Last attempt to quit: 05/24/2016    Years since quitting: 1.6  . Smokeless tobacco: Never Used  Substance and Sexual Activity  . Alcohol use: No    Alcohol/week: 0.0 oz  .  Drug use: No  . Sexual activity: Yes    Comment: [place]  Lifestyle  . Physical activity:    Days per week: Not on file    Minutes per session: Not on file  . Stress: Not on file  Relationships  . Social connections:    Talks on phone: Not on file    Gets together: Not on file    Attends religious service: Not on file    Active member of club or organization: Not on file    Attends meetings of clubs or organizations: Not on file    Relationship status: Not on file  . Intimate partner violence:    Fear of current or ex partner: Not on file    Emotionally abused: Not on file    Physically abused: Not on file    Forced sexual activity: Not on file  Other Topics Concern  . 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 Allergies Sister   . Allergies Mother   . Asthma Mother   . Allergies Father    No current facility-administered medications on file prior to encounter.    Current Outpatient Medications on File Prior to Encounter  Medication Sig Dispense Refill  . ALPRAZolam (XANAX) 0.25 MG tablet Take 0.25 mg by mouth 2 (two) times daily as needed for anxiety.    . DULoxetine (CYMBALTA) 20 MG capsule Take by mouth daily.    Marland Kitchen gabapentin (NEURONTIN) 100 MG capsule Take by mouth as needed.    . hydrOXYzine (ATARAX/VISTARIL) 25 MG tablet Take 25-50 mg by mouth at bedtime.    Marland Kitchen ibuprofen (ADVIL,MOTRIN) 800 MG tablet Take 1 tablet (800 mg total) by mouth every 8 (eight) hours as needed for moderate pain or cramping. 30 tablet 0  . cyclobenzaprine (FLEXERIL) 5 MG tablet Take 1 tablet (5 mg total) by mouth 2 (two) times daily as needed for muscle spasms. 20 tablet 0  . doxazosin (CARDURA) 2 MG tablet Take 2 mg by mouth at bedtime.     Marland Kitchen EPINEPHrine (EPIPEN IJ) Inject 1 each as directed once.    Marland Kitchen HYDROmorphone (DILAUDID) 4 MG tablet Take 1 tablet (4 mg total) by mouth every 6 (six) hours as needed for moderate pain or severe pain. 15 tablet 0  . metFORMIN (GLUCOPHAGE) 500 MG tablet Take 500 mg by mouth daily.  1  . naproxen (NAPROSYN) 500 MG tablet Take 1 tablet (500 mg total) by mouth 2 (two) times daily. 30 tablet 0  . ondansetron (ZOFRAN ODT) 8 MG disintegrating tablet Take 1 tablet (8 mg total) by mouth every 8 (eight) hours as needed for nausea or vomiting. 20 tablet 0  . PARAGARD INTRAUTERINE COPPER IUD IUD 1 each by Intrauterine route once.     Allergies  Allergen Reactions  . Penicillins Anaphylaxis    ++++ABLE TO TAKE ROCEPHIN++++ Has patient had a PCN reaction causing immediate rash, facial/tongue/throat swelling, SOB or lightheadedness with hypotension: yes Has patient had a PCN reaction causing severe rash involving mucus membranes or skin necrosis: unknown Has patient  had a PCN reaction that required hospitalization no Has patient had a PCN reaction occurring within the last 10 years: yes If all of the above answers are "NO", then may proceed with Cephalosporin use.   . Shrimp [Shellfish Allergy] Anaphylaxis  .  Eggs Or Egg-Derived Products Nausea Only    Yolk  . Soy Allergy Nausea Only  . Wheat Bran Other (See Comments)    inflammation    I have reviewed patient's Past Medical Hx, Surgical Hx, Family Hx, Social Hx, medications and allergies.   Review of Systems  Constitutional: Negative.   HENT: Negative.   Eyes: Negative.   Respiratory: Negative.   Cardiovascular: Negative.   Gastrointestinal: Negative.   Endocrine: Negative.   Genitourinary: Positive for pelvic pain.  Musculoskeletal: Negative.   Skin: Negative.   Allergic/Immunologic: Negative.   Neurological: Negative.   Hematological: Negative.   Psychiatric/Behavioral: Negative.     OBJECTIVE Patient Vitals for the past 24 hrs:  BP Temp Temp src Pulse Resp Height Weight  01/06/18 0132 129/79 98.3 F (36.8 C) Oral 91 19 5\' 4"  (1.626 m) 84.5 kg (186 lb 4 oz)   Constitutional: Well-developed, well-nourished female in no acute distress.  Cardiovascular: normal rate Respiratory: normal rate and effort.  GI: Abd soft, non-tender, gravid appropriate for gestational age. Pos BS x 4 MS: Extremities nontender, no edema, normal ROM Neurologic: Alert and oriented x 4.  GU: Neg CVAT.  SPECULUM EXAM: NEFG, physiologic discharge, mod blood noted, cervix clean  IUD strings visualized  BIMANUAL: cervix closed ; uterus normal size, no adnexal tenderness or masses.  No CMT.  LAB RESULTS Results for orders placed or performed during the hospital encounter of 01/06/18 (from the past 24 hour(s))  Urinalysis, Routine w reflex microscopic     Status: Abnormal   Collection Time: 01/06/18  1:36 AM  Result Value Ref Range   Color, Urine YELLOW YELLOW   APPearance HAZY (A) CLEAR   Specific Gravity,  Urine 1.029 1.005 - 1.030   pH 6.0 5.0 - 8.0   Glucose, UA NEGATIVE NEGATIVE mg/dL   Hgb urine dipstick LARGE (A) NEGATIVE   Bilirubin Urine NEGATIVE NEGATIVE   Ketones, ur NEGATIVE NEGATIVE mg/dL   Protein, ur NEGATIVE NEGATIVE mg/dL   Nitrite NEGATIVE NEGATIVE   Leukocytes, UA NEGATIVE NEGATIVE   RBC / HPF TOO NUMEROUS TO COUNT 0 - 5 RBC/hpf   WBC, UA 0-5 0 - 5 WBC/hpf   Bacteria, UA NONE SEEN NONE SEEN   Squamous Epithelial / LPF 0-5 (A) NONE SEEN   Mucus PRESENT   Pregnancy, urine POC     Status: None   Collection Time: 01/06/18  2:24 AM  Result Value Ref Range   Preg Test, Ur NEGATIVE NEGATIVE    IMAGING [place] Pelvic Complete With Transvaginal  Result Date: 01/06/2018 CLINICAL DATA:  Left greater than right pelvic pain EXAM: TRANSABDOMINAL AND TRANSVAGINAL ULTRASOUND OF PELVIS TECHNIQUE: Both transabdominal and transvaginal ultrasound examinations of the pelvis were performed. Transabdominal technique was performed for global imaging of the pelvis including uterus, ovaries, adnexal regions, and pelvic cul-de-sac. It was necessary to proceed with endovaginal exam following the transabdominal exam to visualize the uterus endometrium and ovaries. COMPARISON:  None FINDINGS: Uterus Measurements: 8.3 x 4.9 x 4.9 cm.  Nabothian cysts in the cervix. Endometrium Thickness: Not able to be measured. Obscured by IUD. IUD appears appropriately positioned. Right ovary Measurements: 3.7 x 2.5 x 2.1 cm. Normal appearance/no adnexal mass. Left ovary Measurements: 3.3 x 2.3 x 2.4 cm. Normal appearance/no adnexal mass. Other findings No abnormal free fluid. IMPRESSION: 1. IUD appears appropriately positioned by ultrasound 2. There are no significant abnormalities. Electronically Signed   By: Jasmine PangKim  Fujinaga M.D.   On: 01/06/2018 02:39    MAU  COURSE Orders Placed This Encounter  Procedures  . Wet prep, genital  . US PELVIC COMPLETE WITH TRANSVAGINAL  . Urinalysis, Routine w reflex microscopic  .  Pregnancy, urine POC   No orders of the defined types were placed in this encounter. wet mount negative  MDM PE, VSS wet prep and [place].  Exam unremarkable. ASSESSMENT 1. Pelvic pain in female     PLAN Discussed with pt diagnosis of adenomyosis per office chart and using ibuprofen for pain.  Discussed following up in office for management.   Discharge home in stable condition.      Kenney Houseman, CNM 01/06/2018  3:26 AM

## 2018-01-07 LAB — GC/CHLAMYDIA PROBE AMP (~~LOC~~) NOT AT ARMC
Chlamydia: NEGATIVE
Neisseria Gonorrhea: NEGATIVE

## 2018-01-08 DIAGNOSIS — R102 Pelvic and perineal pain: Secondary | ICD-10-CM | POA: Diagnosis not present

## 2018-01-08 DIAGNOSIS — N800 Endometriosis of uterus: Secondary | ICD-10-CM | POA: Diagnosis not present

## 2018-02-02 IMAGING — CT CT ABD-PELV W/ CM
1 of 2 series · 14 of 32 positions shown, 18 images · IV contrast (OMNIPAQUE)
Comparison: 09/09/2016, 06/27/2016

CLINICAL DATA: 31-year-old female with a history of lower abdominal
pain for 3 days

EXAM:
CT ABDOMEN AND PELVIS WITH CONTRAST
TECHNIQUE: Multidetector CT imaging of the abdomen and pelvis was performed
using the standard protocol following bolus administration of
intravenous contrast.
CONTRAST:  100mL [id number] IOPAMIDOL ([id number]) INJECTION 61%

[Series 2: routine abdomen/pelvis with · axial · 0.80mm/px · z∈[-460,-40]mm · 14 of 96 slices shown, 18 images]
[im 8/96  soft-tissue]
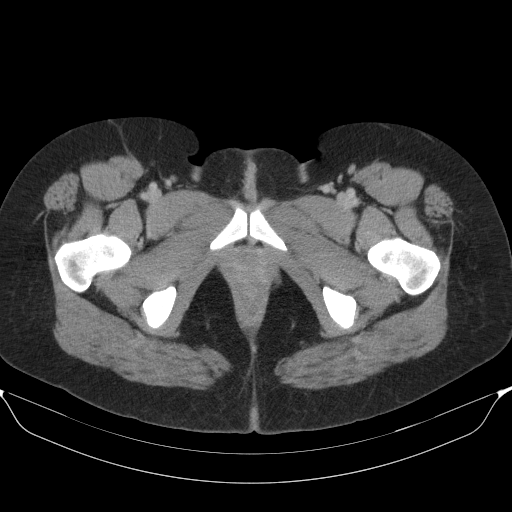
[im 8/96  bone]
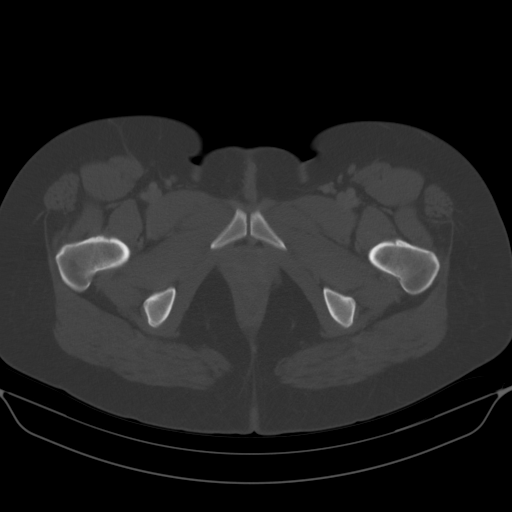
[im 16/96  soft-tissue]
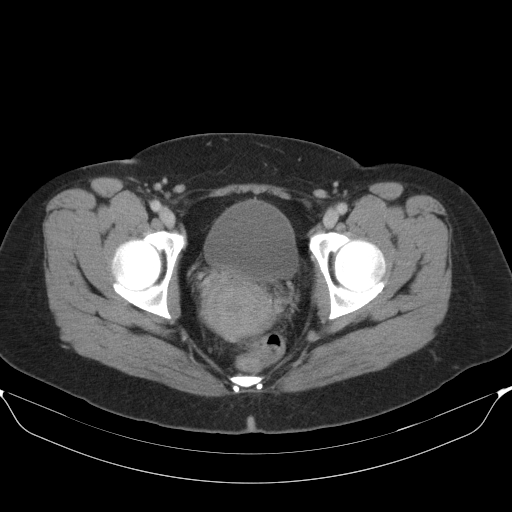
[im 23/96  soft-tissue]
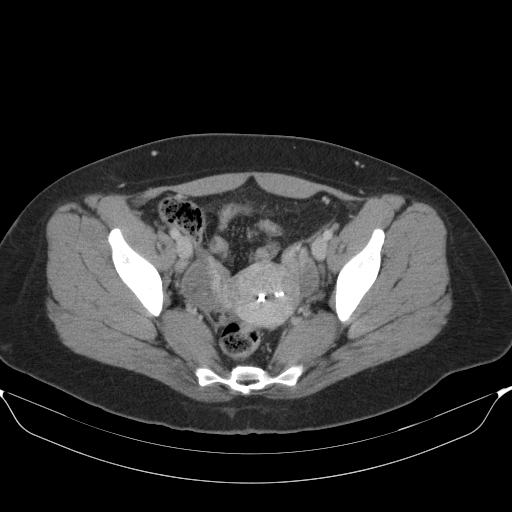
[im 31/96  soft-tissue]
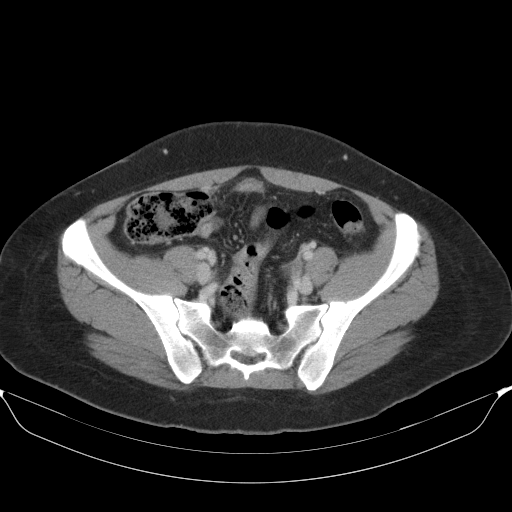
[im 39/96  soft-tissue]
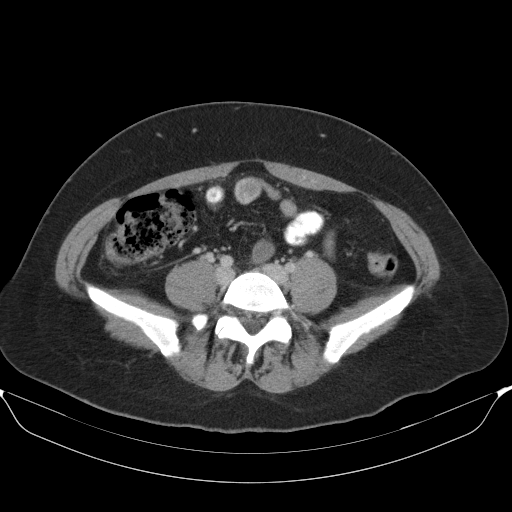
[im 46/96  soft-tissue]
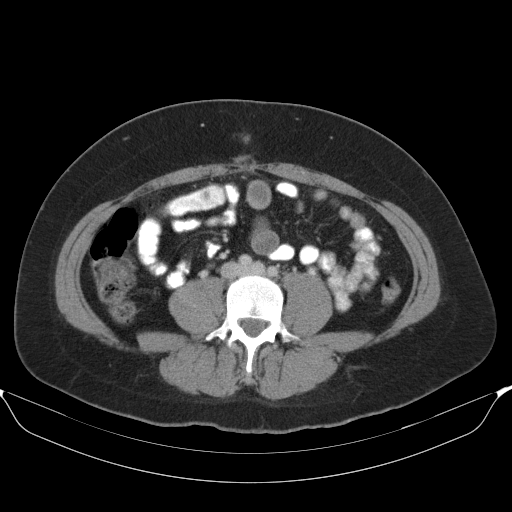
[im 54/96  soft-tissue]
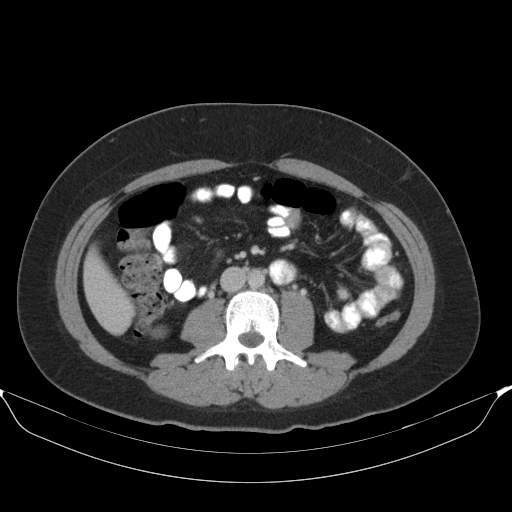
[im 61/96  soft-tissue]
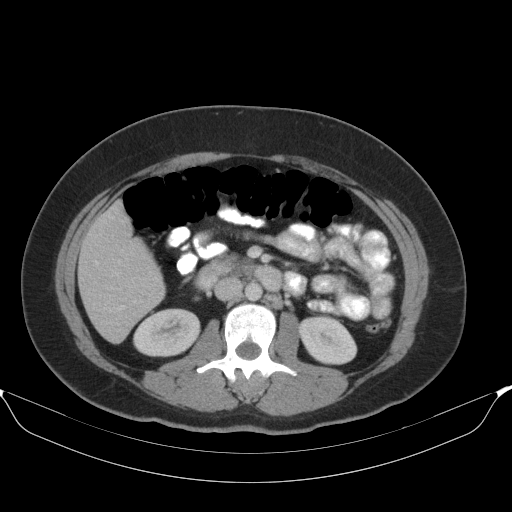
[im 69/96  soft-tissue]
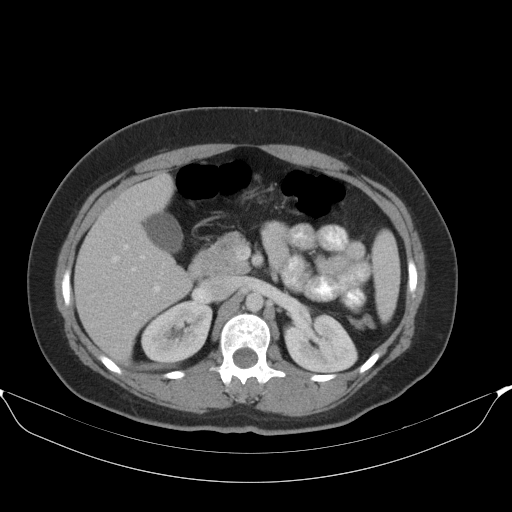
[im 69/96  bone]
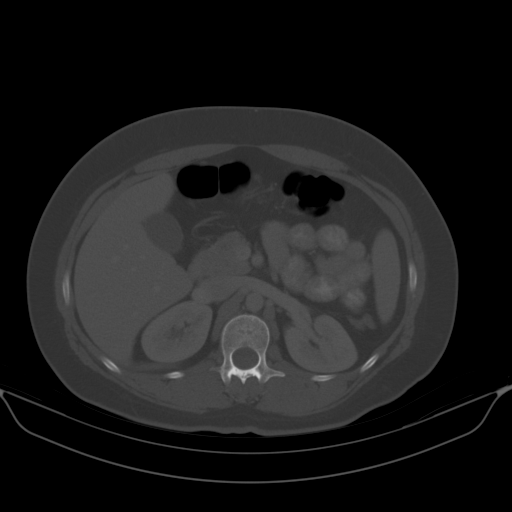
[im 77/96  soft-tissue]
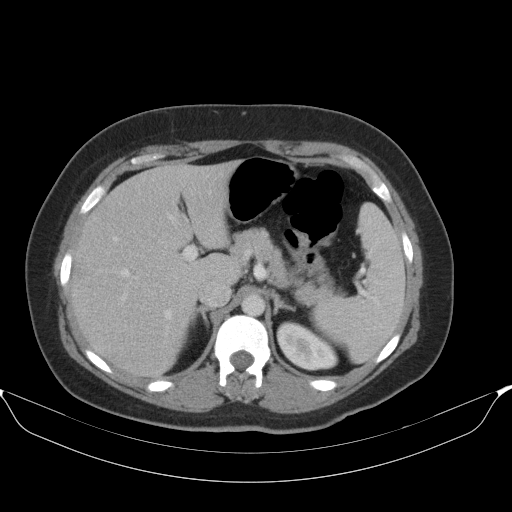
[im 80/96  lung]
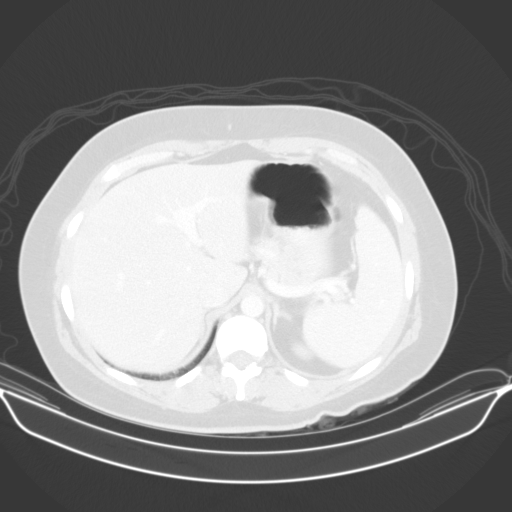
[im 84/96  soft-tissue]
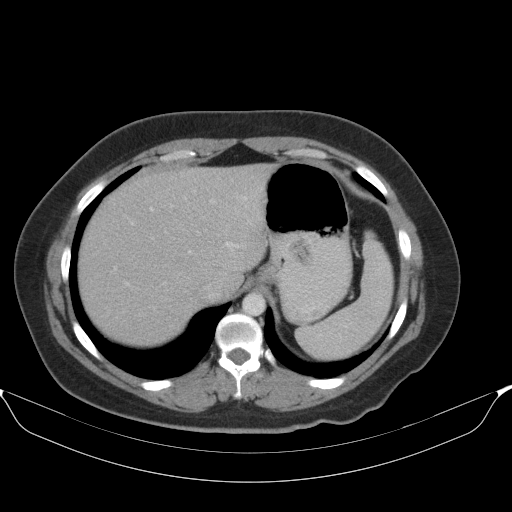
[im 84/96  lung]
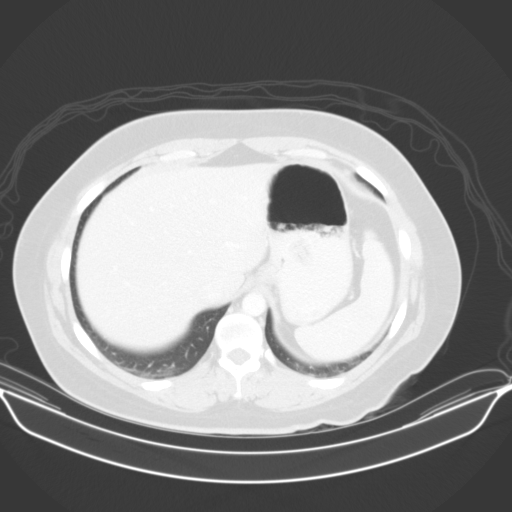
[im 88/96  lung]
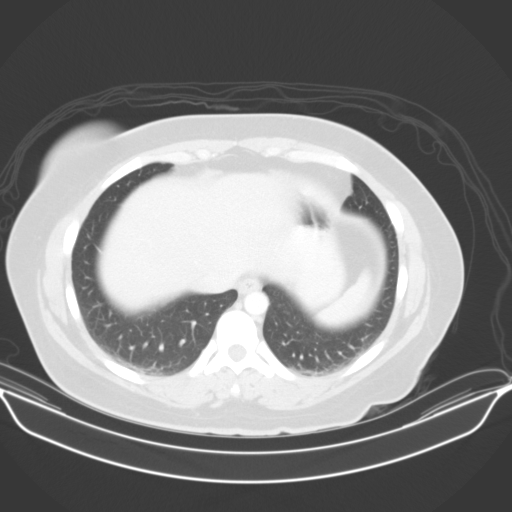
[im 92/96  soft-tissue]
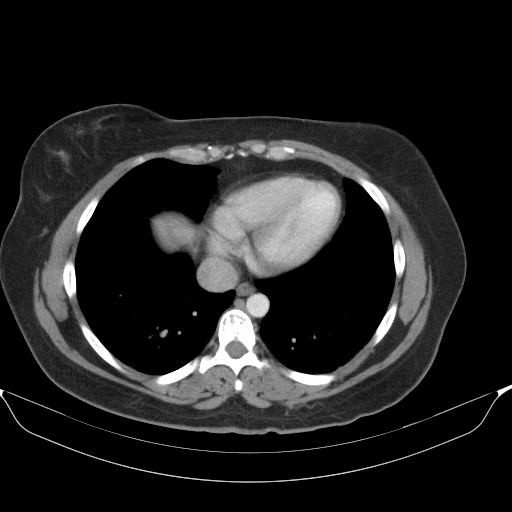
[im 92/96  lung]
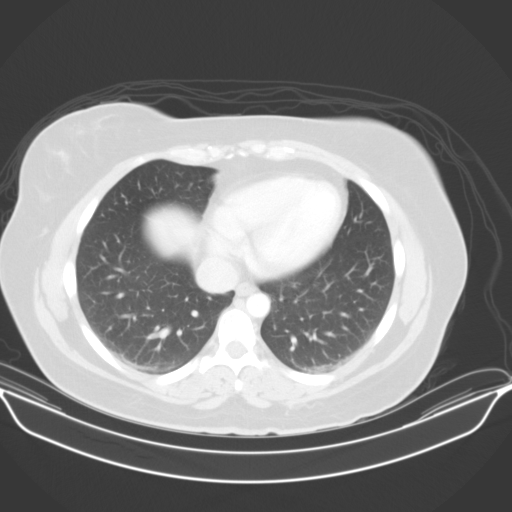

[14 of 32 positions shown; findings below may reference images not displayed]

FINDINGS: Lower chest: No acute abnormality.

Hepatobiliary: Unremarkable liver.  Unremarkable gallbladder.

Pancreas: Unremarkable pancreas

Spleen: Unremarkable spleen

Adrenals/Urinary Tract: Unremarkable bilateral adrenal glands.

Unremarkable bilateral kidneys. Unremarkable course the bilateral
ureters.

Unremarkable appearance of the urinary bladder.

Stomach/Bowel: Unremarkable stomach. Unremarkable small bowel with
no focal wall thickening.

Mild diverticular change without evidence of acute diverticulitis.

Based appendix unremarkable, however, the tip appendix is thickened
and indistinct, located medially from the cecum overlying the right
adnexa and inseparable from the dome of the uterus. Location similar
to the prior CT of 1 year ago, with new inflammatory changes.
Associated inflammatory changes within the adjacent small bowel
loops, with trace fluid and edema.

Appendix: Location: Medial to the cecum adjacent to uterusDiameter:
12 mmAppendicolith: NoMucosal hyper-enhancement: YesExtraluminal
gas: NoPeriappendiceal collection: No

Vascular/Lymphatic: No significant vascular disease. No aneurysm or
dissection. Iliac and proximal femoral vasculature patent.

Reproductive: Small amount of fluid within the endometrial canal,
unchanged from prior. Intrauterine device. Physiologic changes of
the bilateral at adnexa.

Other: Small fat containing umbilical hernia.

Musculoskeletal: No acute fracture.
IMPRESSION: CT demonstrates a tip appendicitis, without abscess or perforation.
Differential would include a mucocele, although the appendix was
normal on prior CT. Note that the appendix is medial from the cecum,
in the midline, inseparable from the right adnexa and the dome of
the uterus.

These results were called by telephone at the time of interpretation
on 08/16/2017 at [DATE] to the nurse caring for the patient, Ms
Rolan Osteen who verbally acknowledged these results.

Intrauterine device. Physiologic changes of the bilateral at adnexa.

## 2018-02-02 IMAGING — US US PELV - US TRANSVAGINAL
1 series · 15 of 25 positions shown · non-contrast
Comparison: Ultrasound September 09, 2016. CT scan [DATE]

CLINICAL DATA: Acute lower abdominal pain.

EXAM:
TRANSABDOMINAL AND TRANSVAGINAL ULTRASOUND OF PELVIS
DOPPLER ULTRASOUND OF OVARIES
TECHNIQUE: Both transabdominal and transvaginal ultrasound examinations of the
pelvis were performed. Transabdominal technique was performed for
global imaging of the pelvis including uterus, ovaries, adnexal
regions, and pelvic cul-de-sac.
It was necessary to proceed with endovaginal exam following the
transabdominal exam to visualize the ovaries. Color and duplex
Doppler ultrasound was utilized to evaluate blood flow to the
ovaries.

[Series 1: us pelv - us transvaginal · 15 of 76 slices shown]
[im 1/76]
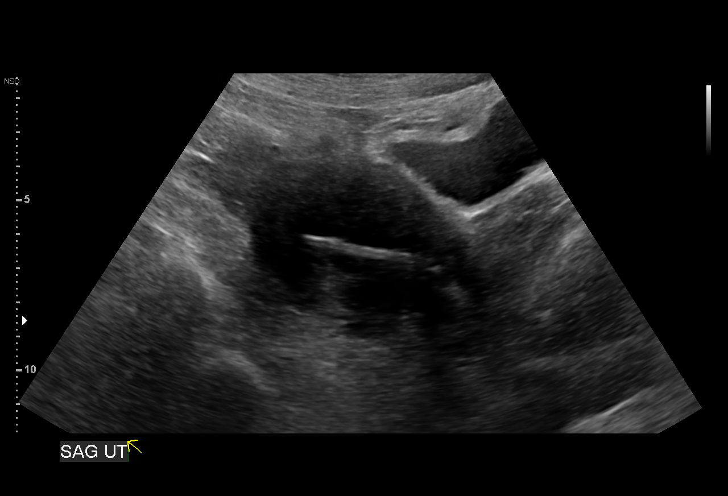
[im 7/76]
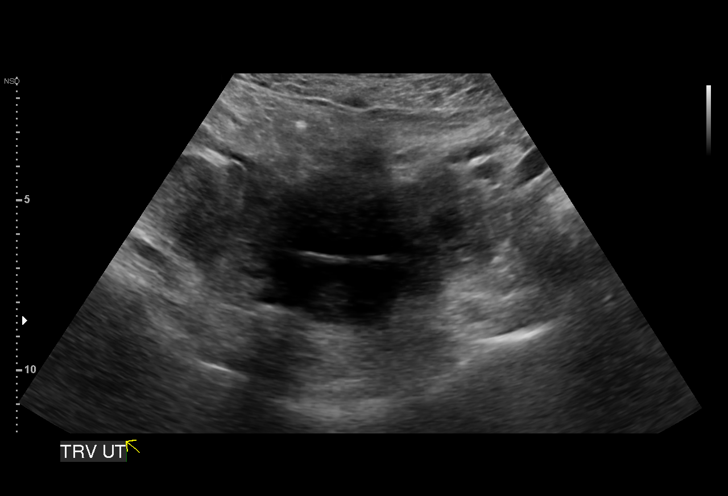
[im 13/76]
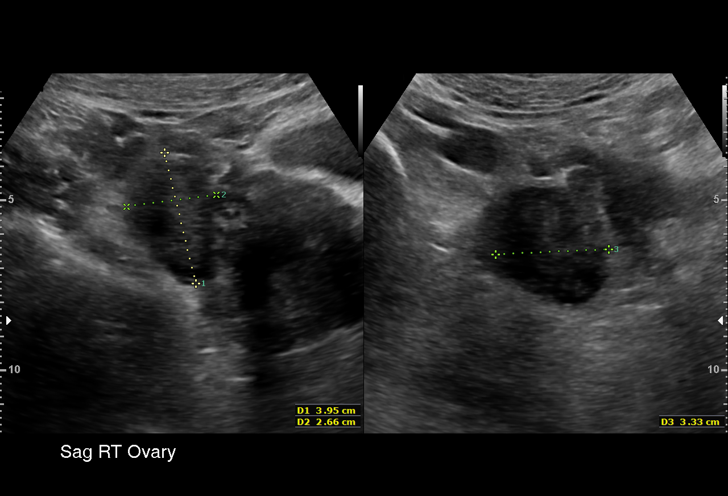
[im 16/76]
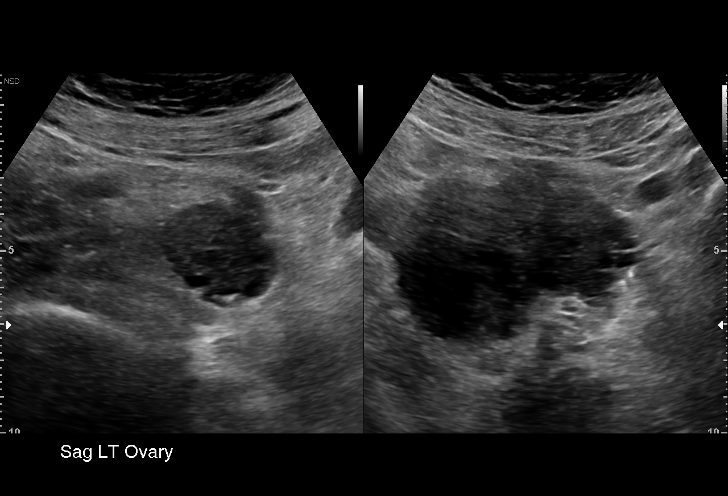
[im 22/76]
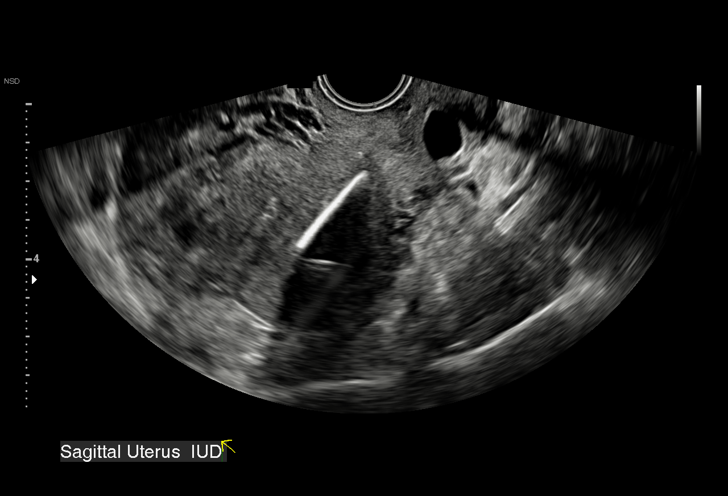
[im 29/76]
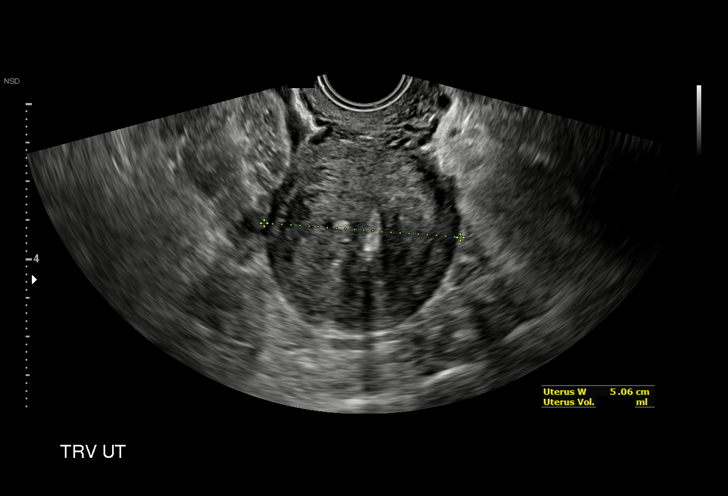
[im 32/76]
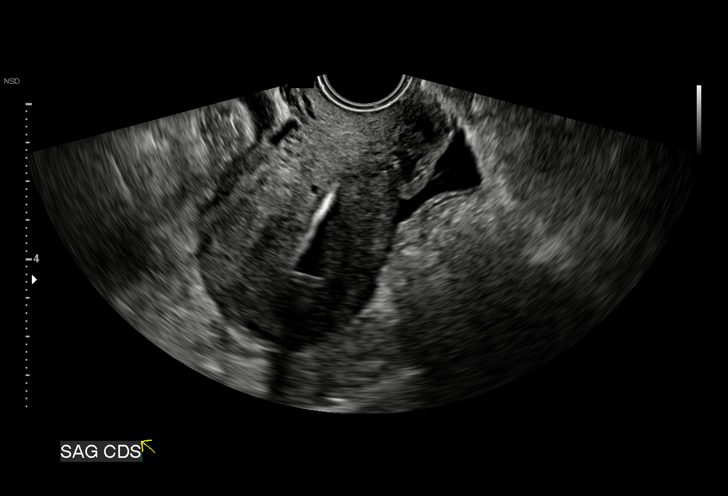
[im 38/76]
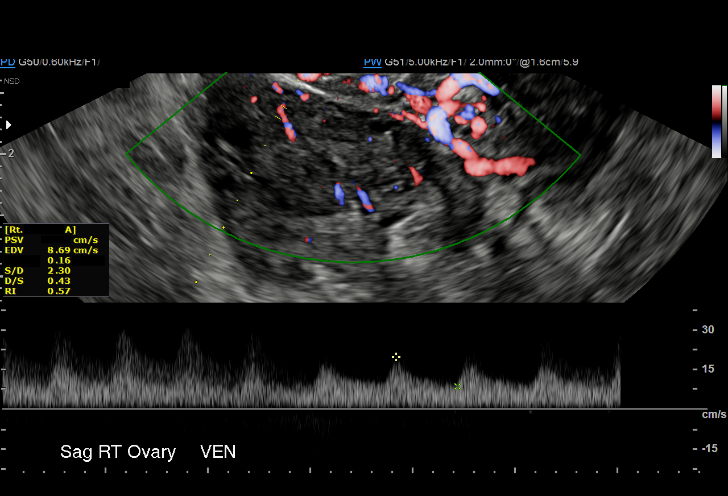
[im 44/76]
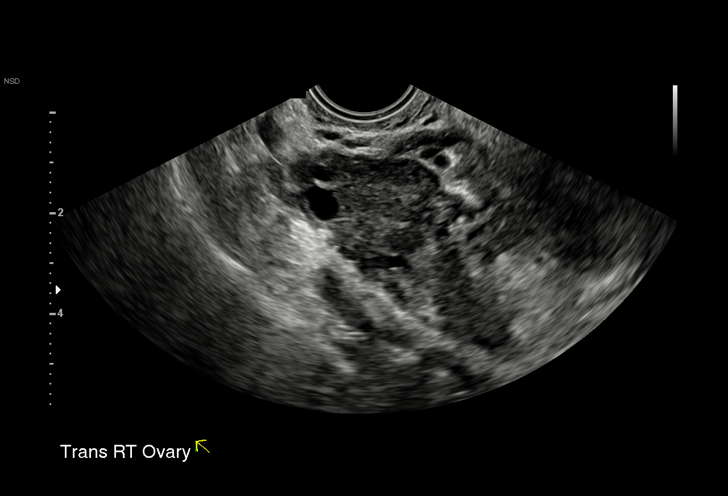
[im 47/76]
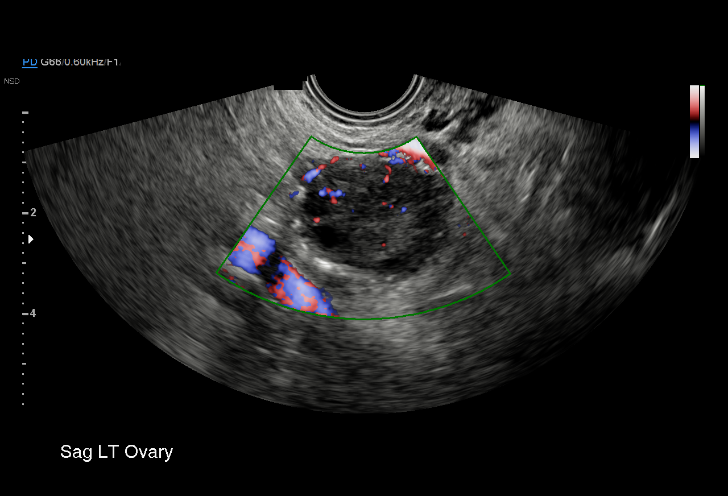
[im 54/76]
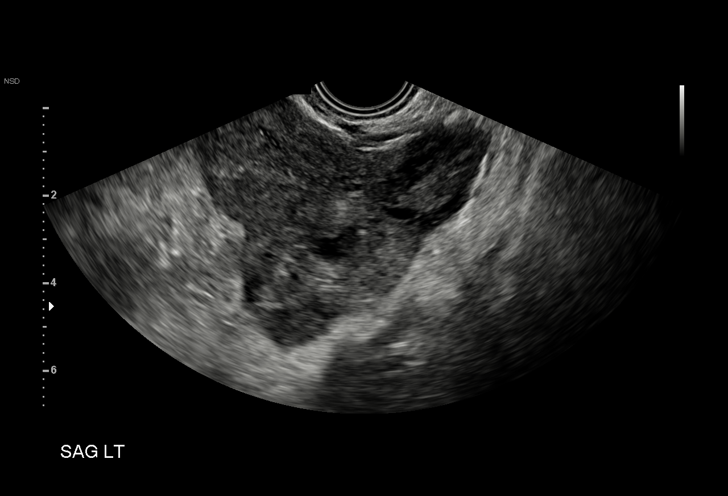
[im 60/76]
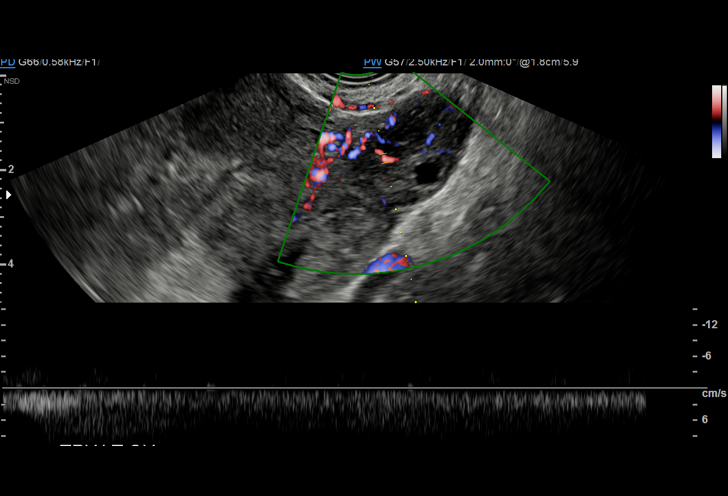
[im 63/76]
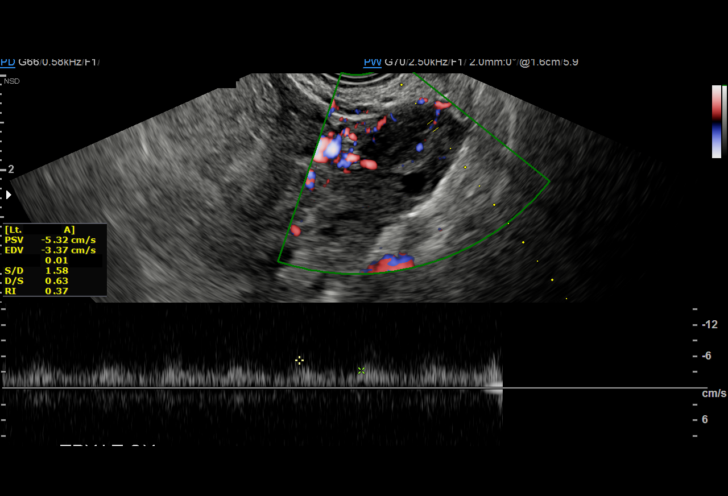
[im 69/76]
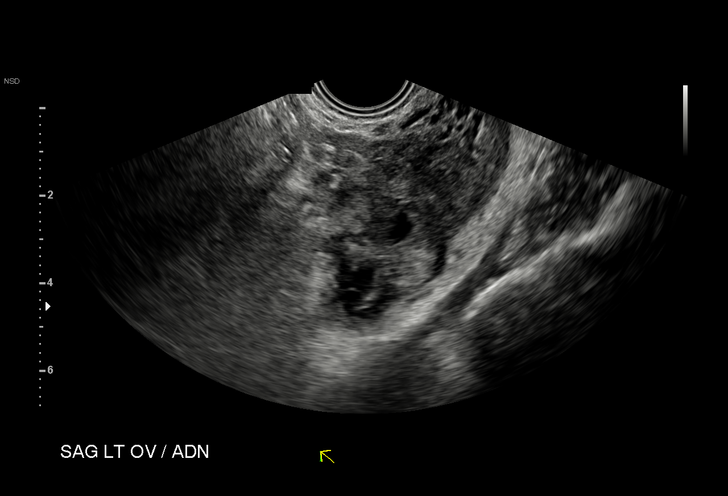
[im 76/76]
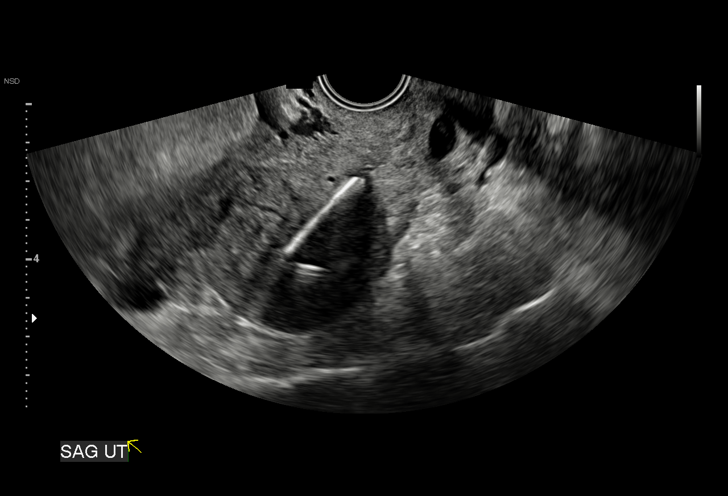

[15 of 25 positions shown; findings below may reference images not displayed]

FINDINGS: Uterus

Measurements: 8.5 x 5.6 x 5.1 cm. No fibroids or other mass
visualized.

Endometrium

Thickness: 6 mm.  Intrauterine device is noted.

Right ovary

Measurements: 4.4 x 2.9 x 2.5 cm. Normal appearance/no adnexal mass.

Left ovary

Measurements: 3.3 x 2.6 x 2.3 cm. 4.1 x 3.3 x 2.8 cm soft tissue
mass is seen adjacent to left ovary which demonstrates flow on
Doppler.

Pulsed Doppler evaluation of both ovaries demonstrates normal
low-resistance arterial and venous waveforms.

Other findings

Small amount of free fluid is noted which most likely is
physiologic.
IMPRESSION: Intrauterine device is noted.

4.1 x 3.3 x 2.8 cm soft tissue mass is seen adjacent to left ovary
which demonstrates flow on Doppler. This is concerning for possible
neoplasm. Further evaluation with MRI scan with contrast
administration is recommended.

## 2018-02-10 DIAGNOSIS — J0101 Acute recurrent maxillary sinusitis: Secondary | ICD-10-CM | POA: Diagnosis not present

## 2018-02-10 DIAGNOSIS — J342 Deviated nasal septum: Secondary | ICD-10-CM | POA: Diagnosis not present

## 2018-02-10 DIAGNOSIS — H6123 Impacted cerumen, bilateral: Secondary | ICD-10-CM | POA: Diagnosis not present

## 2018-02-10 DIAGNOSIS — F4310 Post-traumatic stress disorder, unspecified: Secondary | ICD-10-CM | POA: Diagnosis not present

## 2018-02-10 MED FILL — DULoxetine HCL 60 MG CPEP: 60 | 90 days supply | Qty: 90 | Fill #0

## 2018-02-10 MED FILL — ALPRAZolam 0.25 MG TABS: 0.25 | 30 days supply | Qty: 90 | Fill #2

## 2018-02-10 MED FILL — busPIRone HCL 30 MG TABS: 30 | 30 days supply | Qty: 60 | Fill #0

## 2018-02-10 MED FILL — GABAPENTIN 600 MG TAB: 600 | 90 days supply | Qty: 90 | Fill #0

## 2018-02-10 MED FILL — DOXAZOSIN MESYLATE 4 MG TAB: 4 | 90 days supply | Qty: 90 | Fill #0

## 2018-07-16 ENCOUNTER — Encounter: Payer: Self-pay | Admitting: Emergency Medicine

## 2018-07-16 DIAGNOSIS — F411 Generalized anxiety disorder: Secondary | ICD-10-CM

## 2018-07-16 DIAGNOSIS — F329 Major depressive disorder, single episode, unspecified: Secondary | ICD-10-CM | POA: Insufficient documentation

## 2018-07-16 DIAGNOSIS — F4310 Post-traumatic stress disorder, unspecified: Secondary | ICD-10-CM | POA: Insufficient documentation

## 2018-07-16 DIAGNOSIS — F514 Sleep terrors [night terrors]: Secondary | ICD-10-CM

## 2018-07-16 DIAGNOSIS — F322 Major depressive disorder, single episode, severe without psychotic features: Secondary | ICD-10-CM

## 2018-07-28 ENCOUNTER — Ambulatory Visit: Payer: Self-pay | Admitting: Physician Assistant

## 2018-08-22 ENCOUNTER — Other Ambulatory Visit: Payer: Self-pay | Admitting: Physician Assistant

## 2018-09-25 ENCOUNTER — Telehealth: Payer: Self-pay

## 2018-09-25 ENCOUNTER — Other Ambulatory Visit: Payer: Self-pay

## 2018-09-25 MED ORDER — BUSPIRONE HCL 30 MG PO TABS: 30.0000 mg | ORAL_TABLET | Freq: Two times a day (BID) | ORAL | 0 refills | Status: DC

## 2018-09-25 MED ORDER — ALPRAZOLAM 0.5 MG PO TABS: ORAL_TABLET | ORAL | 0 refills | Status: DC

## 2018-09-25 MED ORDER — DOXAZOSIN MESYLATE 4 MG PO TABS: 4.0000 mg | ORAL_TABLET | Freq: Every day | ORAL | 0 refills | Status: DC

## 2018-09-25 MED ORDER — DULOXETINE HCL 60 MG PO CPEP: 60.0000 mg | ORAL_CAPSULE | ORAL | 0 refills | Status: DC

## 2018-09-25 NOTE — Telephone Encounter (Signed)
Refill request for Xanax 0.5 mg 1 bid prn #75 cymbalta 60 mg 1 every am #90 buspar 30 mg 1 bid #180 Doxazosin 4mg  1 at hs #90  [facility] [address] [place]  Next OV 10/02/2018

## 2018-10-02 ENCOUNTER — Ambulatory Visit: Payer: BLUE CROSS/BLUE SHIELD | Admitting: Physician Assistant

## 2018-10-02 ENCOUNTER — Encounter: Payer: Self-pay | Admitting: Physician Assistant

## 2018-10-02 VITALS — BP 122/76 | HR 95

## 2018-10-02 DIAGNOSIS — F411 Generalized anxiety disorder: Secondary | ICD-10-CM | POA: Diagnosis not present

## 2018-10-02 DIAGNOSIS — F331 Major depressive disorder, recurrent, moderate: Secondary | ICD-10-CM | POA: Diagnosis not present

## 2018-10-02 DIAGNOSIS — F4310 Post-traumatic stress disorder, unspecified: Secondary | ICD-10-CM

## 2018-10-02 DIAGNOSIS — G2581 Restless legs syndrome: Secondary | ICD-10-CM | POA: Diagnosis not present

## 2018-10-02 MED ORDER — HYDROXYZINE HCL 25 MG PO TABS: 25.0000 mg | ORAL_TABLET | Freq: Three times a day (TID) | ORAL | 5 refills | Status: DC | PRN

## 2018-10-02 MED ORDER — ALPRAZOLAM 0.5 MG PO TABS: ORAL_TABLET | ORAL | 5 refills | Status: DC

## 2018-10-02 MED ORDER — BUSPIRONE HCL 30 MG PO TABS: 30.0000 mg | ORAL_TABLET | Freq: Two times a day (BID) | ORAL | 5 refills | Status: DC

## 2018-10-02 MED ORDER — DULOXETINE HCL 60 MG PO CPEP: 60.0000 mg | ORAL_CAPSULE | ORAL | 5 refills | Status: DC

## 2018-10-02 MED ORDER — DOXAZOSIN MESYLATE 2 MG PO TABS: 2.0000 mg | ORAL_TABLET | Freq: Every day | ORAL | 5 refills | Status: DC

## 2018-10-02 MED ORDER — GABAPENTIN 600 MG PO TABS: 600.0000 mg | ORAL_TABLET | Freq: Every day | ORAL | 5 refills | Status: DC
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
## 2018-12-10 ENCOUNTER — Telehealth: Payer: Self-pay

## 2018-12-10 NOTE — Telephone Encounter (Signed)
Covid-19 travel screening questions  Have you traveled in the last 14 days?  If yes where?  Do you now or have you had a fever in the last 14 days?  Do you have any respiratory symptoms of shortness of breath or cough now or in the last 14 days?  Do you have a medical history of Congestive Heart Failure?  Do you have a medical history of lung disease?  Do you have any family members or close contacts with diagnosed or suspected Covid-19?   Left a message with the questions. Instructioned to call if she answers YES to any of them.

## 2018-12-11 ENCOUNTER — Ambulatory Visit: Payer: 59 | Admitting: Physician Assistant

## 2018-12-27 ENCOUNTER — Telehealth (INDEPENDENT_AMBULATORY_CARE_PROVIDER_SITE_OTHER): Payer: Self-pay | Admitting: Physician Assistant

## 2018-12-27 DIAGNOSIS — R05 Cough: Secondary | ICD-10-CM

## 2018-12-27 DIAGNOSIS — R059 Cough, unspecified: Secondary | ICD-10-CM

## 2018-12-27 MED ORDER — PROMETHAZINE-DM 6.25-15 MG/5ML PO SYRP: 5.0000 mL | ORAL_SOLUTION | Freq: Four times a day (QID) | ORAL | 0 refills | Status: AC | PRN

## 2018-12-27 NOTE — Progress Notes (Signed)

## 2019-04-02 ENCOUNTER — Ambulatory Visit: Payer: BLUE CROSS/BLUE SHIELD | Admitting: Physician Assistant

## 2019-04-23 ENCOUNTER — Other Ambulatory Visit: Payer: Self-pay

## 2019-04-23 ENCOUNTER — Telehealth: Payer: Self-pay | Admitting: Physician Assistant

## 2019-04-23 MED ORDER — ALPRAZOLAM 0.5 MG PO TABS: ORAL_TABLET | ORAL | 5 refills | Status: DC

## 2019-04-23 NOTE — Telephone Encounter (Signed)
Pended for approval.

## 2019-04-23 NOTE — Telephone Encounter (Signed)
Patient need refill on Xanax to be sent to [facility] in [place], pt scheduled appointment for 09/11

## 2019-06-04 ENCOUNTER — Ambulatory Visit: Payer: BLUE CROSS/BLUE SHIELD | Admitting: Physician Assistant

## 2019-10-11 ENCOUNTER — Telehealth: Payer: Self-pay | Admitting: Physician Assistant

## 2019-10-11 ENCOUNTER — Other Ambulatory Visit: Payer: Self-pay | Admitting: Physician Assistant

## 2019-10-11 MED ORDER — HYDROXYZINE HCL 25 MG PO TABS: 25.0000 mg | ORAL_TABLET | Freq: Three times a day (TID) | ORAL | 1 refills | Status: DC | PRN

## 2019-10-11 MED ORDER — ALPRAZOLAM 0.5 MG PO TABS: ORAL_TABLET | ORAL | 0 refills | Status: DC

## 2019-10-11 NOTE — Telephone Encounter (Signed)
Pt called to request 1 month refill for xanax & hydroxyzine @ [facility] on file. Next appt 2/12 and on canc list

## 2019-10-11 NOTE — Telephone Encounter (Signed)
Rx sent 

## 2019-11-05 ENCOUNTER — Ambulatory Visit (INDEPENDENT_AMBULATORY_CARE_PROVIDER_SITE_OTHER): Payer: BLUE CROSS/BLUE SHIELD | Admitting: Physician Assistant

## 2019-11-05 ENCOUNTER — Encounter: Payer: Self-pay | Admitting: Physician Assistant

## 2019-11-05 DIAGNOSIS — F331 Major depressive disorder, recurrent, moderate: Secondary | ICD-10-CM | POA: Diagnosis not present

## 2019-11-05 DIAGNOSIS — F411 Generalized anxiety disorder: Secondary | ICD-10-CM

## 2019-11-05 DIAGNOSIS — F4310 Post-traumatic stress disorder, unspecified: Secondary | ICD-10-CM | POA: Diagnosis not present

## 2019-11-05 DIAGNOSIS — G2581 Restless legs syndrome: Secondary | ICD-10-CM | POA: Diagnosis not present

## 2019-11-05 MED ORDER — GABAPENTIN 600 MG PO TABS: 600.0000 mg | ORAL_TABLET | Freq: Every day | ORAL | 5 refills | Status: DC

## 2019-11-05 MED ORDER — DOXAZOSIN MESYLATE 2 MG PO TABS: 2.0000 mg | ORAL_TABLET | Freq: Every day | ORAL | 5 refills | Status: DC

## 2019-11-05 MED ORDER — DULOXETINE HCL 60 MG PO CPEP: 60.0000 mg | ORAL_CAPSULE | ORAL | 5 refills | Status: DC

## 2019-11-05 MED ORDER — ALPRAZOLAM 0.5 MG PO TABS: ORAL_TABLET | ORAL | 5 refills | Status: DC

## 2019-11-05 MED ORDER — BUSPIRONE HCL 30 MG PO TABS: 30.0000 mg | ORAL_TABLET | Freq: Two times a day (BID) | ORAL | 5 refills | Status: DC

## 2019-11-05 MED ORDER — HYDROXYZINE HCL 25 MG PO TABS: 25.0000 mg | ORAL_TABLET | Freq: Three times a day (TID) | ORAL | 5 refills | Status: DC | PRN
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
## 2019-12-17 ENCOUNTER — Ambulatory Visit (INDEPENDENT_AMBULATORY_CARE_PROVIDER_SITE_OTHER): Payer: BLUE CROSS/BLUE SHIELD | Admitting: Physician Assistant

## 2019-12-17 ENCOUNTER — Encounter: Payer: Self-pay | Admitting: Physician Assistant

## 2019-12-17 DIAGNOSIS — F331 Major depressive disorder, recurrent, moderate: Secondary | ICD-10-CM | POA: Diagnosis not present

## 2019-12-17 DIAGNOSIS — F411 Generalized anxiety disorder: Secondary | ICD-10-CM

## 2019-12-17 DIAGNOSIS — F4310 Post-traumatic stress disorder, unspecified: Secondary | ICD-10-CM | POA: Diagnosis not present

## 2019-12-17 DIAGNOSIS — G2581 Restless legs syndrome: Secondary | ICD-10-CM

## 2019-12-17 MED ORDER — DULOXETINE HCL 30 MG PO CPEP: 30.0000 mg | ORAL_CAPSULE | Freq: Every day | ORAL | 1 refills | Status: DC

## 2019-12-17 MED ORDER — ALPRAZOLAM 1 MG PO TABS: 1.0000 mg | ORAL_TABLET | Freq: Two times a day (BID) | ORAL | 1 refills | Status: DC | PRN
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
## 2020-02-03 ENCOUNTER — Emergency Department (HOSPITAL_COMMUNITY)
Admission: EM | Admit: 2020-02-03 | Discharge: 2020-02-03 | Disposition: A | Discharge status: Home / Self Care | Payer: BLUE CROSS/BLUE SHIELD | Attending: Emergency Medicine | Admitting: Emergency Medicine

## 2020-02-03 ENCOUNTER — Encounter (HOSPITAL_COMMUNITY): Payer: Self-pay | Admitting: Pediatrics

## 2020-02-03 ENCOUNTER — Other Ambulatory Visit: Payer: Self-pay

## 2020-02-03 ENCOUNTER — Emergency Department (HOSPITAL_COMMUNITY): Payer: BLUE CROSS/BLUE SHIELD

## 2020-02-03 DIAGNOSIS — R112 Nausea with vomiting, unspecified: Secondary | ICD-10-CM | POA: Insufficient documentation

## 2020-02-03 DIAGNOSIS — N730 Acute parametritis and pelvic cellulitis: Secondary | ICD-10-CM

## 2020-02-03 DIAGNOSIS — Z975 Presence of (intrauterine) contraceptive device: Secondary | ICD-10-CM | POA: Diagnosis not present

## 2020-02-03 DIAGNOSIS — N739 Female pelvic inflammatory disease, unspecified: Secondary | ICD-10-CM | POA: Insufficient documentation

## 2020-02-03 DIAGNOSIS — E282 Polycystic ovarian syndrome: Secondary | ICD-10-CM | POA: Diagnosis not present

## 2020-02-03 DIAGNOSIS — R1031 Right lower quadrant pain: Secondary | ICD-10-CM

## 2020-02-03 LAB — COMPREHENSIVE METABOLIC PANEL
ALT: 36 U/L (ref 0–44)
AST: 29 U/L (ref 15–41)
Albumin: 3.7 g/dL (ref 3.5–5.0)
Alkaline Phosphatase: 73 U/L (ref 38–126)
Anion gap: 10 (ref 5–15)
BUN: 14 mg/dL (ref 6–20)
CO2: 22 mmol/L (ref 22–32)
Calcium: 8.6 mg/dL — ABNORMAL LOW (ref 8.9–10.3)
Chloride: 108 mmol/L (ref 98–111)
Creatinine, Ser: 0.96 mg/dL (ref 0.44–1.00)
GFR calc Af Amer: >60 mL/min (ref >60)
GFR calc non Af Amer: >60 mL/min (ref >60)
Glucose, Bld: 111 mg/dL — ABNORMAL HIGH (ref 70–99)
Potassium: 4.3 mmol/L (ref 3.5–5.1)
Sodium: 140 mmol/L (ref 135–145)
Total Bilirubin: 0.5 mg/dL (ref 0.3–1.2)
Total Protein: 6.5 g/dL (ref 6.5–8.1)

## 2020-02-03 LAB — CBC
HCT: 47.9 % — ABNORMAL HIGH (ref 36.0–46.0)
Hemoglobin: 15.8 g/dL — ABNORMAL HIGH (ref 12.0–15.0)
MCH: 30.9 pg (ref 26.0–34.0)
MCHC: 33 g/dL (ref 30.0–36.0)
MCV: 93.7 fL (ref 80.0–100.0)
Platelets: 261 10*3/uL (ref 150–400)
RBC: 5.11 MIL/uL (ref 3.87–5.11)
RDW: 11.7 % (ref 11.5–15.5)
WBC: 14.9 10*3/uL — ABNORMAL HIGH (ref 4.0–10.5)
nRBC: 0 % (ref 0.0–0.2)

## 2020-02-03 LAB — URINALYSIS, ROUTINE W REFLEX MICROSCOPIC
Bilirubin Urine: NEGATIVE
Glucose, UA: NEGATIVE mg/dL
Hgb urine dipstick: NEGATIVE
Ketones, ur: NEGATIVE mg/dL
Leukocytes,Ua: NEGATIVE
Nitrite: NEGATIVE
Protein, ur: NEGATIVE mg/dL
Specific Gravity, Urine: 1.02 (ref 1.005–1.030)
pH: 5 (ref 5.0–8.0)

## 2020-02-03 LAB — I-STAT BETA HCG BLOOD, ED (MC, WL, AP ONLY): I-stat hCG, quantitative: <5 m[IU]/mL (ref <5)

## 2020-02-03 LAB — LIPASE, BLOOD: Lipase: 25 U/L (ref 11–51)

## 2020-02-03 MED ORDER — ONDANSETRON HCL 4 MG/2ML IJ SOLN
4.0000 mg | Freq: Once | INTRAMUSCULAR | Status: AC
  Administered 2020-02-03: 4 mg via INTRAVENOUS
  Filled 2020-02-03: qty 2

## 2020-02-03 MED ORDER — HYDROCODONE-ACETAMINOPHEN 5-325 MG PO TABS: 1.0000 | ORAL_TABLET | Freq: Three times a day (TID) | ORAL | 0 refills | Status: DC | PRN

## 2020-02-03 MED ORDER — MORPHINE SULFATE (PF) 4 MG/ML IV SOLN
4.0000 mg | Freq: Once | INTRAVENOUS | Status: AC
  Administered 2020-02-03: 4 mg via INTRAVENOUS
  Filled 2020-02-03: qty 1

## 2020-02-03 MED ORDER — SODIUM CHLORIDE 0.9% FLUSH: 3.0000 mL | Freq: Once | INTRAVENOUS | Status: DC

## 2020-02-03 MED ORDER — ONDANSETRON 4 MG PO TBDP
4.0000 mg | ORAL_TABLET | Freq: Three times a day (TID) | ORAL | 0 refills | Status: DC | PRN
Start: 2020-02-03 — End: 2022-05-28

## 2020-02-03 MED ORDER — SODIUM CHLORIDE 0.9 % IV BOLUS
1000.0000 mL | Freq: Once | INTRAVENOUS | Status: AC
  Administered 2020-02-03: 1000 mL via INTRAVENOUS

## 2020-02-03 NOTE — ED Provider Notes (Signed)
[facility] EMERGENCY DEPARTMENT Provider Note   CSN: [id number] Arrival date & time: 02/03/20  1728     History Chief Complaint  Patient presents with  . Abdominal Pain    [name] is a 34 y.o. female.  HPI  Is a 34 year old female with past medical history significant for PCOS, hemorrhagic cyst rupture, endometriosis.  She is a past surgical history significant for appendectomy and diagnostic laparotomy to determine her endometriosis.  She is seen by Dr. Hoover Browns who is her OB/GYN who saw her in clinic today for intermittent severe right lower quadrant abdominal/pelvic pain that began Tuesday and has been intermittent since.  She states that she was originally seen and treated for urinary tract infection however as her symptoms have worsened she was sent by her OB/GYN for evaluation with ultrasound to rule out tubo-ovarian abscess/complications of PID.  My attending physician Dr. Jeraldine Loots received a phone call from patient's OB/GYN.  Patient has cervical motion tenderness on her exam.  Patient states her pain is pelvic, intermittent, 10/10, sharp crampy achy.  Does not seem to have any significant aggravating or mitigating factors.  No radiation of pain.  Associated nausea, she is vomited once which is nonbloody nonbilious.  She states she has been able to keep down fluids with Zofran at home.  Any urinary symptoms such as dysuria, frequency, urgency.  She states she has been peeing somewhat less but also has had less intake p.o. because of her nausea and pain.  She states she has had normal bowel movements with no diarrhea or constipation.  She also denies any vaginal discharge and states that she is sexually active with one partner who she is in the monogamous relationship with.     Past Medical History:  Diagnosis Date  . Allergy    seasonal  . Anxiety   . Aspergillosis (HCC)    sinus  . Chronic idiopathic urticaria   . Complication of anesthesia      prolonged sedation, could not breath after tube removal from sinus surgery, required breathing treatment and was admitted for overnight observation  . Dermatographism   . Duodenitis   . GERD (gastroesophageal reflux disease)   . Herpes   . PCOS (polycystic ovarian syndrome)   . PONV (postoperative nausea and vomiting)    vomiting after tonsillectomy, 2008    Patient Active Problem List   Diagnosis Date Noted  . PTSD (post-traumatic stress disorder) 07/16/2018  . MDD (major depressive disorder) 07/16/2018  . Night terrors 07/16/2018  . Anxiety state 07/16/2018  . Acute appendicitis 08/16/2017  . Acute appendicitis with localized peritonitis 08/16/2017  . Cough 03/23/2015    Past Surgical History:  Procedure Laterality Date  . APPENDECTOMY    . CERVICAL CERCLAGE    . ESOPHAGOGASTRODUODENOSCOPY    . KNEE SURGERY Right 2007  . LAPAROSCOPIC APPENDECTOMY N/A 08/16/2017   Procedure: APPENDECTOMY LAPAROSCOPIC;  Surgeon: Glenna Fellows, MD;  Location: [facility];  Service: General;  Laterality: N/A;  . LAPAROSCOPIC LYSIS OF ADHESIONS N/A 10/31/2016   Procedure: LAPAROSCOPIC LYSIS OF ADHESIONS;  Surgeon: Hoover Browns, MD;  Location: [facility];  Service: Gynecology;  Laterality: N/A;  . LAPAROSCOPY N/A 10/31/2016   Procedure: LAPAROSCOPY DIAGNOSTIC;  Surgeon: Hoover Browns, MD;  Location: [facility];  Service: Gynecology;  Laterality: N/A;  . NASAL SINUS SURGERY    . TONSILLECTOMY  2008  . WISDOM TOOTH EXTRACTION       OB History    Gravida  1  Para  1   Term      Preterm  1   AB      Living  1     SAB      TAB      Ectopic      Multiple      Live Births  1           Family History  Problem Relation Age of Onset  . Allergies Sister   . Allergies Mother   . Asthma Mother   . Allergies Father     Social History   Tobacco Use  . Smoking status: Former Smoker    Packs/day: 1.00    Years: 1.00    Pack years: 1.00    Types: Cigarettes    Quit date: 05/24/2016     Years since quitting: 3.6  . Smokeless tobacco: Never Used  Substance Use Topics  . Alcohol use: No    Alcohol/week: 0.0 standard drinks  . Drug use: No    Home Medications Prior to Admission medications   Medication Sig Start Date End Date Taking? Authorizing Provider  ALPRAZolam Prudy Feeler) 1 MG tablet Take 1 tablet (1 mg total) by mouth 2 (two) times daily as needed for anxiety. 12/17/19   Melony Overly T, PA-C  busPIRone (BUSPAR) 30 MG tablet Take 1 tablet (30 mg total) by mouth 2 (two) times daily. 11/05/19   Melony Overly T, PA-C  cyclobenzaprine (FLEXERIL) 5 MG tablet Take 1 tablet (5 mg total) by mouth 2 (two) times daily as needed for muscle spasms. Patient not taking: Reported on 10/02/2018 11/15/17   Linus Mako B, NP  doxazosin (CARDURA) 2 MG tablet Take 1 tablet (2 mg total) by mouth daily. 11/05/19   Melony Overly T, PA-C  DULoxetine (CYMBALTA) 30 MG capsule Take 1 capsule (30 mg total) by mouth daily. Take w/ 60 mg=90mg  12/17/19   Melony Overly T, PA-C  DULoxetine (CYMBALTA) 60 MG capsule Take 1 capsule (60 mg total) by mouth every morning. 11/05/19   Melony Overly T, PA-C  EPINEPHrine (EPIPEN IJ) Inject 1 each as directed once.    [provider]  gabapentin (NEURONTIN) 600 MG tablet Take 1 tablet (600 mg total) by mouth at bedtime. 11/05/19   Cherie Ouch, PA-C  HYDROcodone-acetaminophen (NORCO/VICODIN) 5-325 MG tablet Take 1 tablet by mouth every 8 (eight) hours as needed for severe pain. 02/03/20   Gailen Shelter, PA  hydrOXYzine (ATARAX/VISTARIL) 25 MG tablet Take 1-2 tablets (25-50 mg total) by mouth every 8 (eight) hours as needed. 11/05/19   Cherie Ouch, PA-C  ibuprofen (ADVIL,MOTRIN) 800 MG tablet Take 1 tablet (800 mg total) by mouth every 8 (eight) hours as needed for moderate pain or cramping. 10/31/16   Hoover Browns, MD  metFORMIN (GLUCOPHAGE) 500 MG tablet Take 500 mg by mouth daily. 10/01/16   [provider]  ondansetron (ZOFRAN ODT) 4 MG  disintegrating tablet Take 1 tablet (4 mg total) by mouth every 8 (eight) hours as needed for nausea or vomiting. 02/03/20   Gailen Shelter, PA  PARAGARD INTRAUTERINE COPPER IUD IUD 1 each by Intrauterine route once.    [provider]    Allergies    Penicillins, Shrimp [shellfish allergy], Eggs or egg-derived products, Soy allergy, and Wheat bran  Review of Systems   Review of Systems  Constitutional: Negative for fever.  HENT: Negative for congestion.   Respiratory: Negative for shortness of breath.   Cardiovascular: Negative for chest  pain.  Gastrointestinal: Positive for abdominal pain, nausea and vomiting. Negative for abdominal distention.  Genitourinary: Positive for pelvic pain. Negative for decreased urine volume, dysuria, urgency and vaginal discharge.  Neurological: Negative for dizziness and headaches.    Physical Exam Updated Vital Signs BP 114/69   Pulse 72   Temp 98.4 F (36.9 C) (Oral)   Resp 20   Ht 5\' 4"  (1.626 m)   Wt 76.2 kg   SpO2 99%   BMI 28.84 kg/m   Physical Exam Vitals and nursing note reviewed.  Constitutional:      General: She is in acute distress.     Appearance: She is normal weight.     Comments: Patient is 34 year old female, pleasant, able answer questions appropriately follow commands.  She does appear to be acutely uncomfortable.  HENT:     Head: Normocephalic and atraumatic.     Nose: Nose normal.  Eyes:     General: No scleral icterus. Cardiovascular:     Rate and Rhythm: Normal rate and regular rhythm.     Pulses: Normal pulses.     Heart sounds: Normal heart sounds.  Pulmonary:     Effort: Pulmonary effort is normal. No respiratory distress.     Breath sounds: No wheezing.  Abdominal:     General: Bowel sounds are normal.     Palpations: Abdomen is soft.     Tenderness: There is abdominal tenderness in the right lower quadrant. There is no right CVA tenderness or left CVA tenderness.     Hernia: No hernia is  present.  Genitourinary:    Comments: GU exam deferred as patient has already had this done today. Per OB she has CMT.  Musculoskeletal:     Cervical back: Normal range of motion.     Right lower leg: No edema.     Left lower leg: No edema.  Skin:    General: Skin is warm and dry.     Capillary Refill: Capillary refill takes less than 2 seconds.  Neurological:     Mental Status: She is alert. Mental status is at baseline.  Psychiatric:        Mood and Affect: Mood normal.        Behavior: Behavior normal.     ED Results / Procedures / Treatments   Labs (all labs ordered are listed, but only abnormal results are displayed) Labs Reviewed  COMPREHENSIVE METABOLIC PANEL - Abnormal; Notable for the following components:      Result Value   Glucose, Bld 111 (*)    Calcium 8.6 (*)    All other components within normal limits  CBC - Abnormal; Notable for the following components:   WBC 14.9 (*)    Hemoglobin 15.8 (*)    HCT 47.9 (*)    All other components within normal limits  URINALYSIS, ROUTINE W REFLEX MICROSCOPIC - Abnormal; Notable for the following components:   Color, Urine AMBER (*)    All other components within normal limits  LIPASE, BLOOD  I-STAT BETA HCG BLOOD, ED ([facility], [facility], [facility] ONLY)    EKG None  Radiology 38 Transvaginal Non-OB  Result Date: 02/03/2020 CLINICAL DATA:  Initial evaluation for acute adnexal pain. EXAM: TRANSABDOMINAL AND TRANSVAGINAL ULTRASOUND OF PELVIS DOPPLER ULTRASOUND OF OVARIES TECHNIQUE: Both transabdominal and transvaginal ultrasound examinations of the pelvis were performed. Transabdominal technique was performed for global imaging of the pelvis including uterus, ovaries, adnexal regions, and pelvic cul-de-sac. It was necessary to proceed with endovaginal exam following  the transabdominal exam to visualize the uterus, endometrium, and ovaries. Color and duplex Doppler ultrasound was utilized to evaluate blood flow to the ovaries. COMPARISON:   Prior ultrasound from 01/06/2018. FINDINGS: Uterus Measurements: 7.2 x 4.3 x 4.7 cm = volume: 77.5 mL. No fibroids or other mass visualized. Nabothian cyst noted at the cervix. Endometrium Thickness: 5 mm. No focal abnormality visualized. IUD in appropriate position within the endometrial cavity. Right ovary Measurements: 3.3 x 2.1 x 2.7 cm = volume: 9.9 mL. Normal appearance/no adnexal mass. Left ovary Measurements: 3.6 x 2.4 x 3.2 cm = volume: 14.3 mL. Normal appearance/no adnexal mass. Pulsed Doppler evaluation of both ovaries demonstrates normal low-resistance arterial and venous waveforms. Other findings Trace free physiologic fluid present within the pelvis. IMPRESSION: 1. Normal pelvic ultrasound. No evidence for ovarian torsion or other acute abnormality. 2. IUD in appropriate position within the endometrial cavity. Electronically Signed   By: Jeannine Boga M.D.   On: 02/03/2020 20:09   US Pelvis Complete  Result Date: 02/03/2020 CLINICAL DATA:  Initial evaluation for acute adnexal pain. EXAM: TRANSABDOMINAL AND TRANSVAGINAL ULTRASOUND OF PELVIS DOPPLER ULTRASOUND OF OVARIES TECHNIQUE: Both transabdominal and transvaginal ultrasound examinations of the pelvis were performed. Transabdominal technique was performed for global imaging of the pelvis including uterus, ovaries, adnexal regions, and pelvic cul-de-sac. It was necessary to proceed with endovaginal exam following the transabdominal exam to visualize the uterus, endometrium, and ovaries. Color and duplex Doppler ultrasound was utilized to evaluate blood flow to the ovaries. COMPARISON:  Prior ultrasound from 01/06/2018. FINDINGS: Uterus Measurements: 7.2 x 4.3 x 4.7 cm = volume: 77.5 mL. No fibroids or other mass visualized. Nabothian cyst noted at the cervix. Endometrium Thickness: 5 mm. No focal abnormality visualized. IUD in appropriate position within the endometrial cavity. Right ovary Measurements: 3.3 x 2.1 x 2.7 cm = volume: 9.9 mL.  Normal appearance/no adnexal mass. Left ovary Measurements: 3.6 x 2.4 x 3.2 cm = volume: 14.3 mL. Normal appearance/no adnexal mass. Pulsed Doppler evaluation of both ovaries demonstrates normal low-resistance arterial and venous waveforms. Other findings Trace free physiologic fluid present within the pelvis. IMPRESSION: 1. Normal pelvic ultrasound. No evidence for ovarian torsion or other acute abnormality. 2. IUD in appropriate position within the endometrial cavity. Electronically Signed   By: Jeannine Boga M.D.   On: 02/03/2020 20:09   [place] Art/Ven Flow Abd Pelv Doppler  Result Date: 02/03/2020 CLINICAL DATA:  Initial evaluation for acute adnexal pain. EXAM: TRANSABDOMINAL AND TRANSVAGINAL ULTRASOUND OF PELVIS DOPPLER ULTRASOUND OF OVARIES TECHNIQUE: Both transabdominal and transvaginal ultrasound examinations of the pelvis were performed. Transabdominal technique was performed for global imaging of the pelvis including uterus, ovaries, adnexal regions, and pelvic cul-de-sac. It was necessary to proceed with endovaginal exam following the transabdominal exam to visualize the uterus, endometrium, and ovaries. Color and duplex Doppler ultrasound was utilized to evaluate blood flow to the ovaries. COMPARISON:  Prior ultrasound from 01/06/2018. FINDINGS: Uterus Measurements: 7.2 x 4.3 x 4.7 cm = volume: 77.5 mL. No fibroids or other mass visualized. Nabothian cyst noted at the cervix. Endometrium Thickness: 5 mm. No focal abnormality visualized. IUD in appropriate position within the endometrial cavity. Right ovary Measurements: 3.3 x 2.1 x 2.7 cm = volume: 9.9 mL. Normal appearance/no adnexal mass. Left ovary Measurements: 3.6 x 2.4 x 3.2 cm = volume: 14.3 mL. Normal appearance/no adnexal mass. Pulsed Doppler evaluation of both ovaries demonstrates normal low-resistance arterial and venous waveforms. Other findings Trace free physiologic fluid present within the pelvis. IMPRESSION:  1. Normal pelvic  ultrasound. No evidence for ovarian torsion or other acute abnormality. 2. IUD in appropriate position within the endometrial cavity. Electronically Signed   By: Rise MuBenjamin  McClintock M.D.   On: 02/03/2020 20:09    Procedures Procedures (including critical care time)  Medications Ordered in ED Medications  sodium chloride flush (NS) 0.9 % injection 3 mL (has no administration in time range)  morphine 4 MG/ML injection 4 mg (4 mg Intravenous Given 02/03/20 1900)  sodium chloride 0.9 % bolus 1,000 mL (1,000 mLs Intravenous New Bag/Given 02/03/20 1906)  ondansetron (ZOFRAN) injection 4 mg (4 mg Intravenous Given 02/03/20 1900)  morphine 4 MG/ML injection 4 mg (4 mg Intravenous Given 02/03/20 2030)    ED Course  I have reviewed the triage vital signs and the nursing notes.  Pertinent labs & imaging results that were available during my care of the patient were reviewed by me and considered in my medical decision making (see chart for details).  Patient is a 34 year old female s/p appendectomy years ago with right lower quadrant abdominal pain he was found to have CMT on exam by her OB/GYN and discharged with PID antibiotics however has had continued pain today and was sent via phone to ED for ultrasound to evaluate for TOA or complication of PID.  My exam is notable for some right lower quadrant tenderness.  She has no appendix present after it was surgically removed years ago.  Ultrasound will also rule out torsion.  She is with negative blood hCG pregnancy therefore doubt ectopic.  Lipase within normal limits doubt pancreatitis.  She has no epigastric or other pain.  She has no right upper quadrant pain that would indicate [employer] Curtis.  Clinical Course as of Feb 02 2046  Thu Feb 03, 2020  2043 CMP without acute abnormality.  CBC with mild leukocytosis of 16, hemoglobin mildly elevated likely hemoconcentrated.  This may be somewhat elevating the patient's leukocytosis.   [WF]  2044  Urinalysis without any acute abnormality.   [WF]  2044 Ultrasound of pelvis, transvaginal, rule out torsion protocol showed normal ultrasound.  No acute abnormality.   [WF]  2045 Patient does continue to have some abdominal pain although it significantly improved after morphine it did worsen somewhat after the pelvic ultrasound.  Her pain is gradually improving and she states that it is improving more stiffly now that she has received a second dose of morphine.  Husband is at bedside.   [WF]  2045 With my attending physician Dr. Jeraldine LootsLockwood.  Plan is to discharge patient with her continued doxycycline use which has been prescribed by her OB/GYN.  I will provide her with Zofran as well as Percocet for pain.   [WF]  2045 She is sent with a copy of her ultrasound results.   [WF]  2045 Dr. Jeraldine LootsLockwood and I discussed case with Dr. Sallye OberKulwa who will see patient either tomorrow or Monday for follow-up.  Her office will reach out to patient to discuss her condition tomorrow.   [WF]    Clinical Course User Index [WF] Gailen ShelterFondaw, Tyreesha Maharaj S, [place]   MDM Rules/Calculators/A&P                       I discussed this case with my attending physician who cosigned this note including patient's presenting symptoms, physical exam, and planned diagnostics and interventions. Attending physician stated agreement with plan or made changes to plan which were implemented.   Attending physician assessed patient at  bedside.  We will discharge patient at this time she is already on PID treatment, will provide her with Zofran and pain medication.  She is tolerating p.o. at this time states that her pain is improving.  Repeat abdominal exam she does have some continued right lower quadrant tenderness but no guarding, rebound and no CVA tenderness.  She is well-appearing at this time.  Vital signs are within normal limits.  Will discharge.  She was given strict return precautions.  Final Clinical Impression(s) / ED Diagnoses Final  diagnoses:  Right lower quadrant abdominal pain  PID (acute pelvic inflammatory disease)    Rx / DC Orders ED Discharge Orders         Ordered    ondansetron (ZOFRAN ODT) 4 MG disintegrating tablet  Every 8 hours PRN     02/03/20 2043    HYDROcodone-acetaminophen (NORCO/VICODIN) 5-325 MG tablet  Every 8 hours PRN     02/03/20 2043           Gailen Shelter, [place] 02/03/20 2048    Gerhard Munch, MD 02/03/20 2140

## 2020-02-03 NOTE — ED Triage Notes (Signed)
Patient c/o severe intermittent pain since Tuesday. Patient stated she was seen by her OB Gyn and was dx w/ UTI; stated she received toradol and it didn't help.

## 2020-02-03 NOTE — Discharge Instructions (Addendum)
Please use Tylenol or ibuprofen for pain.  You may use 600 mg ibuprofen every 6 hours or 1000 mg of Tylenol every 6 hours.  You may choose to alternate between the 2.  This would be most effective.  Not to exceed 4 g of Tylenol within 24 hours.  Not to exceed 3200 mg ibuprofen 24 hours.  Use Percocet as needed for breakthrough pain.  Please use Zofran as needed for nausea.  Follow-up with your OB/GYN Dr. Sallye Ober

## 2020-02-12 ENCOUNTER — Other Ambulatory Visit: Payer: Self-pay | Admitting: Physician Assistant

## 2020-02-14 ENCOUNTER — Observation Stay (HOSPITAL_COMMUNITY)
Admission: AD | Admit: 2020-02-14 | Discharge: 2020-02-15 | Disposition: A | Discharge status: Home / Self Care | Payer: BLUE CROSS/BLUE SHIELD | Source: Ambulatory Visit | Attending: Obstetrics & Gynecology | Admitting: Obstetrics & Gynecology

## 2020-02-14 ENCOUNTER — Other Ambulatory Visit: Payer: Self-pay

## 2020-02-14 ENCOUNTER — Observation Stay (HOSPITAL_COMMUNITY): Payer: BLUE CROSS/BLUE SHIELD

## 2020-02-14 ENCOUNTER — Other Ambulatory Visit: Payer: Self-pay | Admitting: Obstetrics & Gynecology

## 2020-02-14 DIAGNOSIS — N739 Female pelvic inflammatory disease, unspecified: Principal | ICD-10-CM | POA: Insufficient documentation

## 2020-02-14 DIAGNOSIS — Z791 Long term (current) use of non-steroidal anti-inflammatories (NSAID): Secondary | ICD-10-CM | POA: Diagnosis not present

## 2020-02-14 DIAGNOSIS — Z8719 Personal history of other diseases of the digestive system: Secondary | ICD-10-CM | POA: Insufficient documentation

## 2020-02-14 DIAGNOSIS — Z91013 Allergy to seafood: Secondary | ICD-10-CM | POA: Insufficient documentation

## 2020-02-14 DIAGNOSIS — Z79899 Other long term (current) drug therapy: Secondary | ICD-10-CM | POA: Diagnosis not present

## 2020-02-14 DIAGNOSIS — Z880 Allergy status to penicillin: Secondary | ICD-10-CM | POA: Insufficient documentation

## 2020-02-14 DIAGNOSIS — E282 Polycystic ovarian syndrome: Secondary | ICD-10-CM | POA: Diagnosis not present

## 2020-02-14 DIAGNOSIS — Z825 Family history of asthma and other chronic lower respiratory diseases: Secondary | ICD-10-CM | POA: Insufficient documentation

## 2020-02-14 DIAGNOSIS — F419 Anxiety disorder, unspecified: Secondary | ICD-10-CM | POA: Diagnosis not present

## 2020-02-14 DIAGNOSIS — Z91018 Allergy to other foods: Secondary | ICD-10-CM | POA: Insufficient documentation

## 2020-02-14 DIAGNOSIS — Z87891 Personal history of nicotine dependence: Secondary | ICD-10-CM | POA: Diagnosis not present

## 2020-02-14 DIAGNOSIS — Z7984 Long term (current) use of oral hypoglycemic drugs: Secondary | ICD-10-CM | POA: Insufficient documentation

## 2020-02-14 DIAGNOSIS — Z881 Allergy status to other antibiotic agents status: Secondary | ICD-10-CM | POA: Diagnosis not present

## 2020-02-14 DIAGNOSIS — N730 Acute parametritis and pelvic cellulitis: Secondary | ICD-10-CM | POA: Diagnosis present

## 2020-02-14 DIAGNOSIS — L501 Idiopathic urticaria: Secondary | ICD-10-CM | POA: Diagnosis not present

## 2020-02-14 DIAGNOSIS — K219 Gastro-esophageal reflux disease without esophagitis: Secondary | ICD-10-CM | POA: Insufficient documentation

## 2020-02-14 DIAGNOSIS — Z888 Allergy status to other drugs, medicaments and biological substances status: Secondary | ICD-10-CM | POA: Insufficient documentation

## 2020-02-14 DIAGNOSIS — Z91012 Allergy to eggs: Secondary | ICD-10-CM | POA: Insufficient documentation

## 2020-02-14 DIAGNOSIS — Z20822 Contact with and (suspected) exposure to covid-19: Secondary | ICD-10-CM | POA: Insufficient documentation

## 2020-02-14 HISTORY — DX: Female pelvic inflammatory disease, unspecified: N73.9

## 2020-02-14 LAB — CBC WITH DIFFERENTIAL/PLATELET
Abs Immature Granulocytes: 0.04 10*3/uL (ref 0.00–0.07)
Basophils Absolute: 0.1 10*3/uL (ref 0.0–0.1)
Basophils Relative: 1 %
Eosinophils Absolute: 0.1 10*3/uL (ref 0.0–0.5)
Eosinophils Relative: 1 %
HCT: 42 % (ref 36.0–46.0)
Hemoglobin: 14.3 g/dL (ref 12.0–15.0)
Immature Granulocytes: 0 %
Lymphocytes Relative: 13 %
Lymphs Abs: 1.5 10*3/uL (ref 0.7–4.0)
MCH: 31.6 pg (ref 26.0–34.0)
MCHC: 34 g/dL (ref 30.0–36.0)
MCV: 92.9 fL (ref 80.0–100.0)
Monocytes Absolute: 0.5 10*3/uL (ref 0.1–1.0)
Monocytes Relative: 4 %
Neutro Abs: 9.9 10*3/uL — ABNORMAL HIGH (ref 1.7–7.7)
Neutrophils Relative %: 81 %
Platelets: 234 10*3/uL (ref 150–400)
RBC: 4.52 MIL/uL (ref 3.87–5.11)
RDW: 11.8 % (ref 11.5–15.5)
WBC: 12.1 10*3/uL — ABNORMAL HIGH (ref 4.0–10.5)
nRBC: 0 % (ref 0.0–0.2)

## 2020-02-14 LAB — COMPREHENSIVE METABOLIC PANEL
ALT: 46 U/L — ABNORMAL HIGH (ref 0–44)
AST: 29 U/L (ref 15–41)
Albumin: 3.6 g/dL (ref 3.5–5.0)
Alkaline Phosphatase: 73 U/L (ref 38–126)
Anion gap: 8 (ref 5–15)
BUN: 9 mg/dL (ref 6–20)
CO2: 24 mmol/L (ref 22–32)
Calcium: 8.3 mg/dL — ABNORMAL LOW (ref 8.9–10.3)
Chloride: 105 mmol/L (ref 98–111)
Creatinine, Ser: 0.84 mg/dL (ref 0.44–1.00)
GFR calc Af Amer: >60 mL/min (ref >60)
GFR calc non Af Amer: >60 mL/min (ref >60)
Glucose, Bld: 114 mg/dL — ABNORMAL HIGH (ref 70–99)
Potassium: 4 mmol/L (ref 3.5–5.1)
Sodium: 137 mmol/L (ref 135–145)
Total Bilirubin: 0.3 mg/dL (ref 0.3–1.2)
Total Protein: 6.1 g/dL — ABNORMAL LOW (ref 6.5–8.1)

## 2020-02-14 MED ORDER — KETOROLAC TROMETHAMINE 30 MG/ML IJ SOLN
30.0000 mg | Freq: Three times a day (TID) | INTRAMUSCULAR | Status: DC | PRN
  Administered 2020-02-15: 30 mg via INTRAVENOUS
  Filled 2020-02-14: qty 1

## 2020-02-14 MED ORDER — CLINDAMYCIN PHOSPHATE 900 MG/50ML IV SOLN
900.0000 mg | Freq: Three times a day (TID) | INTRAVENOUS | Status: DC
  Administered 2020-02-14 – 2020-02-15 (×3): 900 mg via INTRAVENOUS
  Filled 2020-02-14 (×5): qty 50

## 2020-02-14 MED ORDER — SODIUM CHLORIDE 0.9 % IV SOLN: INTRAVENOUS | Status: DC

## 2020-02-14 MED ORDER — IBUPROFEN 800 MG PO TABS
800.0000 mg | ORAL_TABLET | Freq: Three times a day (TID) | ORAL | Status: DC | PRN
  Administered 2020-02-14: 800 mg via ORAL
  Filled 2020-02-14: qty 1

## 2020-02-14 MED ORDER — HYDROCODONE-ACETAMINOPHEN 5-325 MG PO TABS
1.0000 | ORAL_TABLET | ORAL | Status: DC | PRN
  Administered 2020-02-14 – 2020-02-15 (×2): 2 via ORAL
  Filled 2020-02-14 (×2): qty 2

## 2020-02-14 MED ORDER — ONDANSETRON 4 MG PO TBDP: 8.0000 mg | ORAL_TABLET | Freq: Three times a day (TID) | ORAL | Status: DC | PRN

## 2020-02-14 MED ORDER — GENTAMICIN SULFATE 40 MG/ML IJ SOLN
5.0000 mg/kg | INTRAVENOUS | Status: DC
  Administered 2020-02-14 – 2020-02-15 (×2): 380 mg via INTRAVENOUS
  Filled 2020-02-14 (×2): qty 9.5

## 2020-02-14 NOTE — H&P (Addendum)
[name] is an 34 y.o. female P1 here for inpatient management of Pelvic inflammatory disease.  She received IM ceftriaxone in office but has been unable to tolerate oral antibiotics at home despite antiemetics use (doxycycline) and with continued pelvic pain despite oral medication usage (hydrocodone- acetaminophen and ibuprofen).  She recently tested positive for chlamydia in office on 02/07/2020.  Patient initially presented with pelvic pain present now for about 2 weeks total.  It began as generalized pain on in bilateral lower abdomen, moderate intensity and sometimes severe intensity, sharp, 9/10 intensity.  She also had nausea and vomiting.She had vaginal discharge.  She reported a new sexual partner.  Over counter pain medication was not improving her pain at home.   Pertinent Gynecological History Menses: flow is moderate Bleeding: No current bleeding.  Contraception: IUD - Paragard.  DES exposure: unknown Blood transfusions: Chlamydia positice 02/07/2020.  Sexually transmitted diseases: recent diagnosis: Chlamydia.  She has a new sexual partner.  Previous GYN Procedures: Laparoscopy as below.   Last pap: normal Date: 03/19/2016.  OB History: G1, P1001   Menstrual History:  LMP; 01/12/20  Past Medical History:  Diagnosis Date  . Allergy    seasonal  . Anxiety   . Aspergillosis (HCC)    sinus  . Chronic idiopathic urticaria   . Complication of anesthesia    prolonged sedation, could not breath after tube removal from sinus surgery, required breathing treatment and was admitted for overnight observation  . Dermatographism   . Duodenitis   . GERD (gastroesophageal reflux disease)   . Herpes   . PCOS (polycystic ovarian syndrome)   . PONV (postoperative nausea and vomiting)    vomiting after tonsillectomy, 2008  Endometriosis.   Past Surgical History:  Procedure Laterality Date  . APPENDECTOMY    . CERVICAL CERCLAGE    . ESOPHAGOGASTRODUODENOSCOPY    . KNEE SURGERY  Right 2007  . LAPAROSCOPIC APPENDECTOMY N/A 08/16/2017   Procedure: APPENDECTOMY LAPAROSCOPIC;  Surgeon: Glenna Fellows, MD;  Location: [facility];  Service: General;  Laterality: N/A;  . LAPAROSCOPIC LYSIS OF ADHESIONS N/A 10/31/2016   Procedure: LAPAROSCOPIC LYSIS OF ADHESIONS;  Surgeon: Hoover Browns, MD;  Location: [facility];  Service: Gynecology;  Laterality: N/A;  . LAPAROSCOPY N/A 10/31/2016   Procedure: LAPAROSCOPY DIAGNOSTIC;  Surgeon: Hoover Browns, MD;  Location: [facility];  Service: Gynecology;  Laterality: N/A;  . NASAL SINUS SURGERY    . TONSILLECTOMY  2008  . WISDOM TOOTH EXTRACTION      Family History  Problem Relation Age of Onset  . Allergies Sister   . Allergies Mother   . Asthma Mother   . Allergies Father     Social History:  reports that she quit smoking about 3 years ago. Her smoking use included cigarettes. She has a 1.00 pack-year smoking history. She has never used smokeless tobacco. She reports that she does not drink alcohol or use drugs.  Allergies:  Allergies  Allergen Reactions  . Penicillins Anaphylaxis    ++++ABLE TO TAKE ROCEPHIN++++ Has patient had a PCN reaction causing immediate rash, facial/tongue/throat swelling, SOB or lightheadedness with hypotension: yes Has patient had a PCN reaction causing severe rash involving mucus membranes or skin necrosis: unknown Has patient had a PCN reaction that required hospitalization no Has patient had a PCN reaction occurring within the last 10 years: yes If all of the above answers are "NO", then may proceed with Cephalosporin use.   . Shrimp [Shellfish Allergy] Anaphylaxis  . Eggs Or  Egg-Derived Products Nausea Only    Yolk  . Soy Allergy Nausea Only  . Wheat Bran Other (See Comments)    inflammation    Medications Prior to Admission  Medication Sig Dispense Refill Last Dose  . ALPRAZolam (XANAX) 1 MG tablet Take 1 tablet by mouth twice daily as needed for anxiety 60 tablet 0   . busPIRone (BUSPAR) 30 MG tablet  Take 1 tablet (30 mg total) by mouth 2 (two) times daily. 60 tablet 5   . cyclobenzaprine (FLEXERIL) 5 MG tablet Take 1 tablet (5 mg total) by mouth 2 (two) times daily as needed for muscle spasms. (Patient not taking: Reported on 10/02/2018) 20 tablet 0   . doxazosin (CARDURA) 2 MG tablet Take 1 tablet (2 mg total) by mouth daily. 30 tablet 5   . DULoxetine (CYMBALTA) 30 MG capsule Take 1 capsule (30 mg total) by mouth daily. Take w/ 60 mg=90mg  30 capsule 1   . DULoxetine (CYMBALTA) 60 MG capsule Take 1 capsule (60 mg total) by mouth every morning. 30 capsule 5   . EPINEPHrine (EPIPEN IJ) Inject 1 each as directed once.     . gabapentin (NEURONTIN) 600 MG tablet Take 1 tablet (600 mg total) by mouth at bedtime. 30 tablet 5   . HYDROcodone-acetaminophen (NORCO/VICODIN) 5-325 MG tablet Take 1 tablet by mouth every 8 (eight) hours as needed for severe pain. 6 tablet 0   . hydrOXYzine (ATARAX/VISTARIL) 25 MG tablet Take 1-2 tablets (25-50 mg total) by mouth every 8 (eight) hours as needed. 30 tablet 5   . ibuprofen (ADVIL,MOTRIN) 800 MG tablet Take 1 tablet (800 mg total) by mouth every 8 (eight) hours as needed for moderate pain or cramping. 30 tablet 0   . metFORMIN (GLUCOPHAGE) 500 MG tablet Take 500 mg by mouth daily.  1   . ondansetron (ZOFRAN ODT) 4 MG disintegrating tablet Take 1 tablet (4 mg total) by mouth every 8 (eight) hours as needed for nausea or vomiting. 20 tablet 0   . PARAGARD INTRAUTERINE COPPER IUD IUD 1 each by Intrauterine route once.      CBC    Component Value Date/Time   WBC 12.1 (H) 02/14/2020 1926   RBC 4.52 02/14/2020 1926   HGB 14.3 02/14/2020 1926   HCT 42.0 02/14/2020 1926   PLT 234 02/14/2020 1926   MCV 92.9 02/14/2020 1926   MCH 31.6 02/14/2020 1926   MCHC 34.0 02/14/2020 1926   RDW 11.8 02/14/2020 1926   LYMPHSABS 1.5 02/14/2020 1926   MONOABS 0.5 02/14/2020 1926   EOSABS 0.1 02/14/2020 1926   BASOSABS 0.1 02/14/2020 1926   CMP     Component Value  Date/Time   NA 137 02/14/2020 1926   K 4.0 02/14/2020 1926   CL 105 02/14/2020 1926   CO2 24 02/14/2020 1926   GLUCOSE 114 (H) 02/14/2020 1926   BUN 9 02/14/2020 1926   CREATININE 0.84 02/14/2020 1926   CALCIUM 8.3 (L) 02/14/2020 1926   PROT 6.1 (L) 02/14/2020 1926   ALBUMIN 3.6 02/14/2020 1926   AST 29 02/14/2020 1926   ALT 46 (H) 02/14/2020 1926   ALKPHOS 73 02/14/2020 1926   BILITOT 0.3 02/14/2020 1926   GFRNONAA >60 02/14/2020 1926   GFRAA >60 02/14/2020 1926   Pelvic Ultrasound: 02/14/2020.   IMPRESSION: 1. Unremarkable pelvic ultrasound. No evidence of hydrosalpinx or tubo-ovarian abscess. 2. Intrauterine device appropriately positioned in the endometrium.  Electronically Signed   By: Ivette Loyal.D.  On: 02/14/2020 20:55  Review of Systems Constitutional: Denies fevers.  With chills Cardiovascular: Denies chest pain or palpitations Pulmonary: Denies coughing or wheezing Gastrointestinal:  With nausea and vomiting.  Denies diarrhea.  Genitourinary: With pelvic pain, Denies unusual vaginal bleeding, unusual vaginal discharge, dysuria, urgency or frequency.  Musculoskeletal: Denies muscle or joint aches and pain.  Neurology: Denies abnormal sensations such as tingling or numbness.   Blood pressure 131/82, pulse (!) 101, temperature 98.4 F (36.9 C), temperature source Oral, resp. rate 12, SpO2 100 %.  Vitals:   02/14/20 1907 02/14/20 1936 02/14/20 2200  BP: 131/82 113/74 112/67  Pulse: (!) 101 79 73  Resp: 12 14 15   Temp: 98.4 F (36.9 C) 98.1 F (36.7 C) 97.7 F (36.5 C)  TempSrc: Oral Oral Oral  SpO2: 100% 100% 99%   Physical Exam  Constitutional: She is oriented to person, place, and time. She appears well-developed and well-nourished.  She appears tired and in pain.  HENT:  Head: Normocephalic and atraumatic.  Eyes: Conjunctivae and EOM are normal.  Neck: Normal range of motion. Neck supple.  Cardiovascular: Normal rate, regular rhythm,  normal heart sounds and intact distal pulses.  Respiratory: Effort normal and breath sounds normal.  GI: Soft. She exhibits no mass. There is no tenderness.  Genitourinary: Vagina with moderate  white discharge.  Uterus normal. With tenderness in uterus left greater than right side and with left adnexal tenderness.    Musculoskeletal: Normal range of motion.  Neurological: She is alert and oriented to person, place, and time.  Skin: Skin is warm and dry.  Psychiatric: She has a normal mood and affect. Judgment normal.   Assessment/Plan: 34 y/o P1 with recent positive chlamydia test and pelvic pain, found to have uterine tenderness and left adnexal tenderness, with diagnosis of pelvic inflammatory disease.  She has and failed outpatient therapy as with nausea and vomiting and inability to tolerate oral medication and has continued pelvic pain unresponsive to oral pain medication usage (hydrocodone-acetaminophen and ibuprofen), Admit to [facility] IV antibiotics  Antiemetics Pain management Alinda Dooms, MD.  02/14/2020, 7:09 PM

## 2020-02-14 NOTE — Telephone Encounter (Signed)
Next apt 05/05/2020

## 2020-02-14 NOTE — Progress Notes (Addendum)
Pharmacy Antibiotic Note  [name] is a 34 y.o. female admitted on 02/14/2020 for inpatient management of PID. Pt was unable to tolerate oral antibiotics at home. Pharmacy has been consulted for gentamicin dosing.  WBC 14.9, afebrile, Scr 0.96 (02/03/20), CrCl 84.8 ml/min  Plan: Gentamicin 380 mg IV Q 24 hrs  Monitor WBC, temp, clinical improvement, renal function  Temp (24hrs), Avg:98.4 F (36.9 C), Min:98.4 F (36.9 C), Max:98.4 F (36.9 C)    Estimated Creatinine Clearance: 84.8 mL/min (by C-G formula based on SCr of 0.96 mg/dL).    Allergies  Allergen Reactions  . Penicillins Anaphylaxis    ++++ABLE TO TAKE ROCEPHIN++++ Has patient had a PCN reaction causing immediate rash, facial/tongue/throat swelling, SOB or lightheadedness with hypotension: yes Has patient had a PCN reaction causing severe rash involving mucus membranes or skin necrosis: unknown Has patient had a PCN reaction that required hospitalization no Has patient had a PCN reaction occurring within the last 10 years: yes If all of the above answers are "NO", then may proceed with Cephalosporin use.   . Shrimp [Shellfish Allergy] Anaphylaxis  . Eggs Or Egg-Derived Products Nausea Only    Yolk  . Soy Allergy Nausea Only  . Wheat Bran Other (See Comments)    inflammation    Antimicrobials this admission: 5/24 clindamycin >> 5/2 gentamicin >>   Thank you for allowing pharmacy to be a part of this patient's care.  Vicki Mallet, PharmD, BCPS, [facility] Clinical Pharmacist 02/14/2020 7:21 PM

## 2020-02-14 NOTE — Progress Notes (Signed)
Patient arrives on unit as a direct admit. Patient ambulates independently without incident to room 32. Pt is alert and oriented x4.

## 2020-02-14 NOTE — Progress Notes (Signed)
Pt ambulates independently to bathroom to change into patient gown. Pt reports moderate abdominal pain. Vital signs taken. Pt oriented to room. Pt denies any needs at this time.

## 2020-02-15 ENCOUNTER — Encounter (HOSPITAL_COMMUNITY): Payer: Self-pay | Admitting: Obstetrics & Gynecology

## 2020-02-15 ENCOUNTER — Other Ambulatory Visit: Payer: Self-pay

## 2020-02-15 DIAGNOSIS — N739 Female pelvic inflammatory disease, unspecified: Secondary | ICD-10-CM | POA: Diagnosis not present

## 2020-02-15 LAB — PREGNANCY, URINE: Preg Test, Ur: NEGATIVE

## 2020-02-15 LAB — MRSA PCR SCREENING: MRSA by PCR: NEGATIVE

## 2020-02-15 LAB — SARS CORONAVIRUS 2 BY RT PCR (HOSPITAL ORDER, PERFORMED IN ~~LOC~~ HOSPITAL LAB): SARS Coronavirus 2: NEGATIVE

## 2020-02-15 MED ORDER — ONDANSETRON 8 MG PO TBDP: 8.0000 mg | ORAL_TABLET | Freq: Three times a day (TID) | ORAL | 0 refills | Status: DC | PRN

## 2020-02-15 MED ORDER — IBUPROFEN 800 MG PO TABS: 800.0000 mg | ORAL_TABLET | Freq: Three times a day (TID) | ORAL | 0 refills | Status: DC | PRN

## 2020-02-15 MED ORDER — HYDROMORPHONE HCL 2 MG PO TABS
2.0000 mg | ORAL_TABLET | ORAL | Status: DC | PRN
  Filled 2020-02-15: qty 1

## 2020-02-15 MED ORDER — HYDROMORPHONE HCL 4 MG PO TABS: 4.0000 mg | ORAL_TABLET | ORAL | 0 refills | Status: DC | PRN

## 2020-02-15 MED ORDER — HYDROMORPHONE HCL 2 MG PO TABS
4.0000 mg | ORAL_TABLET | ORAL | Status: DC | PRN
  Administered 2020-02-15: 4 mg via ORAL
  Administered 2020-02-15: 2 mg via ORAL
  Filled 2020-02-15: qty 2

## 2020-02-15 NOTE — Progress Notes (Signed)
Subjective: Patient reports she still has abdominal and pelvic pain especially when she moves around. The ibuprofen and hydrocodone-acetaminophen seem to help with the pain when she is resting but when she moves the pain comes back again.    Objective: Vitals:   02/14/20 2200 02/15/20 0216 02/15/20 0601 02/15/20 1040  BP: 112/67 104/62 111/76 102/67  Pulse: 73 67 67 62  Resp: 15 16 16    Temp: 97.7 F (36.5 C) 98.1 F (36.7 C) 98.2 F (36.8 C) 98.3 F (36.8 C)  TempSrc: Oral Oral Oral Oral  SpO2: 99% 97% 100% 100%   General: alert, cooperative and no distress Resp: clear to auscultation bilaterally Cardio: regular rate and rhythm, S1, S2 normal, no murmur, click, rub or gallop GI: Soft, tender to palpation in left lower quadrant, no rebound, no guarding.  Extremities: no edema, redness or tenderness in the calves or thighs Vaginal Bleeding: none  Current Facility-Administered Medications:  .  0.9 %  sodium chloride infusion, , Intravenous, Continuous, Madhuri Vacca, MD, Last Rate: 125 mL/hr at 02/15/20 0700, Rate Verify at 02/15/20 0700 .  clindamycin (CLEOCIN) IVPB 900 mg, 900 mg, Intravenous, Q8H, 02/17/20, MD, Stopped at 02/15/20 [phone] .  gentamicin (GARAMYCIN) 380 mg in dextrose 5 % 100 mL IVPB, 5 mg/kg, Intravenous, Q24H, 0973, MD, Stopped at 02/14/20 2216 .  HYDROmorphone (DILAUDID) tablet 2 mg, 2 mg, Oral, Q4H PRN, 2217, Jeyson Deshotel, MD .  HYDROmorphone (DILAUDID) tablet 4 mg, 4 mg, Oral, Q4H PRN, Sallye Ober, MD, 2 mg at 02/15/20 1229 .  ibuprofen (ADVIL) tablet 800 mg, 800 mg, Oral, Q8H PRN, 02/17/20, Zaylia Riolo, MD, 800 mg at 02/14/20 2226 .  ketorolac (TORADOL) 30 MG/ML injection 30 mg, 30 mg, Intravenous, Q8H PRN, 2227, Shaleen Talamantez, MD, 30 mg at 02/15/20 0052 .  ondansetron (ZOFRAN-ODT) disintegrating tablet 8 mg, 8 mg, Oral, Q8H PRN, 02/17/20, MD  Assessment/Plan: 34 y/o admitted for inpatient management of pelvic inflammatory disease, continues with left sided abdominal/pelvic pain, -  Needs better pain control, try dilaudid instead of hydrocodone-acetaminophen. - Continue with IV antibitiocs and plan for continued oral antibiotics as an outpatient.  -   LOS: 0 days  38, MD.  02/15/2020, 12:29 PM

## 2020-02-15 NOTE — Discharge Summary (Signed)
Physician Discharge Summary  Patient ID: [name] MRN: [id number] DOB/AGE: 04-06-1986 34 y.o.  Admit date: 02/14/2020 Discharge date: 02/15/2020  Admission Diagnoses:   1.  Pelvic Inflammatory Disease 2. Failed outpatient therapy.   Discharge Diagnoses:  Active Problems:   PID (acute pelvic inflammatory disease) 2. Stable for outpatient therapy.  Discharged Condition: stable  Hospital Course: Patient received Intravenous antibiotics for over 24 hours. She also received pain management with IV and oral medication and antiemetics were ordered.  Her nausea and vomiting resolved and she was able to tolerate oral medication.  She remained afebrile and her abdominal pain improved.  She was deemed stable for discharge and she was instructed to continue with oral antibiotics and pain medication at home.   Consults: None  Significant Diagnostic Studies: labs: and radiology: Ultrasound: Pelvic Ultrasound was normal. (02/14/20) CBC    Component Value Date/Time   WBC 12.1 (H) 02/14/2020 1926   RBC 4.52 02/14/2020 1926   HGB 14.3 02/14/2020 1926   HCT 42.0 02/14/2020 1926   PLT 234 02/14/2020 1926   MCV 92.9 02/14/2020 1926   MCH 31.6 02/14/2020 1926   MCHC 34.0 02/14/2020 1926   RDW 11.8 02/14/2020 1926   LYMPHSABS 1.5 02/14/2020 1926   MONOABS 0.5 02/14/2020 1926   EOSABS 0.1 02/14/2020 1926   BASOSABS 0.1 02/14/2020 1926    Treatments: IV hydration, antibiotics: gentamicin and Clindamycin and analgesia: acetaminophen, Vicodin, Dilaudid and Ibuprofen and toradol.   Discharge Exam: Blood pressure 118/66, pulse 67, temperature 98.2 F (36.8 C), temperature source Oral, resp. rate 16, height 5\' 4"  (1.626 m), weight 77.1 kg, SpO2 100 %. General appearance: alert, cooperative and no distress Resp: clear to auscultation bilaterally Cardio: regular rate and rhythm, S1, S2 normal, no murmur, click, rub or gallop GI: soft, tender to palpation in left lower quadrant, no rebound, no  guarding.  Extremities: extremities normal, atraumatic, no cyanosis or edema  Disposition: Discharge disposition: 01-Home or Self Care       Allergies as of 02/15/2020      Reactions   Penicillins Anaphylaxis   ++++ABLE TO TAKE ROCEPHIN++++ Has patient had a PCN reaction causing immediate rash, facial/tongue/throat swelling, SOB or lightheadedness with hypotension: yes Has patient had a PCN reaction causing severe rash involving mucus membranes or skin necrosis: unknown Has patient had a PCN reaction that required hospitalization no Has patient had a PCN reaction occurring within the last 10 years: yes If all of the above answers are "NO", then may proceed with Cephalosporin use.   Shrimp [shellfish Allergy] Anaphylaxis   Eggs Or Egg-derived Products Nausea Only   Yolk   Soy Allergy Nausea Only   Wheat Bran Other (See Comments)   inflammation      Medication List    STOP taking these medications   ALPRAZolam 1 MG tablet Commonly known as: XANAX   busPIRone 30 MG tablet Commonly known as: BUSPAR   doxazosin 2 MG tablet Commonly known as: CARDURA   doxycycline 100 MG capsule Commonly known as: VIBRAMYCIN   DULoxetine 60 MG capsule Commonly known as: Cymbalta   EPIPEN IJ   gabapentin 600 MG tablet Commonly known as: NEURONTIN   HYDROcodone-acetaminophen 5-325 MG tablet Commonly known as: NORCO/VICODIN   hydrOXYzine 25 MG tablet Commonly known as: ATARAX/VISTARIL     TAKE these medications   HYDROmorphone 4 MG tablet Commonly known as: DILAUDID Take 1 tablet (4 mg total) by mouth every 4 (four) hours as needed for severe pain.  ibuprofen 800 MG tablet Commonly known as: ADVIL Take 1 tablet (800 mg total) by mouth every 8 (eight) hours as needed for moderate pain or cramping.   ondansetron 4 MG disintegrating tablet Commonly known as: Zofran ODT Take 1 tablet (4 mg total) by mouth every 8 (eight) hours as needed for nausea or vomiting. What changed:  Another medication with the same name was added. Make sure you understand how and when to take each.   ondansetron 8 MG disintegrating tablet Commonly known as: ZOFRAN-ODT Take 1 tablet (8 mg total) by mouth every 8 (eight) hours as needed for nausea or vomiting. What changed: You were already taking a medication with the same name, and this prescription was added. Make sure you understand how and when to take each.   paragard intrauterine copper Iud IUD 1 each by Intrauterine route once.     Azithromycin 500 mg PO x 1 then 250 mg PO X 7 days. Metronidazole 500 mg PO BID X 7 days. Diflucan 150 mg PO X 1   Follow-up Information    Hoover Browns, MD Follow up in 1 week(s).   Specialty: Obstetrics and Gynecology Why: Call the office to make a 1 week office follow up.  Contact information: [address] [place] [address] [phone]          Signed: Konrad Felix, MD.   02/15/2020, 8:29 PM

## 2020-02-15 NOTE — Discharge Instructions (Signed)
[facility] [address] [place], [place] [address]

## 2020-03-15 ENCOUNTER — Other Ambulatory Visit: Payer: Self-pay | Admitting: Physician Assistant

## 2020-03-16 ENCOUNTER — Other Ambulatory Visit (HOSPITAL_COMMUNITY): Payer: Self-pay | Admitting: Gastroenterology

## 2020-03-16 ENCOUNTER — Other Ambulatory Visit: Payer: Self-pay | Admitting: Gastroenterology

## 2020-03-16 DIAGNOSIS — R1011 Right upper quadrant pain: Secondary | ICD-10-CM

## 2020-03-16 DIAGNOSIS — R110 Nausea: Secondary | ICD-10-CM

## 2020-03-16 NOTE — Telephone Encounter (Signed)
She is scheduled 08/13 but you have not seen her since March 2021  Looks like she's been in and out of the hospital is how it got taken off her medication profile

## 2020-03-22 ENCOUNTER — Other Ambulatory Visit: Payer: Self-pay

## 2020-03-22 ENCOUNTER — Encounter (HOSPITAL_COMMUNITY): Payer: Self-pay | Admitting: Emergency Medicine

## 2020-03-22 ENCOUNTER — Emergency Department (HOSPITAL_COMMUNITY)
Admission: EM | Admit: 2020-03-22 | Discharge: 2020-03-23 | Disposition: A | Discharge status: Home / Self Care | Payer: BLUE CROSS/BLUE SHIELD | Attending: Emergency Medicine | Admitting: Emergency Medicine

## 2020-03-22 DIAGNOSIS — Z5321 Procedure and treatment not carried out due to patient leaving prior to being seen by health care provider: Secondary | ICD-10-CM | POA: Diagnosis not present

## 2020-03-22 DIAGNOSIS — R109 Unspecified abdominal pain: Secondary | ICD-10-CM | POA: Insufficient documentation

## 2020-03-22 LAB — COMPREHENSIVE METABOLIC PANEL
ALT: 15 U/L (ref 0–44)
AST: 16 U/L (ref 15–41)
Albumin: 3.5 g/dL (ref 3.5–5.0)
Alkaline Phosphatase: 50 U/L (ref 38–126)
Anion gap: 7 (ref 5–15)
BUN: 13 mg/dL (ref 6–20)
CO2: 24 mmol/L (ref 22–32)
Calcium: 8.4 mg/dL — ABNORMAL LOW (ref 8.9–10.3)
Chloride: 108 mmol/L (ref 98–111)
Creatinine, Ser: 1 mg/dL (ref 0.44–1.00)
GFR calc Af Amer: >60 mL/min (ref >60)
GFR calc non Af Amer: >60 mL/min (ref >60)
Glucose, Bld: 87 mg/dL (ref 70–99)
Potassium: 4.1 mmol/L (ref 3.5–5.1)
Sodium: 139 mmol/L (ref 135–145)
Total Bilirubin: 0.4 mg/dL (ref 0.3–1.2)
Total Protein: 6.3 g/dL — ABNORMAL LOW (ref 6.5–8.1)

## 2020-03-22 LAB — URINALYSIS, ROUTINE W REFLEX MICROSCOPIC
Bilirubin Urine: NEGATIVE
Glucose, UA: NEGATIVE mg/dL
Hgb urine dipstick: NEGATIVE
Ketones, ur: NEGATIVE mg/dL
Leukocytes,Ua: NEGATIVE
Nitrite: NEGATIVE
Protein, ur: NEGATIVE mg/dL
Specific Gravity, Urine: 1.028 (ref 1.005–1.030)
pH: 5 (ref 5.0–8.0)

## 2020-03-22 LAB — CBC
HCT: 44.3 % (ref 36.0–46.0)
Hemoglobin: 14.4 g/dL (ref 12.0–15.0)
MCH: 30.5 pg (ref 26.0–34.0)
MCHC: 32.5 g/dL (ref 30.0–36.0)
MCV: 93.9 fL (ref 80.0–100.0)
Platelets: 264 10*3/uL (ref 150–400)
RBC: 4.72 MIL/uL (ref 3.87–5.11)
RDW: 12.2 % (ref 11.5–15.5)
WBC: 12.1 10*3/uL — ABNORMAL HIGH (ref 4.0–10.5)
nRBC: 0 % (ref 0.0–0.2)

## 2020-03-22 LAB — LIPASE, BLOOD: Lipase: 29 U/L (ref 11–51)

## 2020-03-22 LAB — I-STAT BETA HCG BLOOD, ED (MC, WL, AP ONLY): I-stat hCG, quantitative: <5 m[IU]/mL (ref <5)

## 2020-03-22 MED ORDER — SODIUM CHLORIDE 0.9% FLUSH: 3.0000 mL | Freq: Once | INTRAVENOUS | Status: DC

## 2020-03-22 NOTE — ED Triage Notes (Signed)
Pt c/o right sided abdominal pain x 6 months with nausea. Has been seen by her PCP, but unable to find a source of pain.

## 2020-03-23 NOTE — ED Notes (Signed)
Pt called for VS recheck x3, no response. 

## 2020-03-30 ENCOUNTER — Encounter (HOSPITAL_COMMUNITY): Payer: Self-pay

## 2020-03-30 ENCOUNTER — Encounter (HOSPITAL_COMMUNITY): Payer: BLUE CROSS/BLUE SHIELD

## 2020-04-15 ENCOUNTER — Other Ambulatory Visit: Payer: Self-pay | Admitting: Physician Assistant

## 2020-04-16 NOTE — Telephone Encounter (Signed)
Next apt 08/13

## 2020-05-05 ENCOUNTER — Ambulatory Visit: Payer: BLUE CROSS/BLUE SHIELD | Admitting: Physician Assistant

## 2020-05-14 ENCOUNTER — Other Ambulatory Visit: Payer: Self-pay | Admitting: Physician Assistant

## 2020-05-15 NOTE — Telephone Encounter (Signed)
No show on 08/13

## 2020-05-19 ENCOUNTER — Telehealth: Payer: Self-pay | Admitting: Physician Assistant

## 2020-05-19 ENCOUNTER — Other Ambulatory Visit: Payer: Self-pay | Admitting: Physician Assistant

## 2020-05-19 MED ORDER — ALPRAZOLAM 1 MG PO TABS: 0.5000 mg | ORAL_TABLET | Freq: Two times a day (BID) | ORAL | 0 refills | Status: DC | PRN

## 2020-05-19 NOTE — Telephone Encounter (Signed)
I sent in Xanax #10 only. Please call her on Monday and ask why she's not on an antidepressant.  I won't Rx a benzo w/o another form of anxiolytic. This IS NOT urgent [name]!

## 2020-05-19 NOTE — Telephone Encounter (Signed)
Please review

## 2020-05-19 NOTE — Telephone Encounter (Signed)
[name] called because she missed her appt 8/13 because she thought it was telephone.  She rescheduled 10/1.  Please send in refills for her medication to be able to get to her appt.

## 2020-05-21 NOTE — Telephone Encounter (Signed)
Scheduled 06/23/20 now

## 2020-05-22 NOTE — Telephone Encounter (Signed)
LM to call back to discuss her medications.

## 2020-06-23 ENCOUNTER — Telehealth: Payer: Self-pay | Admitting: Physician Assistant

## 2020-06-23 ENCOUNTER — Encounter: Payer: Self-pay | Admitting: Physician Assistant

## 2020-06-23 ENCOUNTER — Telehealth (INDEPENDENT_AMBULATORY_CARE_PROVIDER_SITE_OTHER): Payer: BLUE CROSS/BLUE SHIELD | Admitting: Physician Assistant

## 2020-06-23 DIAGNOSIS — F4310 Post-traumatic stress disorder, unspecified: Secondary | ICD-10-CM

## 2020-06-23 DIAGNOSIS — F3341 Major depressive disorder, recurrent, in partial remission: Secondary | ICD-10-CM

## 2020-06-23 MED ORDER — DULOXETINE HCL 60 MG PO CPEP: 60.0000 mg | ORAL_CAPSULE | Freq: Every day | ORAL | 1 refills | Status: DC

## 2020-06-23 MED ORDER — HYDROXYZINE HCL 10 MG PO TABS: 10.0000 mg | ORAL_TABLET | Freq: Four times a day (QID) | ORAL | 1 refills | Status: DC | PRN

## 2020-06-23 MED ORDER — ALPRAZOLAM 1 MG PO TABS: 1.0000 mg | ORAL_TABLET | Freq: Two times a day (BID) | ORAL | 5 refills | Status: DC | PRN

## 2020-06-23 NOTE — Telephone Encounter (Signed)
Ms. mixtli, reno are scheduled for a virtual visit with your provider today.    Just as we do with appointments in the office, we must obtain your consent to participate.  Your consent will be active for this visit and any virtual visit you may have with one of our providers in the next 365 days.    If you have a MyChart account, I can also send a copy of this consent to you electronically.  All virtual visits are billed to your insurance company just like a traditional visit in the office.  As this is a virtual visit, video technology does not allow for your provider to perform a traditional examination.  This may limit your provider's ability to fully assess your condition.  If your provider identifies any concerns that need to be evaluated in person or the need to arrange testing such as labs, EKG, etc, we will make arrangements to do so.    Although advances in technology are sophisticated, we cannot ensure that it will always work on either your end or our end.  If the connection with a video visit is poor, we may have to switch to a telephone visit.  With either a video or telephone visit, we are not always able to ensure that we have a secure connection.   I need to obtain your verbal consent now.   Are you willing to proceed with your visit today?   LULU HIRSCHMANN has provided verbal consent on 06/23/2020 for a virtual visit (video or telephone).   Melony Overly, PA-C 06/23/2020  11:18 AM
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
## 2020-10-18 DIAGNOSIS — N739 Female pelvic inflammatory disease, unspecified: Secondary | ICD-10-CM | POA: Insufficient documentation

## 2020-10-18 DIAGNOSIS — K581 Irritable bowel syndrome with constipation: Secondary | ICD-10-CM | POA: Insufficient documentation

## 2020-10-18 HISTORY — DX: Female pelvic inflammatory disease, unspecified: N73.9

## 2020-10-18 HISTORY — DX: Irritable bowel syndrome with constipation: K58.1

## 2020-12-14 ENCOUNTER — Other Ambulatory Visit: Payer: Self-pay | Admitting: Physician Assistant

## 2020-12-15 NOTE — Telephone Encounter (Signed)
Last apt10/01 due back 3 months

## 2020-12-19 ENCOUNTER — Telehealth: Payer: Self-pay | Admitting: Physician Assistant

## 2020-12-19 ENCOUNTER — Other Ambulatory Visit: Payer: Self-pay

## 2020-12-19 MED ORDER — DULOXETINE HCL 60 MG PO CPEP: 60.0000 mg | ORAL_CAPSULE | Freq: Every day | ORAL | 0 refills | Status: DC

## 2020-12-19 MED ORDER — HYDROXYZINE HCL 10 MG PO TABS: 10.0000 mg | ORAL_TABLET | Freq: Four times a day (QID) | ORAL | 0 refills | Status: DC | PRN

## 2020-12-19 NOTE — Telephone Encounter (Signed)
Pended Xanax Rx's sent

## 2020-12-19 NOTE — Telephone Encounter (Signed)
Pt called and made an appt for 03/02/21. She needs refills on her cymbalta 60 mg, hydroxyzine 10 mg and xanax 1 mg to be sent to the [facility] [address] in [place]

## 2020-12-20 MED ORDER — ALPRAZOLAM 1 MG PO TABS: 1.0000 mg | ORAL_TABLET | Freq: Two times a day (BID) | ORAL | 2 refills | Status: DC | PRN

## 2021-03-02 ENCOUNTER — Encounter: Payer: Self-pay | Admitting: Physician Assistant

## 2021-03-02 ENCOUNTER — Telehealth (INDEPENDENT_AMBULATORY_CARE_PROVIDER_SITE_OTHER): Payer: BLUE CROSS/BLUE SHIELD | Admitting: Physician Assistant

## 2021-03-02 DIAGNOSIS — F411 Generalized anxiety disorder: Secondary | ICD-10-CM

## 2021-03-02 DIAGNOSIS — F331 Major depressive disorder, recurrent, moderate: Secondary | ICD-10-CM

## 2021-03-02 MED ORDER — DESVENLAFAXINE SUCCINATE ER 25 MG PO TB24: 25.0000 mg | ORAL_TABLET | Freq: Every day | ORAL | 1 refills | Status: DC

## 2021-03-02 NOTE — Progress Notes (Signed)
[facility]  Patient ID: REANNAH TOTTEN,  MRN: [id number]  PCP: Hoover Browns, MD  Date of Evaluation: 03/02/2021 Time spent:25 minutes  Chief Complaint:  Chief Complaint   Anxiety; Depression     Virtual Visit via Telehealth  I connected with patient by telephone, with their informed consent, and verified patient privacy and that I am speaking with the correct person using two identifiers.  I am private, in my office and the patient is at home.  I discussed the limitations, risks, security and privacy concerns of performing an evaluation and management service by telephone and the availability of in person appointments. I also discussed with the patient that there may be a patient responsible charge related to this service. The patient expressed understanding and agreed to proceed.   I discussed the assessment and treatment plan with the patient. The patient was provided an opportunity to ask questions and all were answered. The patient agreed with the plan and demonstrated an understanding of the instructions.   The patient was advised to call back or seek an in-person evaluation if the symptoms worsen or if the condition fails to improve as anticipated.  I provided 25 minutes of non-face-to-face time during this encounter.  HISTORY/CURRENT STATUS: For routine med check.  Cymbalta was causing a lot of abdominal pain, did not matter whether she took it in the mornings or evenings, with or without food so she stopped it a couple of months ago.  She has been diagnosed lactose intolerant and has been feeling better after changing her diet to some.  Since going off the Cymbalta she has had more anxiety.  The Xanax does help but she does not want to rely on it all the time.  She will feel panicky especially before going into work at times.  Most of the time it is a generalized sense of anxiety like something bad is fixing to happen.  When she does get a panic attack is  associated with shortness of breath and sweaty palms.   Work is going well.  She sleeps well most of the time.  She is able to enjoy things.  Energy and motivation are good.  She does not cry easily.  She is not isolating.  Denies suicidal or homicidal thoughts.  Patient denies increased energy with decreased need for sleep, no increased talkativeness, no racing thoughts, no impulsivity or risky behaviors, no increased spending, no increased libido, no grandiosity, and no hallucinations.  Denies dizziness, syncope, seizures, numbness, tingling, tremor, tics, unsteady gait, slurred speech, confusion. Denies muscle or joint pain, stiffness, or dystonia.  Individual Medical History/ Review of Systems: Changes? :Yes  See HPI  Past medications for mental health diagnoses include: Celexa, Klonopin, prazosin, Seroquel, Ativan, Lexapro  Allergies: Penicillins, Shrimp [shellfish allergy], Eggs or egg-derived products, Soy allergy, and Wheat bran  Current Medications:  Current Outpatient Medications:    ALPRAZolam (XANAX) 1 MG tablet, Take 1 tablet (1 mg total) by mouth 2 (two) times daily as needed. for anxiety, Disp: 60 tablet, Rfl: 2   Desvenlafaxine Succinate ER (PRISTIQ) 25 MG TB24, Take 25 mg by mouth daily., Disp: 30 tablet, Rfl: 1   gabapentin (NEURONTIN) 600 MG tablet, Take 600 mg by mouth at bedtime., Disp: , Rfl:    hydrOXYzine (ATARAX/VISTARIL) 10 MG tablet, Take 1 tablet (10 mg total) by mouth every 6 (six) hours as needed., Disp: 120 tablet, Rfl: 0   ondansetron (ZOFRAN ODT) 4 MG disintegrating tablet, Take 1 tablet (4 mg  total) by mouth every 8 (eight) hours as needed for nausea or vomiting., Disp: 20 tablet, Rfl: 0   DULoxetine (CYMBALTA) 60 MG capsule, Take 1 capsule (60 mg total) by mouth daily. (Patient not taking: Reported on 03/02/2021), Disp: 90 capsule, Rfl: 0   HYDROmorphone (DILAUDID) 4 MG tablet, Take 1 tablet (4 mg total) by mouth every 4 (four) hours as needed for severe  pain. (Patient not taking: No sig reported), Disp: 30 tablet, Rfl: 0   ibuprofen (ADVIL) 800 MG tablet, Take 1 tablet (800 mg total) by mouth every 8 (eight) hours as needed for moderate pain or cramping. (Patient not taking: No sig reported), Disp: 30 tablet, Rfl: 0   ondansetron (ZOFRAN-ODT) 8 MG disintegrating tablet, Take 1 tablet (8 mg total) by mouth every 8 (eight) hours as needed for nausea or vomiting. (Patient not taking: No sig reported), Disp: 20 tablet, Rfl: 0   PARAGARD INTRAUTERINE COPPER IUD IUD, 1 each by Intrauterine route once. (Patient not taking: No sig reported), Disp: , Rfl:  Medication Side Effects: none  Family Medical/ Social History: Changes? no  MENTAL HEALTH EXAM:  There were no vitals taken for this visit.There is no height or weight on file to calculate BMI.  General Appearance:  Unable to assess  Eye Contact:   Unable to assess  Speech:  Clear and Coherent and Normal Rate  Volume:  Normal  Mood:  Euthymic  Affect:   Unable to assess  Thought Process:  Goal Directed and Descriptions of Associations: Intact  Orientation:  Full (Time, Place, and Person)  Thought Content: Logical   Suicidal Thoughts:  No  Homicidal Thoughts:  No  Memory:  WNL  Judgement:  Good  Insight:  Good  Psychomotor Activity:   Unable to assess  Concentration:  Concentration: Fair  Recall:  Good  Fund of Knowledge: Good  Language: Good  Assets:  Desire for Improvement  ADL's:  Intact  Cognition: WNL  Prognosis:  Good    DIAGNOSES:    ICD-10-CM   1. Major depressive disorder, recurrent episode, moderate (HCC)  F33.1     2. Generalized anxiety disorder  F41.1        Receiving Psychotherapy: No    RECOMMENDATIONS:  PDMP was reviewed. I provided 25 mins of non-face to face time during this encounter including time spent before and after the visit and records review, medical decision making, and charting. We discussed options of medication that worked similarly to  Cymbalta but hopefully will not cause the same problems with her stomach.  I doubt that the Cymbalta is the problem but it could be, at least in part adding to those symptoms.  According to the chart she has IBS.  At any rate, I recommend Pristiq.  We discussed benefits, risk, side effects and she accepts.  Will start at an extremely low dose and increase as needed and tolerated. Start Pristiq 25 mg, 1 p.o. daily. Continue Xanax 1 mg p.o. twice daily as needed.   Continue hydroxyzine 10 mg, 1-2 every 8 hours as needed anxiety. Recommend counseling. Return in 2 months.  Melony Overly, PA-C

## 2021-03-13 ENCOUNTER — Other Ambulatory Visit: Payer: Self-pay | Admitting: Physician Assistant

## 2021-03-14 NOTE — Telephone Encounter (Signed)
Last visit on 03/02/2021

## 2021-04-20 ENCOUNTER — Other Ambulatory Visit: Payer: Self-pay | Admitting: Physician Assistant

## 2021-04-20 NOTE — Telephone Encounter (Signed)
Last filled 6/29. 

## 2021-04-24 ENCOUNTER — Other Ambulatory Visit: Payer: Self-pay

## 2021-04-24 ENCOUNTER — Telehealth: Payer: Self-pay | Admitting: Physician Assistant

## 2021-04-24 MED ORDER — HYDROXYZINE HCL 10 MG PO TABS: 10.0000 mg | ORAL_TABLET | Freq: Four times a day (QID) | ORAL | 0 refills | Status: DC | PRN

## 2021-04-24 MED ORDER — ALPRAZOLAM 1 MG PO TABS: ORAL_TABLET | ORAL | 0 refills | Status: DC

## 2021-04-24 NOTE — Telephone Encounter (Signed)
Next visit is 06/15/21. Requesting refill on Lorazepam 1 mg and Hydroxyzine 10 mg called to:  [facility] [address], [place] [address] [place] [address] [id number]  Phone:  [phone]  Fax:  [phone]

## 2021-04-24 NOTE — Telephone Encounter (Signed)
Pended.

## 2021-05-24 ENCOUNTER — Other Ambulatory Visit: Payer: Self-pay | Admitting: Physician Assistant

## 2021-05-24 NOTE — Telephone Encounter (Signed)
Last filled 8.2.22

## 2021-05-28 ENCOUNTER — Other Ambulatory Visit: Payer: Self-pay | Admitting: Physician Assistant

## 2021-05-30 ENCOUNTER — Other Ambulatory Visit: Payer: Self-pay | Admitting: Physician Assistant

## 2021-06-05 ENCOUNTER — Other Ambulatory Visit: Payer: Self-pay | Admitting: Obstetrics & Gynecology

## 2021-06-05 DIAGNOSIS — E282 Polycystic ovarian syndrome: Secondary | ICD-10-CM | POA: Insufficient documentation

## 2021-06-05 DIAGNOSIS — R102 Pelvic and perineal pain: Secondary | ICD-10-CM

## 2021-06-15 ENCOUNTER — Ambulatory Visit: Payer: BLUE CROSS/BLUE SHIELD | Admitting: Physician Assistant

## 2021-06-18 ENCOUNTER — Other Ambulatory Visit: Payer: Self-pay | Admitting: Obstetrics & Gynecology

## 2021-06-18 DIAGNOSIS — R102 Pelvic and perineal pain: Secondary | ICD-10-CM

## 2021-06-19 ENCOUNTER — Inpatient Hospital Stay: Admission: RE | Admit: 2021-06-19 | Payer: BLUE CROSS/BLUE SHIELD | Source: Ambulatory Visit

## 2021-06-28 ENCOUNTER — Other Ambulatory Visit: Payer: Self-pay | Admitting: Physician Assistant

## 2021-06-29 NOTE — Telephone Encounter (Signed)
Last filled 9/1

## 2021-07-20 ENCOUNTER — Telehealth: Payer: Self-pay | Admitting: Physician Assistant

## 2021-07-20 ENCOUNTER — Other Ambulatory Visit: Payer: Self-pay | Admitting: Physician Assistant

## 2021-07-20 NOTE — Telephone Encounter (Signed)
Pt needs a refill on her xanax, and prestiq to be [facility] to [facility] [place]. Her next appt is 12/2

## 2021-07-23 ENCOUNTER — Other Ambulatory Visit: Payer: Self-pay

## 2021-07-23 MED ORDER — DESVENLAFAXINE SUCCINATE ER 25 MG PO TB24: 25.0000 mg | ORAL_TABLET | Freq: Every day | ORAL | 1 refills | Status: DC

## 2021-07-23 NOTE — Telephone Encounter (Signed)
Pended.

## 2021-07-23 NOTE — Telephone Encounter (Signed)
Last filled 10/7 for #10 tabs

## 2021-08-24 ENCOUNTER — Ambulatory Visit: Payer: BLUE CROSS/BLUE SHIELD | Admitting: Physician Assistant

## 2021-09-21 ENCOUNTER — Other Ambulatory Visit: Payer: Self-pay | Admitting: Physician Assistant

## 2021-09-26 ENCOUNTER — Other Ambulatory Visit: Payer: Self-pay | Admitting: Physician Assistant

## 2021-09-27 NOTE — Telephone Encounter (Signed)
Please schedule appt

## 2021-09-28 NOTE — Telephone Encounter (Signed)
LVM to RS appt

## 2021-10-06 ENCOUNTER — Other Ambulatory Visit: Payer: Self-pay | Admitting: Physician Assistant

## 2021-10-26 ENCOUNTER — Ambulatory Visit (INDEPENDENT_AMBULATORY_CARE_PROVIDER_SITE_OTHER): Payer: BLUE CROSS/BLUE SHIELD | Admitting: Physician Assistant

## 2021-10-26 ENCOUNTER — Encounter: Payer: Self-pay | Admitting: Physician Assistant

## 2021-10-26 DIAGNOSIS — G2581 Restless legs syndrome: Secondary | ICD-10-CM | POA: Diagnosis not present

## 2021-10-26 DIAGNOSIS — F331 Major depressive disorder, recurrent, moderate: Secondary | ICD-10-CM

## 2021-10-26 DIAGNOSIS — F411 Generalized anxiety disorder: Secondary | ICD-10-CM | POA: Diagnosis not present

## 2021-10-26 DIAGNOSIS — F4310 Post-traumatic stress disorder, unspecified: Secondary | ICD-10-CM | POA: Diagnosis not present

## 2021-10-26 MED ORDER — DESVENLAFAXINE SUCCINATE ER 50 MG PO TB24: 50.0000 mg | ORAL_TABLET | Freq: Every day | ORAL | 1 refills | Status: DC

## 2021-10-26 MED ORDER — ALPRAZOLAM 1 MG PO TABS: 1.0000 mg | ORAL_TABLET | Freq: Two times a day (BID) | ORAL | 0 refills | Status: DC | PRN

## 2021-10-26 NOTE — Progress Notes (Signed)
[facility]  Patient ID: [name],  MRN: [id number]  PCP: Hoover Browns, MD  Date of Evaluation: 10/26/2021 Time spent:25 minutes  Chief Complaint:  Chief Complaint   Anxiety; Depression; Follow-up; Post-Traumatic Stress Disorder    Virtual Visit via Telehealth  I connected with patient by telephone, with their informed consent, and verified patient privacy and that I am speaking with the correct person using two identifiers.  I am private, in my office and the patient is at home.  I discussed the limitations, risks, security and privacy concerns of performing an evaluation and management service by telephone and the availability of in person appointments. I also discussed with the patient that there may be a patient responsible charge related to this service. The patient expressed understanding and agreed to proceed.   I discussed the assessment and treatment plan with the patient. The patient was provided an opportunity to ask questions and all were answered. The patient agreed with the plan and demonstrated an understanding of the instructions.   The patient was advised to call back or seek an in-person evaluation if the symptoms worsen or if the condition fails to improve as anticipated.  I provided 25 minutes of non-face-to-face time during this encounter.   HISTORY/CURRENT STATUS: For routine med check. Missed 6 months overdue for f/u.  States she has covid and couldn't come to the office today. Dx 3 weeks ago but still positive, and not feeling well.   For the past few months, has been feeling more tired, sleeps but doesn't feel rested when she gets up. Having nightmares 3 nights per week.  Not always but sometimes has a hard time going back to sleep after she has a nightmare.  It might take an hour.  Not taking the gabapentin routinely for restless legs.  Does not need it all the time.  Has been treated by her primary provider for that.  Work is going well,  now works in a cardiology office. She is able to enjoy things, crafts at times. Energy and motivation are fair to good depending on the day. Hard to say for the past few weeks, b/c of Covid. Has been doing some cleaning in her home, decluttering. She does not cry easily.  Not isolating.  Appetite is normal.  ADLs and personal hygiene are normal.  Denies suicidal or homicidal thoughts.  Patient denies increased energy with decreased need for sleep, no increased talkativeness, no racing thoughts, no impulsivity or risky behaviors, no increased spending, no increased libido, no grandiosity, and no hallucinations.  Denies dizziness, syncope, seizures, numbness, tingling, tremor, tics, unsteady gait, slurred speech, confusion. Denies muscle or joint pain, stiffness, or dystonia.  Individual Medical History/ Review of Systems: Changes? :Yes  See HPI  Past medications for mental health diagnoses include: Celexa, Klonopin, prazosin, Seroquel, Ativan, Lexapro  Allergies: Penicillins, Shrimp [shellfish allergy], Eggs or egg-derived products, Soy allergy, and Wheat bran  Current Medications:  Current Outpatient Medications:    desvenlafaxine (PRISTIQ) 50 MG 24 hr tablet, Take 1 tablet (50 mg total) by mouth daily., Disp: 30 tablet, Rfl: 1   hydrOXYzine (ATARAX/VISTARIL) 10 MG tablet, TAKE 1 TABLET BY MOUTH EVERY 6 HOURS AS NEEDED, Disp: 120 tablet, Rfl: 0   ondansetron (ZOFRAN ODT) 4 MG disintegrating tablet, Take 1 tablet (4 mg total) by mouth every 8 (eight) hours as needed for nausea or vomiting., Disp: 20 tablet, Rfl: 0   ALPRAZolam (XANAX) 1 MG tablet, Take 1 tablet (1 mg total) by  mouth 2 (two) times daily as needed. for anxiety, Disp: 60 tablet, Rfl: 0   ESTARYLLA 0.25-35 MG-MCG tablet, Take 1 tablet by mouth daily., Disp: , Rfl:    gabapentin (NEURONTIN) 600 MG tablet, Take 600 mg by mouth at bedtime. (Patient not taking: Reported on 10/26/2021), Disp: , Rfl:    HYDROmorphone (DILAUDID) 4 MG tablet,  Take 1 tablet (4 mg total) by mouth every 4 (four) hours as needed for severe pain. (Patient not taking: Reported on 06/23/2020), Disp: 30 tablet, Rfl: 0   ibuprofen (ADVIL) 800 MG tablet, Take 1 tablet (800 mg total) by mouth every 8 (eight) hours as needed for moderate pain or cramping. (Patient not taking: Reported on 06/23/2020), Disp: 30 tablet, Rfl: 0   ondansetron (ZOFRAN-ODT) 8 MG disintegrating tablet, Take 1 tablet (8 mg total) by mouth every 8 (eight) hours as needed for nausea or vomiting. (Patient not taking: Reported on 06/23/2020), Disp: 20 tablet, Rfl: 0   PARAGARD INTRAUTERINE COPPER IUD IUD, 1 each by Intrauterine route once. (Patient not taking: Reported on 06/23/2020), Disp: , Rfl:  Medication Side Effects: none  Family Medical/ Social History: Changes? no  MENTAL HEALTH EXAM:  There were no vitals taken for this visit.There is no height or weight on file to calculate BMI.  General Appearance:  Unable to assess  Eye Contact:   Unable to assess  Speech:  Clear and Coherent and Normal Rate  Volume:  Normal  Mood:  Euthymic  Affect:   Unable to assess  Thought Process:  Goal Directed and Descriptions of Associations: Circumstantial  Orientation:  Full (Time, Place, and Person)  Thought Content: Logical   Suicidal Thoughts:  No  Homicidal Thoughts:  No  Memory:  WNL  Judgement:  Good  Insight:  Good  Psychomotor Activity:   Unable to assess  Concentration:  Concentration: Good and Attention Span: Good  Recall:  Good  Fund of Knowledge: Good  Language: Good  Assets:  Desire for Improvement  ADL's:  Intact  Cognition: WNL  Prognosis:  Good    DIAGNOSES:    ICD-10-CM   1. Major depressive disorder, recurrent episode, moderate (HCC)  F33.1     2. Generalized anxiety disorder  F41.1     3. PTSD (post-traumatic stress disorder)  F43.10     4. Restless leg syndrome  G25.81         Receiving Psychotherapy: No    RECOMMENDATIONS:  PDMP was reviewed.  Last  Xanax filled 10/08/2021. I provided 25 minutes of non-face-to-face time during this encounter, including time spent before and after the visit in records review, medical decision making, counseling pertinent to today's visit, and charting.  Recommend increasing the dose of Pristiq.  That dose was started very low and plan to increase at follow-up appointment in August which was missed.  She has been on this same dose for 8 months now.  She asked about changing to a different antidepressant but I prefer to stick with this and increase dose and monitor response.  Even increasing the dose to 50 mg is low so recommended she not get discouraged if it is not helping right away we will increase again at the next visit if it is helping some but not enough.  She understands and agrees. May need to try doxazosin for nightmares, she has tried prazosin in the past without improvement, but I do not want to prescribe this without seeing her in the office and checking blood pressure.  I am  hopeful that increasing the Pristiq to help with anxiety and depression will help her have restful sleep and not have nightmares. Increase Pristiq to 50 mg, 1 p.o. daily. Continue Xanax 1 mg p.o. twice daily as needed.   Continue hydroxyzine 10 mg, 1-2 every 8 hours as needed anxiety. Continue gabapentin by her PCP as needed. Recommend counseling. Return in 6 weeks.  Melony Overly, PA-C

## 2021-11-06 DIAGNOSIS — F419 Anxiety disorder, unspecified: Secondary | ICD-10-CM | POA: Insufficient documentation

## 2021-11-25 ENCOUNTER — Other Ambulatory Visit: Payer: Self-pay | Admitting: Physician Assistant

## 2021-11-27 NOTE — Telephone Encounter (Signed)
Please call patient to schedule an appt.-

## 2021-11-28 ENCOUNTER — Other Ambulatory Visit: Payer: Self-pay

## 2021-11-28 ENCOUNTER — Telehealth: Payer: Self-pay | Admitting: Physician Assistant

## 2021-11-28 MED ORDER — ALPRAZOLAM 1 MG PO TABS: 1.0000 mg | ORAL_TABLET | Freq: Two times a day (BID) | ORAL | 0 refills | Status: DC | PRN

## 2021-11-28 NOTE — Telephone Encounter (Signed)
Pended.

## 2021-11-28 NOTE — Telephone Encounter (Signed)
Pt called @ 2:07 pm for a follow up apt and requesting Rx for Alprazolam @ [facility] [address] [place]. [address] ?

## 2021-12-12 ENCOUNTER — Ambulatory Visit (INDEPENDENT_AMBULATORY_CARE_PROVIDER_SITE_OTHER): Payer: BLUE CROSS/BLUE SHIELD | Admitting: Physician Assistant

## 2021-12-12 ENCOUNTER — Encounter: Payer: Self-pay | Admitting: Physician Assistant

## 2021-12-12 ENCOUNTER — Other Ambulatory Visit: Payer: Self-pay

## 2021-12-12 DIAGNOSIS — F411 Generalized anxiety disorder: Secondary | ICD-10-CM | POA: Diagnosis not present

## 2021-12-12 DIAGNOSIS — F331 Major depressive disorder, recurrent, moderate: Secondary | ICD-10-CM

## 2021-12-12 DIAGNOSIS — F4310 Post-traumatic stress disorder, unspecified: Secondary | ICD-10-CM | POA: Diagnosis not present

## 2021-12-12 MED ORDER — DULOXETINE HCL 30 MG PO CPEP: 30.0000 mg | ORAL_CAPSULE | Freq: Every day | ORAL | 0 refills | Status: DC

## 2021-12-12 MED ORDER — DULOXETINE HCL 60 MG PO CPEP: 60.0000 mg | ORAL_CAPSULE | Freq: Every day | ORAL | 1 refills | Status: DC

## 2021-12-12 MED ORDER — ALPRAZOLAM 1 MG PO TABS: 1.0000 mg | ORAL_TABLET | Freq: Two times a day (BID) | ORAL | 1 refills | Status: DC | PRN

## 2021-12-12 NOTE — Progress Notes (Signed)
[facility] ? ?Patient ID: [name],  ?MRN: [id number] ? ?PCP: Hoover Browns, MD ? ?Date of Evaluation: 12/12/2021 ?Time spent:20 minutes ? ?Chief Complaint:  ?Chief Complaint   ?Anxiety; Follow-up ?  ? ? ? ?HISTORY/CURRENT STATUS: ?For routine med check. ? ?We tried Pristiq, made her more anxious so she weaned off it. At the same she was dealing with post-COVID and was having to use albuterol for shortness of breath at times.  So not really sure if the increased anxiety was from that or the Pristiq.  At any rate she stopped it. ? ?Anxiety is well controlled with the Xanax.  She is using it pretty much twice a day.  If she does not she has panicky feelings that will not go away.  She will have racing thoughts that she cannot escape from. ? ?Work is going well, now works in a cardiology office. She is able to enjoy things.  Energy and motivation are pretty low since she had COVID a couple of months ago.  Still has fatigue on some days that can be pretty bad.  She is able to work though.  She does not cry easily.  Not isolating.  Appetite is normal.  ADLs and personal hygiene are normal.  Denies suicidal or homicidal thoughts. ? ?Patient denies increased energy with decreased need for sleep, no increased talkativeness, no racing thoughts, no impulsivity or risky behaviors, no increased spending, no increased libido, no grandiosity, no paranoia, and no hallucinations. ? ?Denies dizziness, syncope, seizures, numbness, tingling, tremor, tics, unsteady gait, slurred speech, confusion. Denies muscle or joint pain, stiffness, or dystonia. ? ?Individual Medical History/ Review of Systems: Changes? :Yes  ?Sinus infection ? ?Past medications for mental health diagnoses include: ?Celexa, Klonopin, prazosin, Seroquel, Ativan, Lexapro ? ?Allergies: Penicillins, Shrimp [shellfish allergy], Eggs or egg-derived products, Soy allergy, and Wheat bran ? ?Current Medications:  ?Current Outpatient Medications:  ?   DULoxetine (CYMBALTA) 30 MG capsule, Take 1 capsule (30 mg total) by mouth daily., Disp: 14 capsule, Rfl: 0 ?  DULoxetine (CYMBALTA) 60 MG capsule, Take 1 capsule (60 mg total) by mouth daily. Start after 2 weeks of the 30 mg., Disp: 30 capsule, Rfl: 1 ?  ESTARYLLA 0.25-35 MG-MCG tablet, Take 1 tablet by mouth daily., Disp: , Rfl:  ?  HYDROmorphone (DILAUDID) 4 MG tablet, Take 1 tablet (4 mg total) by mouth every 4 (four) hours as needed for severe pain., Disp: 30 tablet, Rfl: 0 ?  hydrOXYzine (ATARAX/VISTARIL) 10 MG tablet, TAKE 1 TABLET BY MOUTH EVERY 6 HOURS AS NEEDED, Disp: 120 tablet, Rfl: 0 ?  ondansetron (ZOFRAN ODT) 4 MG disintegrating tablet, Take 1 tablet (4 mg total) by mouth every 8 (eight) hours as needed for nausea or vomiting., Disp: 20 tablet, Rfl: 0 ?  ALPRAZolam (XANAX) 1 MG tablet, Take 1 tablet (1 mg total) by mouth 2 (two) times daily as needed. for anxiety, Disp: 60 tablet, Rfl: 1 ?  desvenlafaxine (PRISTIQ) 50 MG 24 hr tablet, Take 1 tablet (50 mg total) by mouth daily. (Patient not taking: Reported on 12/12/2021), Disp: 30 tablet, Rfl: 1 ?  gabapentin (NEURONTIN) 600 MG tablet, Take 600 mg by mouth at bedtime. (Patient not taking: Reported on 10/26/2021), Disp: , Rfl:  ?  ibuprofen (ADVIL) 800 MG tablet, Take 1 tablet (800 mg total) by mouth every 8 (eight) hours as needed for moderate pain or cramping. (Patient not taking: Reported on 06/23/2020), Disp: 30 tablet, Rfl: 0 ?  ondansetron (ZOFRAN-ODT) 8  MG disintegrating tablet, Take 1 tablet (8 mg total) by mouth every 8 (eight) hours as needed for nausea or vomiting. (Patient not taking: Reported on 06/23/2020), Disp: 20 tablet, Rfl: 0 ?  PARAGARD INTRAUTERINE COPPER IUD IUD, 1 each by Intrauterine route once. (Patient not taking: Reported on 06/23/2020), Disp: , Rfl:  ?Medication Side Effects: none ? ?Family Medical/ Social History: Changes? no ? ?MENTAL HEALTH EXAM: ? ?There were no vitals taken for this visit.There is no height or weight on file  to calculate BMI.  ?General Appearance: Casual and Well Groomed  ?Eye Contact:  Good  ?Speech:  Clear and Coherent and Normal Rate  ?Volume:  Normal  ?Mood:  Euthymic  ?Affect:  Congruent  ?Thought Process:  Goal Directed and Descriptions of Associations: Circumstantial  ?Orientation:  Full (Time, Place, and Person)  ?Thought Content: Logical   ?Suicidal Thoughts:  No  ?Homicidal Thoughts:  No  ?Memory:  WNL  ?Judgement:  Good  ?Insight:  Good  ?Psychomotor Activity:  Normal  ?Concentration:  Concentration: Good and Attention Span: Good  ?Recall:  Good  ?Fund of Knowledge: Good  ?Language: Good  ?Assets:  Desire for Improvement  ?ADL's:  Intact  ?Cognition: WNL  ?Prognosis:  Good  ? ? ?DIAGNOSES:  ?  ICD-10-CM   ?1. Major depressive disorder, recurrent episode, moderate (HCC)  F33.1   ?  ?2. Generalized anxiety disorder  F41.1   ?  ?3. PTSD (post-traumatic stress disorder)  F43.10   ?  ? ? ? ?Receiving Psychotherapy: No  ? ? ?RECOMMENDATIONS:  ?PDMP was reviewed.  Last Xanax filled  11/28/2021. ?I provided 20 minutes of face-to-face time during this encounter, including time spent before and after the visit in records review, medical decision making, counseling pertinent to today's visit, and charting.  ?We discussed different options to help with fatigue, energy, and anxiety.  She took Cymbalta in the past which did help some, she was not able to tolerate it at 90 mg though.  She would like to retry it. ? ?Restart Cymbalta 30 mg, 1 p.o. every morning for 2 weeks then increase to 60 mg daily. ?Continue Xanax 1 mg p.o. twice daily as needed.   ?Continue hydroxyzine 10 mg, 1-2 every 8 hours as needed anxiety. ?Recommend counseling. ?Return in 6 weeks. ? ?Melony Overly, PA-C  ?

## 2022-01-23 ENCOUNTER — Ambulatory Visit: Payer: BLUE CROSS/BLUE SHIELD | Admitting: Physician Assistant

## 2022-02-06 DIAGNOSIS — R600 Localized edema: Secondary | ICD-10-CM | POA: Insufficient documentation

## 2022-03-03 ENCOUNTER — Other Ambulatory Visit: Payer: Self-pay | Admitting: Physician Assistant

## 2022-03-04 NOTE — Telephone Encounter (Signed)
Filled 01/17/22

## 2022-03-10 ENCOUNTER — Encounter (HOSPITAL_COMMUNITY): Payer: Self-pay | Admitting: *Deleted

## 2022-03-10 ENCOUNTER — Other Ambulatory Visit: Payer: Self-pay

## 2022-03-10 ENCOUNTER — Emergency Department (HOSPITAL_COMMUNITY): Payer: BLUE CROSS/BLUE SHIELD

## 2022-03-10 ENCOUNTER — Emergency Department (HOSPITAL_COMMUNITY)
Admission: EM | Admit: 2022-03-10 | Discharge: 2022-03-10 | Disposition: A | Discharge status: Home / Self Care | Payer: BLUE CROSS/BLUE SHIELD | Attending: Emergency Medicine | Admitting: Emergency Medicine

## 2022-03-10 DIAGNOSIS — R1031 Right lower quadrant pain: Secondary | ICD-10-CM | POA: Diagnosis present

## 2022-03-10 DIAGNOSIS — K5900 Constipation, unspecified: Secondary | ICD-10-CM | POA: Insufficient documentation

## 2022-03-10 DIAGNOSIS — R110 Nausea: Secondary | ICD-10-CM | POA: Diagnosis not present

## 2022-03-10 LAB — CBC
HCT: 41.5 % (ref 36.0–46.0)
Hemoglobin: 14.2 g/dL (ref 12.0–15.0)
MCH: 31.8 pg (ref 26.0–34.0)
MCHC: 34.2 g/dL (ref 30.0–36.0)
MCV: 93 fL (ref 80.0–100.0)
Platelets: 237 10*3/uL (ref 150–400)
RBC: 4.46 MIL/uL (ref 3.87–5.11)
RDW: 12 % (ref 11.5–15.5)
WBC: 12 10*3/uL — ABNORMAL HIGH (ref 4.0–10.5)
nRBC: 0 % (ref 0.0–0.2)

## 2022-03-10 LAB — COMPREHENSIVE METABOLIC PANEL
ALT: 40 U/L (ref 0–44)
AST: 25 U/L (ref 15–41)
Albumin: 3.9 g/dL (ref 3.5–5.0)
Alkaline Phosphatase: 52 U/L (ref 38–126)
Anion gap: 7 (ref 5–15)
BUN: 12 mg/dL (ref 6–20)
CO2: 23 mmol/L (ref 22–32)
Calcium: 8.3 mg/dL — ABNORMAL LOW (ref 8.9–10.3)
Chloride: 107 mmol/L (ref 98–111)
Creatinine, Ser: 0.69 mg/dL (ref 0.44–1.00)
GFR, Estimated: >60 mL/min (ref >60)
Glucose, Bld: 88 mg/dL (ref 70–99)
Potassium: 3.9 mmol/L (ref 3.5–5.1)
Sodium: 137 mmol/L (ref 135–145)
Total Bilirubin: 0.3 mg/dL (ref 0.3–1.2)
Total Protein: 6.5 g/dL (ref 6.5–8.1)

## 2022-03-10 LAB — URINALYSIS, ROUTINE W REFLEX MICROSCOPIC
Bilirubin Urine: NEGATIVE
Glucose, UA: NEGATIVE mg/dL
Hgb urine dipstick: NEGATIVE
Ketones, ur: NEGATIVE mg/dL
Nitrite: NEGATIVE
Protein, ur: 30 mg/dL — AB
Specific Gravity, Urine: 1.036 — ABNORMAL HIGH (ref 1.005–1.030)
pH: 5 (ref 5.0–8.0)

## 2022-03-10 LAB — I-STAT BETA HCG BLOOD, ED (MC, WL, AP ONLY): I-stat hCG, quantitative: <5 m[IU]/mL (ref <5)

## 2022-03-10 LAB — LIPASE, BLOOD: Lipase: 29 U/L (ref 11–51)

## 2022-03-10 MED ORDER — MORPHINE SULFATE (PF) 4 MG/ML IV SOLN
4.0000 mg | Freq: Once | INTRAVENOUS | Status: AC
  Administered 2022-03-10: 4 mg via INTRAVENOUS
  Filled 2022-03-10: qty 1

## 2022-03-10 MED ORDER — ONDANSETRON HCL 4 MG/2ML IJ SOLN
4.0000 mg | Freq: Once | INTRAMUSCULAR | Status: AC
  Administered 2022-03-10: 4 mg via INTRAVENOUS
  Filled 2022-03-10: qty 2

## 2022-03-10 MED ORDER — IOHEXOL 300 MG/ML  SOLN
100.0000 mL | Freq: Once | INTRAMUSCULAR | Status: AC | PRN
  Administered 2022-03-10: 100 mL via INTRAVENOUS

## 2022-03-10 MED ORDER — SODIUM CHLORIDE 0.9 % IV BOLUS
1000.0000 mL | Freq: Once | INTRAVENOUS | Status: AC
  Administered 2022-03-10: 1000 mL via INTRAVENOUS

## 2022-03-10 NOTE — ED Triage Notes (Signed)
Pt states right lower abdominal pain that goes to the left side, back pain, and nausea. Started about 30 minutes

## 2022-03-10 NOTE — ED Provider Notes (Signed)
[facility] EMERGENCY DEPARTMENT Provider Note   CSN: [id number] Arrival date & time: 03/10/22  0042     History  Chief Complaint  Patient presents with   Abdominal Pain    [name] is a 36 y.o. female.  Patient presents to the emergency department for evaluation of abdominal pain.  Patient reports that pain started approximately an hour ago.  Pain hit her fairly suddenly.  Pain is on the right side of her abdomen and pelvic area, into her back.  Pain does radiate across to the left side.  Patient has nausea but no vomiting.  No urinary symptoms.       Home Medications Prior to Admission medications   Medication Sig Start Date End Date Taking? Authorizing Provider  ALPRAZolam Prudy Feeler) 1 MG tablet Take 1 tablet (1 mg total) by mouth 2 (two) times daily as needed. for anxiety 12/12/21   Melony Overly T, PA-C  desvenlafaxine (PRISTIQ) 50 MG 24 hr tablet Take 1 tablet (50 mg total) by mouth daily. Patient not taking: Reported on 12/12/2021 10/26/21   Melony Overly T, PA-C  DULoxetine (CYMBALTA) 30 MG capsule Take 1 capsule (30 mg total) by mouth daily. 12/12/21   Cherie Ouch, PA-C  DULoxetine (CYMBALTA) 60 MG capsule Take 1 capsule (60 mg total) by mouth daily. Start after 2 weeks of the 30 mg. 12/12/21   Cherie Ouch, PA-C  ESTARYLLA 0.25-35 MG-MCG tablet Take 1 tablet by mouth daily. 10/23/21   [provider]  gabapentin (NEURONTIN) 600 MG tablet Take 600 mg by mouth at bedtime. Patient not taking: Reported on 10/26/2021 04/15/20   [provider]  HYDROmorphone (DILAUDID) 4 MG tablet Take 1 tablet (4 mg total) by mouth every 4 (four) hours as needed for severe pain. 02/15/20   Hoover Browns, MD  hydrOXYzine (ATARAX/VISTARIL) 10 MG tablet TAKE 1 TABLET BY MOUTH EVERY 6 HOURS AS NEEDED 05/24/21   Melony Overly T, PA-C  ibuprofen (ADVIL) 800 MG tablet Take 1 tablet (800 mg total) by mouth every 8 (eight) hours as needed for moderate pain or cramping. Patient not  taking: Reported on 06/23/2020 02/15/20   Hoover Browns, MD  ondansetron (ZOFRAN ODT) 4 MG disintegrating tablet Take 1 tablet (4 mg total) by mouth every 8 (eight) hours as needed for nausea or vomiting. 02/03/20   Fondaw, Rodrigo Ran, PA  ondansetron (ZOFRAN-ODT) 8 MG disintegrating tablet Take 1 tablet (8 mg total) by mouth every 8 (eight) hours as needed for nausea or vomiting. Patient not taking: Reported on 06/23/2020 02/15/20   Hoover Browns, MD  [facility] INTRAUTERINE COPPER IUD IUD 1 each by Intrauterine route once. Patient not taking: Reported on 06/23/2020    [provider]      Allergies    Penicillins, Shrimp [shellfish allergy], Eggs or egg-derived products, Soy allergy, and Wheat bran    Review of Systems   Review of Systems  Physical Exam Updated Vital Signs BP 113/71   Pulse 83   Temp 98.4 F (36.9 C) (Oral)   Resp 15   Ht 5\' 4"  (1.626 m)   Wt 68 kg   LMP 01/24/2022   SpO2 97%   BMI 25.75 kg/m  Physical Exam Vitals and nursing note reviewed.  Constitutional:      General: She is not in acute distress.    Appearance: She is well-developed.  HENT:     Head: Normocephalic and atraumatic.     Mouth/Throat:     Mouth: Mucous  membranes are moist.  Eyes:     General: Vision grossly intact. Gaze aligned appropriately.     Extraocular Movements: Extraocular movements intact.     Conjunctiva/sclera: Conjunctivae normal.  Cardiovascular:     Rate and Rhythm: Normal rate and regular rhythm.     Pulses: Normal pulses.     Heart sounds: Normal heart sounds, S1 normal and S2 normal. No murmur heard.    No friction rub. No gallop.  Pulmonary:     Effort: Pulmonary effort is normal. No respiratory distress.     Breath sounds: Normal breath sounds.  Abdominal:     General: Bowel sounds are normal.     Palpations: Abdomen is soft.     Tenderness: There is abdominal tenderness in the right lower quadrant. There is no guarding or rebound.     Hernia: No hernia is present.   Musculoskeletal:        General: No swelling.     Cervical back: Full passive range of motion without pain, normal range of motion and neck supple. No spinous process tenderness or muscular tenderness. Normal range of motion.     Right lower leg: No edema.     Left lower leg: No edema.  Skin:    General: Skin is warm and dry.     Capillary Refill: Capillary refill takes less than 2 seconds.     Findings: No ecchymosis, erythema, rash or wound.  Neurological:     General: No focal deficit present.     Mental Status: She is alert and oriented to person, place, and time.     GCS: GCS eye subscore is 4. GCS verbal subscore is 5. GCS motor subscore is 6.     Cranial Nerves: Cranial nerves 2-12 are intact.     Sensory: Sensation is intact.     Motor: Motor function is intact.     Coordination: Coordination is intact.  Psychiatric:        Attention and Perception: Attention normal.        Mood and Affect: Mood normal.        Speech: Speech normal.        Behavior: Behavior normal.     ED Results / Procedures / Treatments   Labs (all labs ordered are listed, but only abnormal results are displayed) Labs Reviewed  COMPREHENSIVE METABOLIC PANEL - Abnormal; Notable for the following components:      Result Value   Calcium 8.3 (*)    All other components within normal limits  CBC - Abnormal; Notable for the following components:   WBC 12.0 (*)    All other components within normal limits  URINALYSIS, ROUTINE W REFLEX MICROSCOPIC - Abnormal; Notable for the following components:   APPearance TURBID (*)    Specific Gravity, Urine 1.036 (*)    Protein, ur 30 (*)    Leukocytes,Ua MODERATE (*)    Bacteria, UA FEW (*)    All other components within normal limits  LIPASE, BLOOD  I-STAT BETA HCG BLOOD, ED ([facility], [facility], [facility] ONLY)  GC/CHLAMYDIA PROBE AMP ([place]) NOT AT [facility]    EKG None  Radiology CT ABDOMEN PELVIS W CONTRAST  Result Date: 03/10/2022 CLINICAL DATA:  Flank pain.   Kidney stone suspected. EXAM: CT ABDOMEN AND PELVIS WITH CONTRAST TECHNIQUE: Multidetector CT imaging of the abdomen and pelvis was performed using the standard protocol following bolus administration of intravenous contrast. RADIATION DOSE REDUCTION: This exam was performed according to the departmental dose-optimization program  which includes automated exposure control, adjustment of the mA and/or kV according to patient size and/or use of iterative reconstruction technique. CONTRAST:  OMNIPAQUE IOHEXOL 300 MG/ML  SOLN COMPARISON:  CTs with IV and oral contrast 06/29/2021 and 04/04/2020. FINDINGS: Lower chest: No acute abnormality. Hepatobiliary: No liver mass is seen no focal abnormality in the gallbladder and bile ducts. Pancreas: Unremarkable. Spleen: No focal abnormality or splenomegaly. Adrenals/Urinary Tract: There is no adrenal or renal cortical mass, no hydronephrosis or stone disease. The bladder is unremarkable for the degree of distention. Stomach/Bowel: Unremarkable stomach and proximal small bowel. Mildly dilated mid small bowel loops are noted in the left upper to central abdomen extending to the right, maximum caliber 2.8 cm. A discrete transitional segment is not seen. The appendix again noted surgically absent. There is moderate to severe stool retention ascending colon, mobile cecum located in the right pelvis. No dilatation or wall thickening in the colon. The lower abdominal and pelvic small bowel is collapsed. Vascular/Lymphatic: No significant vascular findings are present. No enlarged abdominal or pelvic lymph nodes. Reproductive: The uterus is intact. Right ovary is unremarkable. There is a 2.8 cm thin walled dominant follicle of the left ovary. A nabothian cyst is again seen in the cervix to the right. Next Other: There is a small supraumbilical midline anterior wall fat hernia. No incarcerated hernia is seen. There is no free air, hemorrhage or fluid. Musculoskeletal: No acute or  significant osseous findings. IMPRESSION: 1. Mild dilatation of mid small bowel segments up to 2.8 cm with distal decompression and no visible transitional segment. Findings consistent with low-grade small-bowel obstruction, etiology unknown possibly occult adhesions, enteritis or occult internal hernia. 2. Moderate to severe stool retention ascending colon with mobile cecum. Old appendectomy. 3. Small supraumbilical midline anterior wall fat hernia. Electronically Signed   By: Almira Bar M.D.   On: 03/10/2022 03:53    Procedures Procedures    Medications Ordered in ED Medications  sodium chloride 0.9 % bolus 1,000 mL (0 mLs Intravenous Stopped 03/10/22 0411)  morphine (PF) 4 MG/ML injection 4 mg (4 mg Intravenous Given 03/10/22 0141)  ondansetron (ZOFRAN) injection 4 mg (4 mg Intravenous Given 03/10/22 0141)  iohexol (OMNIPAQUE) 300 MG/ML solution 100 mL (100 mLs Intravenous Contrast Given 03/10/22 0329)    ED Course/ Medical Decision Making/ A&P                           Medical Decision Making Amount and/or Complexity of Data Reviewed Labs: ordered. Radiology: ordered.  Risk Prescription drug management.   Patient presents to the emergency department for evaluation of right lower abdominal pain that radiates over into the left side as well as around into the back.  Patient reports extensive history of recurrent abdominal pain.  She has had multiple surgeries and has had adhesions noted.  Examination revealed tenderness in the right lower quadrant.  She has had a prior appendectomy, however.  There was also tenderness on the left side, no obvious peritonitis.  Pelvic exam did not reveal any masses.  No cervical motion tenderness.  Previous records did indicate a history of hospitalization for PID but exam does not support this diagnosis currently.  CT scan was performed.  Patient has significant stool retention in the right colon which may explain the majority of her symptoms.  There  are some mildly dilated small bowel loops with no obvious transition zone.  No adnexal abnormality on the right. Cannot  rule out partial small bowel obstruction.   Patient has not had any nausea or vomiting, appears comfortable here.  I did give her the option of hospitalization versus discharge with clear liquids and prompt return for worsening symptoms.  She is comfortable being discharged.  She will return for any fever, increased pain, nausea or vomiting.        Final Clinical Impression(s) / ED Diagnoses Final diagnoses:  Constipation, unspecified constipation type    Rx / DC Orders ED Discharge Orders     None         Gilda Crease, MD 03/10/22 [phone]

## 2022-03-10 NOTE — Discharge Instructions (Signed)
Clear liquid diet today.  Advance as tolerated starting tomorrow.  If you develop fever, increasing pain, recurrent nausea and vomiting, return to the ER for repeat evaluation.  Trial of laxative of your choice today to help with constipation.

## 2022-03-11 ENCOUNTER — Other Ambulatory Visit: Payer: Self-pay

## 2022-03-11 ENCOUNTER — Telehealth: Payer: Self-pay | Admitting: Physician Assistant

## 2022-03-11 LAB — GC/CHLAMYDIA PROBE AMP (~~LOC~~) NOT AT ARMC
Chlamydia: NEGATIVE
Comment: NEGATIVE
Comment: NORMAL
Neisseria Gonorrhea: NEGATIVE

## 2022-03-11 MED ORDER — ALPRAZOLAM 1 MG PO TABS: 1.0000 mg | ORAL_TABLET | Freq: Two times a day (BID) | ORAL | 0 refills | Status: DC | PRN

## 2022-03-11 MED ORDER — HYDROXYZINE HCL 10 MG PO TABS: 10.0000 mg | ORAL_TABLET | Freq: Four times a day (QID) | ORAL | 0 refills | Status: DC | PRN

## 2022-03-11 NOTE — Telephone Encounter (Signed)
Pended.

## 2022-03-11 NOTE — Telephone Encounter (Signed)
Pt called at 4:55 pm . She made an appt for 7/7. She needs refills on her alprazolam 1 mg.  and her hydroxyzine 10 mg. Pharmacy is [facility] on [address]  in [place]

## 2022-03-21 ENCOUNTER — Encounter (HOSPITAL_COMMUNITY): Payer: Self-pay | Admitting: *Deleted

## 2022-03-21 ENCOUNTER — Emergency Department (HOSPITAL_COMMUNITY): Payer: BLUE CROSS/BLUE SHIELD

## 2022-03-21 ENCOUNTER — Emergency Department (HOSPITAL_COMMUNITY)
Admission: EM | Admit: 2022-03-21 | Discharge: 2022-03-22 | Disposition: A | Discharge status: Home / Self Care | Payer: BLUE CROSS/BLUE SHIELD | Attending: Emergency Medicine | Admitting: Emergency Medicine

## 2022-03-21 ENCOUNTER — Other Ambulatory Visit: Payer: Self-pay

## 2022-03-21 DIAGNOSIS — D72829 Elevated white blood cell count, unspecified: Secondary | ICD-10-CM | POA: Insufficient documentation

## 2022-03-21 DIAGNOSIS — R1084 Generalized abdominal pain: Secondary | ICD-10-CM | POA: Diagnosis present

## 2022-03-21 DIAGNOSIS — R112 Nausea with vomiting, unspecified: Secondary | ICD-10-CM | POA: Insufficient documentation

## 2022-03-21 DIAGNOSIS — H66002 Acute suppurative otitis media without spontaneous rupture of ear drum, left ear: Secondary | ICD-10-CM

## 2022-03-21 DIAGNOSIS — K5900 Constipation, unspecified: Secondary | ICD-10-CM

## 2022-03-21 LAB — CBC
HCT: 45.4 % (ref 36.0–46.0)
Hemoglobin: 15.5 g/dL — ABNORMAL HIGH (ref 12.0–15.0)
MCH: 32.2 pg (ref 26.0–34.0)
MCHC: 34.1 g/dL (ref 30.0–36.0)
MCV: 94.2 fL (ref 80.0–100.0)
Platelets: 283 10*3/uL (ref 150–400)
RBC: 4.82 MIL/uL (ref 3.87–5.11)
RDW: 12.2 % (ref 11.5–15.5)
WBC: 16.5 10*3/uL — ABNORMAL HIGH (ref 4.0–10.5)
nRBC: 0 % (ref 0.0–0.2)

## 2022-03-21 LAB — COMPREHENSIVE METABOLIC PANEL
ALT: 32 U/L (ref 0–44)
AST: 24 U/L (ref 15–41)
Albumin: 4.3 g/dL (ref 3.5–5.0)
Alkaline Phosphatase: 66 U/L (ref 38–126)
Anion gap: 7 (ref 5–15)
BUN: 13 mg/dL (ref 6–20)
CO2: 26 mmol/L (ref 22–32)
Calcium: 9 mg/dL (ref 8.9–10.3)
Chloride: 106 mmol/L (ref 98–111)
Creatinine, Ser: 0.82 mg/dL (ref 0.44–1.00)
GFR, Estimated: >60 mL/min (ref >60)
Glucose, Bld: 105 mg/dL — ABNORMAL HIGH (ref 70–99)
Potassium: 4.3 mmol/L (ref 3.5–5.1)
Sodium: 139 mmol/L (ref 135–145)
Total Bilirubin: 0.9 mg/dL (ref 0.3–1.2)
Total Protein: 7.5 g/dL (ref 6.5–8.1)

## 2022-03-21 LAB — URINALYSIS, ROUTINE W REFLEX MICROSCOPIC
Bilirubin Urine: NEGATIVE
Glucose, UA: NEGATIVE mg/dL
Hgb urine dipstick: NEGATIVE
Ketones, ur: 5 mg/dL — AB
Leukocytes,Ua: NEGATIVE
Nitrite: NEGATIVE
Protein, ur: 100 mg/dL — AB
Specific Gravity, Urine: 1.023 (ref 1.005–1.030)
pH: 5 (ref 5.0–8.0)

## 2022-03-21 LAB — PREGNANCY, URINE: Preg Test, Ur: NEGATIVE

## 2022-03-21 LAB — LIPASE, BLOOD: Lipase: 30 U/L (ref 11–51)

## 2022-03-21 MED ORDER — SODIUM CHLORIDE 0.9 % IV BOLUS
1000.0000 mL | Freq: Once | INTRAVENOUS | Status: AC
  Administered 2022-03-22: 1000 mL via INTRAVENOUS

## 2022-03-21 MED ORDER — MORPHINE SULFATE (PF) 4 MG/ML IV SOLN
4.0000 mg | Freq: Once | INTRAVENOUS | Status: AC
  Administered 2022-03-22: 4 mg via INTRAVENOUS
  Filled 2022-03-21: qty 1

## 2022-03-21 MED ORDER — IOHEXOL 300 MG/ML  SOLN
100.0000 mL | Freq: Once | INTRAMUSCULAR | Status: AC | PRN
  Administered 2022-03-22: 100 mL via INTRAVENOUS

## 2022-03-21 MED ORDER — ONDANSETRON HCL 4 MG/2ML IJ SOLN
4.0000 mg | Freq: Once | INTRAMUSCULAR | Status: AC
  Administered 2022-03-22: 4 mg via INTRAVENOUS
  Filled 2022-03-21: qty 2

## 2022-03-21 NOTE — ED Provider Notes (Signed)
[facility] EMERGENCY DEPARTMENT Provider Note   CSN: [id number] Arrival date & time: 03/21/22  2141     History  Chief Complaint  Patient presents with   Abdominal Pain    Alexandra HALTIWANGER is a 36 y.o. female.  Patient is a 36 year old female with past medical history of prior appendectomy with surgical adhesions, irritable bowel, chronic constipation.  Patient presenting today with complaints of generalized abdominal pain, nausea, and vomiting.  She was seen here 2 weeks ago and diagnosed with an early small bowel obstruction.  She was given the option of admission, but declined.  She has been having intermittent discomfort and vomiting since, but this worsened today.  She denies any fevers or chills.  She denies any bloody stool or vomit.  She is followed by a gastroenterologist at [facility].  The history is provided by the patient.       Home Medications Prior to Admission medications   Medication Sig Start Date End Date Taking? Authorizing Provider  ALPRAZolam Prudy Feeler) 1 MG tablet Take 1 tablet (1 mg total) by mouth 2 (two) times daily as needed. for anxiety 03/11/22   Melony Overly T, PA-C  desvenlafaxine (PRISTIQ) 50 MG 24 hr tablet Take 1 tablet (50 mg total) by mouth daily. Patient not taking: Reported on 12/12/2021 10/26/21   Melony Overly T, PA-C  DULoxetine (CYMBALTA) 30 MG capsule Take 1 capsule (30 mg total) by mouth daily. 12/12/21   Cherie Ouch, PA-C  DULoxetine (CYMBALTA) 60 MG capsule Take 1 capsule (60 mg total) by mouth daily. Start after 2 weeks of the 30 mg. 12/12/21   Cherie Ouch, PA-C  ESTARYLLA 0.25-35 MG-MCG tablet Take 1 tablet by mouth daily. 10/23/21   [provider]  gabapentin (NEURONTIN) 600 MG tablet Take 600 mg by mouth at bedtime. Patient not taking: Reported on 10/26/2021 04/15/20   [provider]  HYDROmorphone (DILAUDID) 4 MG tablet Take 1 tablet (4 mg total) by mouth every 4 (four) hours as needed for severe pain. 02/15/20   Hoover Browns, MD  hydrOXYzine (ATARAX) 10 MG tablet Take 1 tablet (10 mg total) by mouth every 6 (six) hours as needed. 03/11/22   Cherie Ouch, PA-C  ibuprofen (ADVIL) 800 MG tablet Take 1 tablet (800 mg total) by mouth every 8 (eight) hours as needed for moderate pain or cramping. Patient not taking: Reported on 06/23/2020 02/15/20   Hoover Browns, MD  ondansetron (ZOFRAN ODT) 4 MG disintegrating tablet Take 1 tablet (4 mg total) by mouth every 8 (eight) hours as needed for nausea or vomiting. 02/03/20   Fondaw, Rodrigo Ran, PA  ondansetron (ZOFRAN-ODT) 8 MG disintegrating tablet Take 1 tablet (8 mg total) by mouth every 8 (eight) hours as needed for nausea or vomiting. Patient not taking: Reported on 06/23/2020 02/15/20   Hoover Browns, MD  [facility] INTRAUTERINE COPPER IUD IUD 1 each by Intrauterine route once. Patient not taking: Reported on 06/23/2020    [provider]      Allergies    Penicillins, Shrimp [shellfish allergy], Eggs or egg-derived products, Soy allergy, and Wheat bran    Review of Systems   Review of Systems  All other systems reviewed and are negative.   Physical Exam Updated Vital Signs BP 122/90   Pulse 87   Temp 98 F (36.7 C) (Oral)   Resp 20   Ht 5\' 4"  (1.626 m)   Wt 68 kg   LMP 01/24/2022   SpO2 98%  BMI 25.73 kg/m  Physical Exam Vitals and nursing note reviewed.  Constitutional:      General: She is not in acute distress.    Appearance: She is well-developed. She is not diaphoretic.  HENT:     Head: Normocephalic and atraumatic.  Cardiovascular:     Rate and Rhythm: Normal rate and regular rhythm.     Heart sounds: No murmur heard.    No friction rub. No gallop.  Pulmonary:     Effort: Pulmonary effort is normal. No respiratory distress.     Breath sounds: Normal breath sounds. No wheezing.  Abdominal:     General: Bowel sounds are normal. There is no distension.     Palpations: Abdomen is soft.     Tenderness: There is generalized abdominal  tenderness. There is no right CVA tenderness, left CVA tenderness, guarding or rebound.  Musculoskeletal:        General: Normal range of motion.     Cervical back: Normal range of motion and neck supple.  Skin:    General: Skin is warm and dry.  Neurological:     General: No focal deficit present.     Mental Status: She is alert and oriented to person, place, and time.     ED Results / Procedures / Treatments   Labs (all labs ordered are listed, but only abnormal results are displayed) Labs Reviewed  COMPREHENSIVE METABOLIC PANEL - Abnormal; Notable for the following components:      Result Value   Glucose, Bld 105 (*)    All other components within normal limits  CBC - Abnormal; Notable for the following components:   WBC 16.5 (*)    Hemoglobin 15.5 (*)    All other components within normal limits  URINALYSIS, ROUTINE W REFLEX MICROSCOPIC - Abnormal; Notable for the following components:   APPearance HAZY (*)    Ketones, ur 5 (*)    Protein, ur 100 (*)    Bacteria, UA RARE (*)    All other components within normal limits  LIPASE, BLOOD  PREGNANCY, URINE    EKG None  Radiology No results found.  Procedures Procedures    Medications Ordered in ED Medications  sodium chloride 0.9 % bolus 1,000 mL (has no administration in time range)  morphine (PF) 4 MG/ML injection 4 mg (has no administration in time range)  ondansetron (ZOFRAN) injection 4 mg (has no administration in time range)    ED Course/ Medical Decision Making/ A&P  Patient presenting here with complaints of abdominal pain as described in the HPI.  She has history of chronic abdominal pain and chronic constipation and is followed by gastroenterologist at [facility].  She presents today with worsening symptoms.  2 weeks ago a CT scan suggested a small bowel obstruction, but patient was discharged and this seems to have resolved.  Her CT scan shows no evidence for this bowel obstruction, and only shows  constipation.  Laboratory studies are reassuring.  She does have a white count of 16,000, however this appears to be chronically elevated.  I feel as though patient can safely be discharged.  I will advise her to take magnesium citrate and follow-up with her GI doctors in the near future.  Final Clinical Impression(s) / ED Diagnoses Final diagnoses:  None    Rx / DC Orders ED Discharge Orders     None         Geoffery Lyons, MD 03/22/22 [phone]

## 2022-03-21 NOTE — ED Triage Notes (Signed)
Pt with abd pain with nausea x 2 weeks.  Emesis started this afternoon.  Recent visit with dx'd SBO Recent sinus infection

## 2022-03-22 MED ORDER — AZITHROMYCIN 250 MG PO TABS
500.0000 mg | ORAL_TABLET | Freq: Once | ORAL | Status: AC
Start: 2022-03-22 — End: 2022-03-22
  Administered 2022-03-22: 500 mg via ORAL
  Filled 2022-03-22: qty 2

## 2022-03-22 MED ORDER — AZITHROMYCIN 250 MG PO TABS: 250.0000 mg | ORAL_TABLET | Freq: Every day | ORAL | 0 refills | Status: DC

## 2022-03-22 NOTE — Discharge Instructions (Signed)
Begin taking Zithromax as prescribed.  Take ibuprofen 600 mg every 6 hours as needed for pain.  Try using magnesium citrate.  This medication is available in a 10 ounce bottle over-the-counter.  You should drink the entire bottle and this should help to relieve your constipation.  Follow-up with your gastroenterologist if not improving in the next week.

## 2022-03-22 NOTE — ED Notes (Signed)
Patient transported to CT 

## 2022-03-29 ENCOUNTER — Telehealth: Payer: Self-pay | Admitting: Physician Assistant

## 2022-03-29 ENCOUNTER — Ambulatory Visit (INDEPENDENT_AMBULATORY_CARE_PROVIDER_SITE_OTHER): Payer: Self-pay | Admitting: Physician Assistant

## 2022-03-29 ENCOUNTER — Other Ambulatory Visit: Payer: Self-pay

## 2022-03-29 DIAGNOSIS — Z91199 Patient's noncompliance with other medical treatment and regimen due to unspecified reason: Secondary | ICD-10-CM

## 2022-03-29 MED ORDER — GABAPENTIN 600 MG PO TABS: 600.0000 mg | ORAL_TABLET | Freq: Every day | ORAL | 0 refills | Status: DC

## 2022-03-29 MED ORDER — ALPRAZOLAM 1 MG PO TABS
1.0000 mg | ORAL_TABLET | Freq: Two times a day (BID) | ORAL | 0 refills | Status: DC | PRN
Start: 2022-03-29 — End: 2022-04-29

## 2022-03-29 NOTE — Telephone Encounter (Signed)
LVM to [facility]. ? Who prescribes gabapentin.

## 2022-03-29 NOTE — Telephone Encounter (Signed)
Pended.

## 2022-03-29 NOTE — Progress Notes (Signed)
No show for appt. 

## 2022-03-29 NOTE — Telephone Encounter (Signed)
Pt would like refill of Gabapentin and Xanax sent to  [id number] [place], [place] - [address] AT [facility] [address]  [address], [place] [address]  Phone:  [phone]  Fax:  [phone]

## 2022-04-02 DIAGNOSIS — G43909 Migraine, unspecified, not intractable, without status migrainosus: Secondary | ICD-10-CM | POA: Insufficient documentation

## 2022-04-02 DIAGNOSIS — J329 Chronic sinusitis, unspecified: Secondary | ICD-10-CM

## 2022-04-02 DIAGNOSIS — K219 Gastro-esophageal reflux disease without esophagitis: Secondary | ICD-10-CM | POA: Insufficient documentation

## 2022-04-02 HISTORY — DX: Migraine, unspecified, not intractable, without status migrainosus: G43.909

## 2022-04-02 HISTORY — DX: Chronic sinusitis, unspecified: J32.9

## 2022-04-17 ENCOUNTER — Telehealth: Payer: BLUE CROSS/BLUE SHIELD | Admitting: Physician Assistant

## 2022-04-29 ENCOUNTER — Other Ambulatory Visit: Payer: Self-pay

## 2022-04-29 ENCOUNTER — Telehealth: Payer: Self-pay | Admitting: Physician Assistant

## 2022-04-29 MED ORDER — ALPRAZOLAM 1 MG PO TABS: 1.0000 mg | ORAL_TABLET | Freq: Two times a day (BID) | ORAL | 0 refills | Status: DC | PRN

## 2022-04-29 NOTE — Telephone Encounter (Signed)
No, b/c she's not keeping up with appts, I'm not sending in a month supply

## 2022-04-29 NOTE — Telephone Encounter (Signed)
Filled 7/7 for 15 tabs,the previous rx were the same quantity but she is requesting #60

## 2022-04-29 NOTE — Telephone Encounter (Signed)
Pt has an appt 9/6. She needs a refill on her xanax. She needs a quantity of 60 mg. Pharmacy is [facility] on [address] in [place], [place]

## 2022-05-11 ENCOUNTER — Other Ambulatory Visit: Payer: Self-pay | Admitting: Physician Assistant

## 2022-05-23 DIAGNOSIS — I7300 Raynaud's syndrome without gangrene: Secondary | ICD-10-CM | POA: Insufficient documentation

## 2022-05-24 ENCOUNTER — Emergency Department (HOSPITAL_COMMUNITY): Payer: BLUE CROSS/BLUE SHIELD

## 2022-05-24 ENCOUNTER — Other Ambulatory Visit: Payer: Self-pay

## 2022-05-24 ENCOUNTER — Emergency Department (HOSPITAL_COMMUNITY)
Admission: EM | Admit: 2022-05-24 | Discharge: 2022-05-24 | Disposition: A | Discharge status: Home / Self Care | Payer: BLUE CROSS/BLUE SHIELD | Attending: Emergency Medicine | Admitting: Emergency Medicine

## 2022-05-24 DIAGNOSIS — R102 Pelvic and perineal pain: Secondary | ICD-10-CM | POA: Diagnosis not present

## 2022-05-24 DIAGNOSIS — N83202 Unspecified ovarian cyst, left side: Secondary | ICD-10-CM

## 2022-05-24 DIAGNOSIS — R1032 Left lower quadrant pain: Secondary | ICD-10-CM | POA: Diagnosis present

## 2022-05-24 DIAGNOSIS — N3000 Acute cystitis without hematuria: Secondary | ICD-10-CM | POA: Diagnosis not present

## 2022-05-24 LAB — CBC WITH DIFFERENTIAL/PLATELET
Abs Immature Granulocytes: 0.03 10*3/uL (ref 0.00–0.07)
Basophils Absolute: 0.1 10*3/uL (ref 0.0–0.1)
Basophils Relative: 1 %
Eosinophils Absolute: 0.2 10*3/uL (ref 0.0–0.5)
Eosinophils Relative: 2 %
HCT: 39.4 % (ref 36.0–46.0)
Hemoglobin: 14 g/dL (ref 12.0–15.0)
Immature Granulocytes: 0 %
Lymphocytes Relative: 26 %
Lymphs Abs: 3.1 10*3/uL (ref 0.7–4.0)
MCH: 31.7 pg (ref 26.0–34.0)
MCHC: 35.5 g/dL (ref 30.0–36.0)
MCV: 89.3 fL (ref 80.0–100.0)
Monocytes Absolute: 0.6 10*3/uL (ref 0.1–1.0)
Monocytes Relative: 5 %
Neutro Abs: 7.7 10*3/uL (ref 1.7–7.7)
Neutrophils Relative %: 66 %
Platelets: 275 10*3/uL (ref 150–400)
RBC: 4.41 MIL/uL (ref 3.87–5.11)
RDW: 11.8 % (ref 11.5–15.5)
WBC: 11.8 10*3/uL — ABNORMAL HIGH (ref 4.0–10.5)
nRBC: 0 % (ref 0.0–0.2)

## 2022-05-24 LAB — COMPREHENSIVE METABOLIC PANEL
ALT: 15 U/L (ref 0–44)
AST: 16 U/L (ref 15–41)
Albumin: 3.6 g/dL (ref 3.5–5.0)
Alkaline Phosphatase: 59 U/L (ref 38–126)
Anion gap: 7 (ref 5–15)
BUN: 14 mg/dL (ref 6–20)
CO2: 19 mmol/L — ABNORMAL LOW (ref 22–32)
Calcium: 8.4 mg/dL — ABNORMAL LOW (ref 8.9–10.3)
Chloride: 112 mmol/L — ABNORMAL HIGH (ref 98–111)
Creatinine, Ser: 1.03 mg/dL — ABNORMAL HIGH (ref 0.44–1.00)
GFR, Estimated: >60 mL/min (ref >60)
Glucose, Bld: 102 mg/dL — ABNORMAL HIGH (ref 70–99)
Potassium: 3.6 mmol/L (ref 3.5–5.1)
Sodium: 138 mmol/L (ref 135–145)
Total Bilirubin: 0.4 mg/dL (ref 0.3–1.2)
Total Protein: 6.3 g/dL — ABNORMAL LOW (ref 6.5–8.1)

## 2022-05-24 LAB — URINALYSIS, ROUTINE W REFLEX MICROSCOPIC
Bilirubin Urine: NEGATIVE
Glucose, UA: NEGATIVE mg/dL
Ketones, ur: 5 mg/dL — AB
Nitrite: POSITIVE — AB
Protein, ur: 30 mg/dL — AB
Specific Gravity, Urine: 1.025 (ref 1.005–1.030)
WBC, UA: >50 WBC/hpf — ABNORMAL HIGH (ref 0–5)
pH: 5 (ref 5.0–8.0)

## 2022-05-24 LAB — I-STAT BETA HCG BLOOD, ED (MC, WL, AP ONLY): I-stat hCG, quantitative: <5 m[IU]/mL (ref <5)

## 2022-05-24 MED ORDER — CIPROFLOXACIN HCL 500 MG PO TABS
500.0000 mg | ORAL_TABLET | Freq: Once | ORAL | Status: AC
  Administered 2022-05-24: 500 mg via ORAL
  Filled 2022-05-24: qty 1

## 2022-05-24 MED ORDER — ONDANSETRON HCL 4 MG/2ML IJ SOLN
4.0000 mg | Freq: Once | INTRAMUSCULAR | Status: AC
  Administered 2022-05-24: 4 mg via INTRAVENOUS
  Filled 2022-05-24: qty 2

## 2022-05-24 MED ORDER — HYDROMORPHONE HCL 1 MG/ML IJ SOLN
1.0000 mg | Freq: Once | INTRAMUSCULAR | Status: AC
  Administered 2022-05-24: 1 mg via INTRAVENOUS
  Filled 2022-05-24: qty 1

## 2022-05-24 MED ORDER — KETOROLAC TROMETHAMINE 15 MG/ML IJ SOLN
15.0000 mg | Freq: Once | INTRAMUSCULAR | Status: AC
Start: 2022-05-24 — End: 2022-05-24
  Administered 2022-05-24: 15 mg via INTRAVENOUS
  Filled 2022-05-24: qty 1

## 2022-05-24 MED ORDER — CIPROFLOXACIN HCL 500 MG PO TABS: 500.0000 mg | ORAL_TABLET | Freq: Two times a day (BID) | ORAL | 0 refills | Status: AC

## 2022-05-24 NOTE — ED Provider Notes (Signed)
[facility] EMERGENCY DEPARTMENT Provider Note   CSN: [id number] Arrival date & time: 05/24/22  1442     History  Chief Complaint  Patient presents with   Abdominal Pain    [name] is a 36 y.o. female with a past medical history of anxiety and left-sided ovarian cyst presenting today with left lower quadrant pain.  She reports that she is due to have surgery on her ovarian cyst in 3 weeks however due to her anxiety and pain she called Dr. Sallye Ober who said that she needed to come to the emergency department and that surgery would be done today.  Patient reports no vaginal symptoms.  No dysuria or hematuria.  Says that the pain is left-sided and burning.  Also reporting some burning in her back.    Abdominal Pain Associated symptoms: nausea and vomiting   Associated symptoms: no dysuria, no hematuria, no vaginal bleeding and no vaginal discharge        Home Medications Prior to Admission medications   Medication Sig Start Date End Date Taking? Authorizing Provider  ALPRAZolam Prudy Feeler) 1 MG tablet Take 1 tablet (1 mg total) by mouth 2 (two) times daily as needed. for anxiety 04/29/22   Melony Overly T, PA-C  azithromycin (ZITHROMAX) 250 MG tablet Take 1 tablet (250 mg total) by mouth daily. 03/22/22   Geoffery Lyons, MD  desvenlafaxine (PRISTIQ) 50 MG 24 hr tablet Take 1 tablet (50 mg total) by mouth daily. Patient not taking: Reported on 12/12/2021 10/26/21   Melony Overly T, PA-C  DULoxetine (CYMBALTA) 30 MG capsule Take 1 capsule (30 mg total) by mouth daily. 12/12/21   Cherie Ouch, PA-C  DULoxetine (CYMBALTA) 60 MG capsule Take 1 capsule (60 mg total) by mouth daily. Start after 2 weeks of the 30 mg. 12/12/21   Cherie Ouch, PA-C  ESTARYLLA 0.25-35 MG-MCG tablet Take 1 tablet by mouth daily. 10/23/21   [provider]  gabapentin (NEURONTIN) 600 MG tablet Take 1 tablet (600 mg total) by mouth at bedtime. 03/29/22   Melony Overly T, PA-C  HYDROmorphone  (DILAUDID) 4 MG tablet Take 1 tablet (4 mg total) by mouth every 4 (four) hours as needed for severe pain. 02/15/20   Hoover Browns, MD  hydrOXYzine (ATARAX) 10 MG tablet TAKE 1 TABLET(10 MG) BY MOUTH EVERY 6 HOURS AS NEEDED 05/13/22   Melony Overly T, PA-C  ibuprofen (ADVIL) 800 MG tablet Take 1 tablet (800 mg total) by mouth every 8 (eight) hours as needed for moderate pain or cramping. Patient not taking: Reported on 06/23/2020 02/15/20   Hoover Browns, MD  ondansetron (ZOFRAN ODT) 4 MG disintegrating tablet Take 1 tablet (4 mg total) by mouth every 8 (eight) hours as needed for nausea or vomiting. 02/03/20   Fondaw, Rodrigo Ran, PA  ondansetron (ZOFRAN-ODT) 8 MG disintegrating tablet Take 1 tablet (8 mg total) by mouth every 8 (eight) hours as needed for nausea or vomiting. Patient not taking: Reported on 06/23/2020 02/15/20   Hoover Browns, MD  [facility] INTRAUTERINE COPPER IUD IUD 1 each by Intrauterine route once. Patient not taking: Reported on 06/23/2020    [provider]      Allergies    Penicillins, Shrimp [shellfish allergy], Eggs or egg-derived products, Soy allergy, and Wheat bran    Review of Systems   Review of Systems  Gastrointestinal:  Positive for abdominal pain, nausea and vomiting.  Genitourinary:  Negative for decreased urine volume, dysuria, frequency, hematuria, urgency, vaginal  bleeding, vaginal discharge and vaginal pain.    Physical Exam Updated Vital Signs BP 131/79   Pulse 89   Temp 97.6 F (36.4 C) (Oral)   Resp 16   LMP 03/25/2022 (Approximate)   SpO2 100%  Physical Exam Vitals and nursing note reviewed.  Constitutional:      General: She is not in acute distress.    Appearance: Normal appearance. She is not ill-appearing.  HENT:     Head: Normocephalic and atraumatic.  Eyes:     General: No scleral icterus.    Conjunctiva/sclera: Conjunctivae normal.  Pulmonary:     Effort: Pulmonary effort is normal. No respiratory distress.  Abdominal:      Tenderness: There is abdominal tenderness in the left lower quadrant. There is no right CVA tenderness or left CVA tenderness.     Hernia: No hernia is present.  Skin:    General: Skin is warm.     Findings: No rash.  Neurological:     Mental Status: She is alert.  Psychiatric:        Mood and Affect: Mood normal.     ED Results / Procedures / Treatments   Labs (all labs ordered are listed, but only abnormal results are displayed) Labs Reviewed  CBC WITH DIFFERENTIAL/PLATELET - Abnormal; Notable for the following components:      Result Value   WBC 11.8 (*)    All other components within normal limits  COMPREHENSIVE METABOLIC PANEL - Abnormal; Notable for the following components:   Chloride 112 (*)    CO2 19 (*)    Glucose, Bld 102 (*)    Creatinine, Ser 1.03 (*)    Calcium 8.4 (*)    Total Protein 6.3 (*)    All other components within normal limits  URINALYSIS, ROUTINE W REFLEX MICROSCOPIC - Abnormal; Notable for the following components:   APPearance HAZY (*)    Hgb urine dipstick SMALL (*)    Ketones, ur 5 (*)    Protein, ur 30 (*)    Nitrite POSITIVE (*)    Leukocytes,Ua MODERATE (*)    WBC, UA >50 (*)    Bacteria, UA FEW (*)    All other components within normal limits  I-STAT BETA HCG BLOOD, ED ([facility], [facility], [facility] ONLY)    EKG None  Radiology No results found.  Procedures Procedures   Medications Ordered in ED Medications  ciprofloxacin (CIPRO) tablet 500 mg (has no administration in time range)  HYDROmorphone (DILAUDID) injection 1 mg (1 mg Intravenous Given 05/24/22 1823)  ondansetron (ZOFRAN) injection 4 mg (4 mg Intravenous Given 05/24/22 1822)    ED Course/ Medical Decision Making/ A&P                           Medical Decision Making Amount and/or Complexity of Data Reviewed Radiology: ordered.  Risk Prescription drug management.   This is a 36 year old female who presents to the ED for concern of left pelvic pain.  Differential includes but is  not limited to ovarian torsion, ruptured ovarian cyst, ectopic pregnancy, cystitis, STD/PID/TOA.   This is not an exhaustive differential.    Past Medical History / Co-morbidities / Social History: Known left-sided ovarian cyst   Additional history: Obtained by patient's OB/GYN who states that the plan was to intervene outpatient.  She does report that she gave the patient a p.o. Dilaudid for her severe pain as well.   Physical Exam: Pertinent physical exam findings  include Left pelvic tenderness.  No CVA tenderness or abdominal tenderness  Lab Tests: I ordered, and personally interpreted labs.  The pertinent results include: UA with leukocyte and nitrite positive bacteriuria Leukocytosis 11.3 Relatively normal kidney function.  Creatinine 1.03   Imaging Studies: Per chart review, patient had a CT scan at the end of June which showed a 2.2 cm simple follicle to the left.  This was seen previously and measured at 2.8.    Medications: I ordered medication including.  IV Dilaudid and Zofran. Reevaluation of the patient after these medicines showed that the patient improved. I have reviewed the patients home medicines and have made adjustments as needed.   Consultations Obtained: I spoke with Dr. Sallye Ober with OB/GYN and she says that she does not intend to intervene emergently.  Suggested another ultrasound and if her pain is able to be controlled and the ultrasound is stable she can follow-up outpatient.  Dr. Sallye Ober spoke with the patient via my phone as well.  MDM/Disposition: This is a 36 year old female presenting today with pelvic pain.  Has a history of ovarian cyst with OB/GYN.  She thought that she was supposed to come here for surgery today however there appears to have been a miscommunication with her OB/GYN.  Dr. Sallye Ober spoke with the patient.  Decision was made to obtain a pelvic ultrasound.  This is pending at this time.  Patient signed out to Dr. Anitra Lauth for ultimate  disposition.  I suspect patient's ultrasound will be stable from previous and she may be discharged home on antibiotics for her urinary tract infection.  OB/GYN and the patient are agreeable to this.  If there are any signs of torsion, ruptured cyst or other concerns Dr. Sallye Ober is available by telephone and will intervene this evening.   Final Clinical Impression(s) / ED Diagnoses Final diagnoses:  Acute cystitis without hematuria    Rx / DC Orders ED Discharge Orders          Ordered    ciprofloxacin (CIPRO) 500 MG tablet  Every 12 hours        05/24/22 1839              Mariacristina Aday, Gabriel Cirri, PA-C 05/24/22 1845    Gwyneth Sprout, MD 05/24/22 2120

## 2022-05-24 NOTE — ED Provider Triage Note (Signed)
Emergency Medicine Provider Triage Evaluation Note  [name] , a 36 y.o. female  was evaluated in triage.  Pt complains of abdominal pain x 3 days. Prior hx of ovarian cyst, takes 4mg  dilaudid for pain control with no improvement. Here with exacerbated symptoms, nausea and subjective fever.  Review of Systems  Positive: Abdominal pain, nausea, subjective fever Negative: Vomiting, vagina discharge  Physical Exam  BP (!) 141/98   Pulse 92   Temp 98.5 F (36.9 C) (Oral)   Resp 16   LMP 03/25/2022 (Approximate)   SpO2 98%  Gen:   Awake, no distress   Resp:  Normal effort  MSK:   Moves extremities without difficulty  Other:  Abdomen is soft, midly tender to palpation.  Medical Decision Making  Medically screening exam initiated at 3:01 PM.  Appropriate orders placed.  05/26/2022 was informed that the remainder of the evaluation will be completed by another provider, this initial triage assessment does not replace that evaluation, and the importance of remaining in the ED until their evaluation is complete.     Lenox Ponds, PA-C 05/24/22 1504

## 2022-05-24 NOTE — ED Triage Notes (Signed)
Pt dx with cyst on L ovary and has been evaluated by OB/GYN for same. Pain x 3.5 months, worse in the last week. Having nausea without vomiting. Advised if pain not better to come to the hospital for emergency surgery instead of having it scheduled.

## 2022-05-24 NOTE — Discharge Instructions (Addendum)
You were noted to have a urinary tract infection today.  You have been given the first dose of your antibiotics.  Pick up the remainder at your pharmacy and start tomorrow morning.  Know that they may upset your stomach so take them with food. Also Dr. Sallye Ober had sent in more pain medication for you.  There is no sign of twisting of your ovary today or lack of blood flow.  Your cyst is now 4.6 cm is most likely the source of your pain as well as possibly the urinary tract infection.  Continue to take it easy, use the pain medication as needed.

## 2022-05-24 NOTE — ED Notes (Signed)
Patient transported to Ultrasound 

## 2022-05-28 ENCOUNTER — Other Ambulatory Visit: Payer: Self-pay | Admitting: Obstetrics & Gynecology

## 2022-05-28 ENCOUNTER — Encounter (HOSPITAL_COMMUNITY): Payer: Self-pay | Admitting: Obstetrics & Gynecology

## 2022-05-28 ENCOUNTER — Other Ambulatory Visit: Payer: Self-pay

## 2022-05-28 DIAGNOSIS — N83202 Unspecified ovarian cyst, left side: Secondary | ICD-10-CM

## 2022-05-28 NOTE — Progress Notes (Addendum)
Ms Chauca denies chest pain or shortness of breath. Patient denies having any s/s of Covid in her household, also denies any known exposure to Covid.   Ms Hamblin 's PCP is Coral Ceo with [facility].  Ms Bultman has been seen in the ED, a [facility] hospital with chest pain. Patient was seen 03/30/22 by Dr.George Pernell Dupre to follow up on chest pain that patient said had not been an issue since February, patient said that Dr. Freida Busman wanted to do an ECHO and have patient return in 3 months, depending on what ECHO showed. Ms Lenoir has not had the ECHO done. Patient said she had so much other going gastric issues, cluster headaches and left sided abdominal pain.  I questioned patient to see when chest pain started, she stated that it was in February and she has not had any since February 2023.  Ms Melberg stated that when she saw Dr. Freida Busman in May, the Dr wanted the ECHO to see if it was possibly Afib. I asked patient if she had palpations, she said yes, she could feel them and they where fast at times. I asked patient when was the last time she had   palpations, patient replied February 2023. Ms Pequignot stated that some of this could be from Covid, patient reports that she has had it 4-5 times. I requested EKG from [facility] I instructed patient to not take any more Advil.   Ms Marrone lives out of town she is unable to pick up CHG. I spoke with Dr. Maple Hudson regarding Ms Armbrister failure to have ECHO after May appointment with cardiology.  Patient states that she no longer has palpations or chest pain.

## 2022-05-28 NOTE — H&P (Signed)
[name] is an 36 y.o. female with pelvic pain and found to have a 4.6 x 4.6 x 4.7 cm complex hypoechoic cyst with internal reticular echogenicity, most consistent with a hemorrhagic cyst.  She has been managing her pelvic pain with tylenol, ibuprofen and dilaudid and without adequate pain control, therefore she desires laparoscopic removal of the lesion.   Pertinent Gynecological History: Menses: flow is moderate Bleeding: none currently Contraception: OCP (estrogen/progesterone) DES exposure: unknown Blood transfusions: none Sexually transmitted diseases: past history:  Previous GYN Procedures: Diagnostic laparoscopy.  Last mammogram:  N/A   Last pap: normal Date: 12/07/21 OB History: G1, P1  Menstrual History: Patient's last menstrual period was 04/26/2022 (approximate).    Past Medical History:  Diagnosis Date   ADHD (attention deficit hyperactivity disorder)    Allergy    seasonal   Anxiety    Arthritis    Aspergillosis (HCC)    sinus   Chest pain    none since February.   Chronic idiopathic urticaria    Complication of anesthesia 2015   woke up early, when tube was removal, "I had blood all over my teeth and face, I could not catch my breath"  This was at [facility], Nov 2018 had surgery at [facility].   Dermatographism    Duodenitis    GERD (gastroesophageal reflux disease)    Headache    Herpes    History of kidney stones    PCOS (polycystic ovarian syndrome)    PID (pelvic inflammatory disease)    Pneumonia    PONV (postoperative nausea and vomiting)    vomiting after tonsillectomy, 2008    Past Surgical History:  Procedure Laterality Date   APPENDECTOMY     CERVICAL CERCLAGE     ESOPHAGOGASTRODUODENOSCOPY     KNEE SURGERY Right 2007   LAPAROSCOPIC APPENDECTOMY N/A 08/16/2017   Procedure: APPENDECTOMY LAPAROSCOPIC;  Surgeon: Glenna Fellows, MD;  Location: [facility];  Service: General;  Laterality: N/A;   LAPAROSCOPIC LYSIS OF ADHESIONS N/A 10/31/2016    Procedure: LAPAROSCOPIC LYSIS OF ADHESIONS;  Surgeon: Hoover Browns, MD;  Location: [facility];  Service: Gynecology;  Laterality: N/A;   LAPAROSCOPY N/A 10/31/2016   Procedure: LAPAROSCOPY DIAGNOSTIC;  Surgeon: Hoover Browns, MD;  Location: [facility];  Service: Gynecology;  Laterality: N/A;   NASAL SINUS SURGERY     TONSILLECTOMY  2008   WISDOM TOOTH EXTRACTION      Family History  Problem Relation Age of Onset   Allergies Sister    Allergies Mother    Asthma Mother    Allergies Father     Social History:  reports that she quit smoking about 6 years ago. Her smoking use included cigarettes. She has a 1.00 pack-year smoking history. She has never used smokeless tobacco. She reports that she does not drink alcohol and does not use drugs.  Allergies:  Allergies  Allergen Reactions   Penicillins Anaphylaxis    ++++ABLE TO TAKE ROCEPHIN++++ Has patient had a PCN reaction causing immediate rash, facial/tongue/throat swelling, SOB or lightheadedness with hypotension: yes Has patient had a PCN reaction causing severe rash involving mucus membranes or skin necrosis: unknown Has patient had a PCN reaction that required hospitalization no Has patient had a PCN reaction occurring within the last 10 years: yes If all of the above answers are "NO", then may proceed with Cephalosporin use.    Shrimp [Shellfish Allergy] Anaphylaxis   Plecanatide Other (See Comments)    Trulance Pitting edema    Amoxicillin-Pot  Clavulanate    Clarithromycin Other (See Comments)    Gi -upset   Eggs Or Egg-Derived Products Nausea Only    Yolk   Moxifloxacin Other (See Comments)    Gi- upset   Soy Allergy Nausea Only   Wheat Bran Other (See Comments)    inflammation    Current Outpatient Medications  Medication Instructions   albuterol (VENTOLIN HFA) 108 (90 Base) MCG/ACT inhaler 2 puffs, Inhalation, Every 4 hours PRN   ALPRAZolam (XANAX) 1 mg, Oral, 2 times daily PRN, for anxiety   azelastine (ASTELIN) 0.1 % nasal  spray 1 spray, Each Nare, Daily, Use in each nostril as directed   azithromycin (ZITHROMAX) 250 mg, Oral, Daily   ciprofloxacin (CIPRO) 500 mg, Oral, Every 12 hours   desvenlafaxine (PRISTIQ) 50 mg, Oral, Daily   diclofenac Sodium (VOLTAREN) 1 % GEL 1 Application, Topical, Daily PRN   DULoxetine (CYMBALTA) 30 mg, Oral, Daily   DULoxetine (CYMBALTA) 60 mg, Oral, Daily, Start after 2 weeks of the 30 mg.   ESTARYLLA 0.25-35 MG-MCG tablet 1 tablet, Oral, Daily   gabapentin (NEURONTIN) 600 mg, Oral, Daily at bedtime   HYDROmorphone (DILAUDID) 4 mg, Oral, Every 4 hours PRN   hydrOXYzine (ATARAX) 10 MG tablet TAKE 1 TABLET(10 MG) BY MOUTH EVERY 6 HOURS AS NEEDED   ibuprofen (ADVIL) 800 mg, Oral, Every 8 hours PRN   loratadine (CLARITIN) 10 mg, Oral, Daily   senna (SENOKOT) 8.6 MG tablet 2 tablets, Oral, Daily   SUMAtriptan (IMITREX) 50 mg, Oral, As needed   topiramate (TOPAMAX) 100 mg, Oral, Daily at bedtime    Review of Systems Constitutional: Denies fevers/chills Cardiovascular: Denies chest pain or palpitations Pulmonary: Denies coughing or wheezing Gastrointestinal: Denies nausea, vomiting or diarrhea Genitourinary: With pelvic pain, denies unusual vaginal bleeding, unusual vaginal discharge, dysuria, urgency or frequency.  Musculoskeletal: Denies muscle or joint aches and pain.  Neurology: Denies abnormal sensations such as tingling or numbness.    Last menstrual period 04/26/2022. Physical Exam Blood pressure 118/70, pulse 88, temperature (!) 97.3 F (36.3 C), temperature source Oral, resp. rate 18, height 5\' 4"  (1.626 m), weight 68.5 kg, last menstrual period 04/26/2022, SpO2 97 %. BMI 25.92 kg/m squared. Constitutional: She is oriented to person, place, and time. She appears well-developed and well-nourished.  HENT:  Head: Normocephalic and atraumatic.  Eyes: Conjunctivae and EOM are normal.  Neck: Normal range of motion. Neck supple.  Cardiovascular: Normal rate, regular rhythm,  normal heart sounds and intact distal pulses.  Respiratory: Effort normal and breath sounds normal.  GI: Soft. She exhibits no mass. There is no tenderness.  Genitourinary: With bilateral adnexal tenderness, left side more than right side.    Musculoskeletal: Normal range of motion.  Neurological: She is alert and oriented to person, place, and time.  Skin: Skin is warm and dry.  Psychiatric: She has a normal mood and affect. Judgment normal.    Recent Results (from the past 2160 hour(s))  Lipase, blood     Status: None   Collection Time: 03/10/22 12:58 AM  Result Value Ref Range   Lipase 29 11 - 51 U/L    Comment: Performed at [facility], [address] [place], [place]  Comprehensive metabolic panel     Status: Abnormal   Collection Time: 03/10/22 12:58 AM  Result Value Ref Range   Sodium 137 135 - 145 mmol/L   Potassium 3.9 3.5 - 5.1 mmol/L   Chloride 107 98 - 111 mmol/L   CO2  23 22 - 32 mmol/L   Glucose, Bld 88 70 - 99 mg/dL    Comment: Glucose reference range applies only to samples taken after fasting for at least 8 hours.   BUN 12 6 - 20 mg/dL   Creatinine, Ser 8.52 0.44 - 1.00 mg/dL   Calcium 8.3 (L) 8.9 - 10.3 mg/dL   Total Protein 6.5 6.5 - 8.1 g/dL   Albumin 3.9 3.5 - 5.0 g/dL   AST 25 15 - 41 U/L   ALT 40 0 - 44 U/L   Alkaline Phosphatase 52 38 - 126 U/L   Total Bilirubin 0.3 0.3 - 1.2 mg/dL   GFR, Estimated >77 >82 mL/min    Comment: (NOTE) Calculated using the CKD-EPI Creatinine Equation (2021)    Anion gap 7 5 - 15    Comment: Performed at [facility], [address], [place], [place] [address]  CBC     Status: Abnormal   Collection Time: 03/10/22 12:58 AM  Result Value Ref Range   WBC 12.0 (H) 4.0 - 10.5 K/uL   RBC 4.46 3.87 - 5.11 MIL/uL   Hemoglobin 14.2 12.0 - 15.0 g/dL   HCT 61.4 43.1 - 54.0 %   MCV 93.0 80.0 - 100.0 fL   MCH 31.8 26.0 - 34.0 pg   MCHC 34.2 30.0 - 36.0 g/dL   RDW 08.6 76.1 - 95.0 %   Platelets 237 150 - 400 K/uL    nRBC 0.0 0.0 - 0.2 %    Comment: Performed at [facility], [address] [place], [place] [address]  Urinalysis, Routine w reflex microscopic Urine, Clean Catch     Status: Abnormal   Collection Time: 03/10/22  1:40 AM  Result Value Ref Range   Color, Urine YELLOW YELLOW   APPearance TURBID (A) CLEAR   Specific Gravity, Urine 1.036 (H) 1.005 - 1.030   pH 5.0 5.0 - 8.0   Glucose, UA NEGATIVE NEGATIVE mg/dL   Hgb urine dipstick NEGATIVE NEGATIVE   Bilirubin Urine NEGATIVE NEGATIVE   Ketones, ur NEGATIVE NEGATIVE mg/dL   Protein, ur 30 (A) NEGATIVE mg/dL   Nitrite NEGATIVE NEGATIVE   Leukocytes,Ua MODERATE (A) NEGATIVE   WBC, UA 21-50 0 - 5 WBC/hpf   Bacteria, UA FEW (A) NONE SEEN   WBC Clumps PRESENT     Comment: Performed at [facility], [address] [place], [place] [address]  I-Stat beta hCG blood, ED ([facility], [facility], [facility] only)     Status: None   Collection Time: 03/10/22  1:44 AM  Result Value Ref Range   I-stat hCG, quantitative <5.0 <5 mIU/mL   Comment 3            Comment:   GEST. AGE      CONC.  (mIU/mL)   <=1 WEEK        5 - 50     2 WEEKS       50 - 500     3 WEEKS       100 - 10,000     4 WEEKS     1,000 - 30,000        FEMALE AND NON-PREGNANT FEMALE:     LESS THAN 5 mIU/mL   GC/Chlamydia probe amp (Door) not at [facility]     Status: None   Collection Time: 03/10/22  4:46 AM  Result Value Ref Range   Neisseria Gonorrhea Negative    Chlamydia Negative    Comment Normal Reference [place] Chlamydia -  Negative    Comment      Normal Reference Range Neisseria Gonorrhea - Negative  Lipase, blood     Status: None   Collection Time: 03/21/22 10:51 PM  Result Value Ref Range   Lipase 30 11 - 51 U/L    Comment: Performed at [facility], [address] [place], [place] [address]  Comprehensive metabolic panel     Status: Abnormal   Collection Time: 03/21/22 10:51 PM  Result Value Ref Range   Sodium 139 135 - 145 mmol/L   Potassium 4.3 3.5 - 5.1 mmol/L   Chloride 106 98 -  111 mmol/L   CO2 26 22 - 32 mmol/L   Glucose, Bld 105 (H) 70 - 99 mg/dL    Comment: Glucose reference range applies only to samples taken after fasting for at least 8 hours.   BUN 13 6 - 20 mg/dL   Creatinine, Ser 9.52 0.44 - 1.00 mg/dL   Calcium 9.0 8.9 - 84.1 mg/dL   Total Protein 7.5 6.5 - 8.1 g/dL   Albumin 4.3 3.5 - 5.0 g/dL   AST 24 15 - 41 U/L   ALT 32 0 - 44 U/L   Alkaline Phosphatase 66 38 - 126 U/L   Total Bilirubin 0.9 0.3 - 1.2 mg/dL   GFR, Estimated >32 >44 mL/min    Comment: (NOTE) Calculated using the CKD-EPI Creatinine Equation (2021)    Anion gap 7 5 - 15    Comment: Performed at [facility], [address], [place], [place] [address]  CBC     Status: Abnormal   Collection Time: 03/21/22 10:51 PM  Result Value Ref Range   WBC 16.5 (H) 4.0 - 10.5 K/uL   RBC 4.82 3.87 - 5.11 MIL/uL   Hemoglobin 15.5 (H) 12.0 - 15.0 g/dL   HCT 25.3 66.4 - 40.3 %   MCV 94.2 80.0 - 100.0 fL   MCH 32.2 26.0 - 34.0 pg   MCHC 34.1 30.0 - 36.0 g/dL   RDW 47.4 25.9 - 56.3 %   Platelets 283 150 - 400 K/uL   nRBC 0.0 0.0 - 0.2 %    Comment: Performed at [facility], [address] [place], [place] [address]  Urinalysis, Routine w reflex microscopic Urine, Clean Catch     Status: Abnormal   Collection Time: 03/21/22 11:07 PM  Result Value Ref Range   Color, Urine YELLOW YELLOW   APPearance HAZY (A) CLEAR   Specific Gravity, Urine 1.023 1.005 - 1.030   pH 5.0 5.0 - 8.0   Glucose, UA NEGATIVE NEGATIVE mg/dL   Hgb urine dipstick NEGATIVE NEGATIVE   Bilirubin Urine NEGATIVE NEGATIVE   Ketones, ur 5 (A) NEGATIVE mg/dL   Protein, ur 332 (A) NEGATIVE mg/dL   Nitrite NEGATIVE NEGATIVE   Leukocytes,Ua NEGATIVE NEGATIVE   RBC / HPF 0-5 0 - 5 RBC/hpf   WBC, UA 6-10 0 - 5 WBC/hpf   Bacteria, UA RARE (A) NONE SEEN   Squamous Epithelial / LPF 0-5 0 - 5   Mucus PRESENT    Hyaline Casts, UA PRESENT     Comment: Performed at [facility], [address], [place], [place] [address]  Pregnancy,  urine     Status: None   Collection Time: 03/21/22 11:07 PM  Result Value Ref Range   Preg Test, Ur NEGATIVE NEGATIVE    Comment:        THE SENSITIVITY OF THIS METHODOLOGY IS >20 mIU/mL. Performed at [facility], [phone]  [address], [place], [place] [address]   Urinalysis, Routine w reflex microscopic     Status: Abnormal   Collection Time: 05/24/22  2:42 PM  Result Value Ref Range   Color, Urine YELLOW YELLOW   APPearance HAZY (A) CLEAR   Specific Gravity, Urine 1.025 1.005 - 1.030   pH 5.0 5.0 - 8.0   Glucose, UA NEGATIVE NEGATIVE mg/dL   Hgb urine dipstick SMALL (A) NEGATIVE   Bilirubin Urine NEGATIVE NEGATIVE   Ketones, ur 5 (A) NEGATIVE mg/dL   Protein, ur 30 (A) NEGATIVE mg/dL   Nitrite POSITIVE (A) NEGATIVE   Leukocytes,Ua MODERATE (A) NEGATIVE   RBC / HPF 6-10 0 - 5 RBC/hpf   WBC, UA >50 (H) 0 - 5 WBC/hpf   Bacteria, UA FEW (A) NONE SEEN   Squamous Epithelial / LPF 0-5 0 - 5   Mucus PRESENT    Amorphous Crystal PRESENT     Comment: Performed at [facility], [address], [place], [place] [address]  CBC with Differential     Status: Abnormal   Collection Time: 05/24/22  3:10 PM  Result Value Ref Range   WBC 11.8 (H) 4.0 - 10.5 K/uL   RBC 4.41 3.87 - 5.11 MIL/uL   Hemoglobin 14.0 12.0 - 15.0 g/dL   HCT 18.8 41.6 - 60.6 %   MCV 89.3 80.0 - 100.0 fL   MCH 31.7 26.0 - 34.0 pg   MCHC 35.5 30.0 - 36.0 g/dL   RDW 30.1 60.1 - 09.3 %   Platelets 275 150 - 400 K/uL   nRBC 0.0 0.0 - 0.2 %   Neutrophils Relative % 66 %   Neutro Abs 7.7 1.7 - 7.7 K/uL   Lymphocytes Relative 26 %   Lymphs Abs 3.1 0.7 - 4.0 K/uL   Monocytes Relative 5 %   Monocytes Absolute 0.6 0.1 - 1.0 K/uL   Eosinophils Relative 2 %   Eosinophils Absolute 0.2 0.0 - 0.5 K/uL   Basophils Relative 1 %   Basophils Absolute 0.1 0.0 - 0.1 K/uL   Immature Granulocytes 0 %   Abs Immature Granulocytes 0.03 0.00 - 0.07 K/uL    Comment: Performed at [facility], [address] [place], [place]  [address]  Comprehensive metabolic panel     Status: Abnormal   Collection Time: 05/24/22  3:10 PM  Result Value Ref Range   Sodium 138 135 - 145 mmol/L   Potassium 3.6 3.5 - 5.1 mmol/L   Chloride 112 (H) 98 - 111 mmol/L   CO2 19 (L) 22 - 32 mmol/L   Glucose, Bld 102 (H) 70 - 99 mg/dL    Comment: Glucose reference range applies only to samples taken after fasting for at least 8 hours.   BUN 14 6 - 20 mg/dL   Creatinine, Ser 3.22 (H) 0.44 - 1.00 mg/dL   Calcium 8.4 (L) 8.9 - 10.3 mg/dL   Total Protein 6.3 (L) 6.5 - 8.1 g/dL   Albumin 3.6 3.5 - 5.0 g/dL   AST 16 15 - 41 U/L   ALT 15 0 - 44 U/L   Alkaline Phosphatase 59 38 - 126 U/L   Total Bilirubin 0.4 0.3 - 1.2 mg/dL   GFR, Estimated >02 >54 mL/min    Comment: (NOTE) Calculated using the CKD-EPI Creatinine Equation (2021)    Anion gap 7 5 - 15    Comment: Performed at [facility], [address] [place], [place] [address]  I-Stat  Beta hCG blood, ED ([facility], [facility], [facility] only)     Status: None   Collection Time: 05/24/22  3:32 PM  Result Value Ref Range   I-stat hCG, quantitative <5.0 <5 mIU/mL   Comment 3            Comment:   GEST. AGE      CONC.  (mIU/mL)   <=1 WEEK        5 - 50     2 WEEKS       50 - 500     3 WEEKS       100 - 10,000     4 WEEKS     1,000 - 30,000        FEMALE AND NON-PREGNANT FEMALE:     LESS THAN 5 mIU/mL      05/29/2022: ABO/RH(D) B POS   Antibody Screen NEG    05/29/22: Urine pregnancy test: Negative.   EXAM: TRANSABDOMINAL AND TRANSVAGINAL ULTRASOUND OF PELVIS  DOPPLER ULTRASOUND OF OVARIES  TECHNIQUE: Both transabdominal and transvaginal ultrasound examinations of the pelvis were performed. Transabdominal technique was performed for global imaging of the pelvis including uterus, ovaries, adnexal regions, and pelvic cul-de-sac.  It was necessary to proceed with endovaginal exam following the transabdominal exam to visualize the endometrium and ovaries. Color and duplex Doppler ultrasound was  utilized to evaluate blood flow to the ovaries.  COMPARISON: Prior CT from 03/22/2022.  FINDINGS: Uterus  Measurements: 7.5 x 4.6 x 4.1 cm = volume: 74.5 mL. Uterus is anteverted. No discrete fibroid or other myometrial abnormality. Prominent 1.4 cm nabothian cyst noted at the cervix.  Endometrium  Thickness: 8.2 mm. No focal abnormality visualized.  Right ovary  Measurements: 3.4 x 2.8 x 3.1 cm = volume: 14.9 mL. Normal appearance/no adnexal mass.  Left ovary  Measurements: 5.4 x 5.3 x 5.4 cm = volume: 77.1 mL. 4.6 x 4.6 x 4.7 cm complex hypoechoic cyst with internal reticular echogenicity, most consistent with a hemorrhagic cyst. No internal vascularity or solid component.  Pulsed Doppler evaluation of both ovaries demonstrates normal low-resistance arterial and venous waveforms.  Other findings  Trace free fluid present within the pelvis.  IMPRESSION: 1. 4.6 cm complex left ovarian cyst, most characteristic of a hemorrhagic cyst. 2. Trace free fluid within the pelvis, likely physiologic. 3. No evidence for ovarian torsion or other acute abnormality.   Electronically Signed By: Rise MuBenjamin McClintock M.D. On: 05/24/2022 19:53   Assessment/Plan: 36 y/o P1 with pelvic pain and with an ovarian cyst, here for laparoscopic removal of the lesion, ovarian cystectomy, possible oophorectomy, possible salpingectomy, possible laparotomy.    - This procedure has been fully reviewed with the patient and written informed consent has been obtained.   - We discussed with patient risks, benefits and alternatives of the procedure to include but not limited to risks of bleeding, infection damage to organs.  We discussed that the ovarian lesion appears benign but final pathology will have to be determined after removal of the lesion. We discussed possible risks of spread of malignancy incase of spillage of non- benign cyst contents, risks of removal of ovary and fallopian tube, we  discussed possible need for a laparotomy.  All her questions were answered and she expressed understanding.   - [facility].  - NPO and IV fluids.   Prescilla SoursEma W Carrol Bondar, MD.  05/28/2022, 10:18 PM

## 2022-05-29 ENCOUNTER — Ambulatory Visit (HOSPITAL_COMMUNITY)
Admission: RE | Admit: 2022-05-29 | Discharge: 2022-05-31 | Disposition: A | Discharge status: Home / Self Care | Payer: BLUE CROSS/BLUE SHIELD | Attending: Obstetrics & Gynecology | Admitting: Obstetrics & Gynecology

## 2022-05-29 ENCOUNTER — Ambulatory Visit (HOSPITAL_COMMUNITY): Payer: BLUE CROSS/BLUE SHIELD | Admitting: Anesthesiology

## 2022-05-29 ENCOUNTER — Encounter (HOSPITAL_COMMUNITY): Payer: Self-pay | Admitting: Obstetrics & Gynecology

## 2022-05-29 ENCOUNTER — Encounter (HOSPITAL_COMMUNITY)
Admission: RE | Disposition: A | Discharge status: Home / Self Care | Payer: Self-pay | Source: Home / Self Care | Attending: Obstetrics & Gynecology

## 2022-05-29 ENCOUNTER — Other Ambulatory Visit: Payer: Self-pay

## 2022-05-29 DIAGNOSIS — R102 Pelvic and perineal pain: Secondary | ICD-10-CM | POA: Insufficient documentation

## 2022-05-29 DIAGNOSIS — Z8742 Personal history of other diseases of the female genital tract: Principal | ICD-10-CM

## 2022-05-29 DIAGNOSIS — R519 Headache, unspecified: Secondary | ICD-10-CM | POA: Diagnosis not present

## 2022-05-29 DIAGNOSIS — N8302 Follicular cyst of left ovary: Secondary | ICD-10-CM | POA: Insufficient documentation

## 2022-05-29 DIAGNOSIS — F32A Depression, unspecified: Secondary | ICD-10-CM | POA: Diagnosis not present

## 2022-05-29 DIAGNOSIS — Z87891 Personal history of nicotine dependence: Secondary | ICD-10-CM | POA: Insufficient documentation

## 2022-05-29 DIAGNOSIS — N736 Female pelvic peritoneal adhesions (postinfective): Secondary | ICD-10-CM | POA: Insufficient documentation

## 2022-05-29 DIAGNOSIS — N83202 Unspecified ovarian cyst, left side: Secondary | ICD-10-CM | POA: Diagnosis present

## 2022-05-29 DIAGNOSIS — F419 Anxiety disorder, unspecified: Secondary | ICD-10-CM | POA: Insufficient documentation

## 2022-05-29 DIAGNOSIS — G8918 Other acute postprocedural pain: Secondary | ICD-10-CM | POA: Diagnosis present

## 2022-05-29 HISTORY — DX: Chest pain, unspecified: R07.9

## 2022-05-29 HISTORY — DX: Unspecified temporomandibular joint disorder, unspecified side: M26.609

## 2022-05-29 HISTORY — DX: Unspecified osteoarthritis, unspecified site: M19.90

## 2022-05-29 HISTORY — DX: Headache, unspecified: R51.9

## 2022-05-29 HISTORY — DX: Palpitations: R00.2

## 2022-05-29 HISTORY — DX: Unspecified intestinal obstruction, unspecified as to partial versus complete obstruction: K56.609

## 2022-05-29 HISTORY — PX: LAPAROSCOPIC OVARIAN CYSTECTOMY: SHX6248

## 2022-05-29 HISTORY — DX: Personal history of urinary calculi: Z87.442

## 2022-05-29 HISTORY — DX: Attention-deficit hyperactivity disorder, unspecified type: F90.9

## 2022-05-29 HISTORY — DX: Pneumonia, unspecified organism: J18.9

## 2022-05-29 HISTORY — DX: Disease of intestine, unspecified: K63.9

## 2022-05-29 HISTORY — DX: Raynaud's syndrome without gangrene: I73.00

## 2022-05-29 LAB — POCT PREGNANCY, URINE: Preg Test, Ur: NEGATIVE

## 2022-05-29 LAB — SURGICAL PCR SCREEN
MRSA, PCR: NEGATIVE
Staphylococcus aureus: NEGATIVE

## 2022-05-29 LAB — TYPE AND SCREEN
ABO/RH(D): B POS
Antibody Screen: NEGATIVE

## 2022-05-29 SURGERY — EXCISION, CYST, OVARY, LAPAROSCOPIC
Anesthesia: General | Site: Abdomen

## 2022-05-29 MED ORDER — PROMETHAZINE HCL 25 MG/ML IJ SOLN: 6.2500 mg | INTRAMUSCULAR | Status: DC | PRN

## 2022-05-29 MED ORDER — POVIDONE-IODINE 10 % EX SWAB: 2.0000 | Freq: Once | CUTANEOUS | Status: DC

## 2022-05-29 MED ORDER — ACETAMINOPHEN 500 MG PO TABS
1000.0000 mg | ORAL_TABLET | Freq: Once | ORAL | Status: AC
  Filled 2022-05-29: qty 2

## 2022-05-29 MED ORDER — IBUPROFEN 800 MG PO TABS: 800.0000 mg | ORAL_TABLET | Freq: Three times a day (TID) | ORAL | 0 refills | Status: DC | PRN

## 2022-05-29 MED ORDER — FENTANYL CITRATE (PF) 100 MCG/2ML IJ SOLN
50.0000 ug | Freq: Once | INTRAMUSCULAR | Status: AC
  Administered 2022-05-29: 100 ug via INTRAVENOUS

## 2022-05-29 MED ORDER — CHLORHEXIDINE GLUCONATE 0.12 % MT SOLN
15.0000 mL | Freq: Once | OROMUCOSAL | Status: AC
  Administered 2022-05-29: 15 mL via OROMUCOSAL
  Filled 2022-05-29: qty 15

## 2022-05-29 MED ORDER — AMISULPRIDE (ANTIEMETIC) 5 MG/2ML IV SOLN: 10.0000 mg | Freq: Once | INTRAVENOUS | Status: DC | PRN

## 2022-05-29 MED ORDER — MIDAZOLAM HCL 5 MG/5ML IJ SOLN
INTRAMUSCULAR | Status: DC | PRN
  Administered 2022-05-29: 2 mg via INTRAVENOUS

## 2022-05-29 MED ORDER — PHENYLEPHRINE HCL (PRESSORS) 10 MG/ML IV SOLN
INTRAVENOUS | Status: DC | PRN
  Administered 2022-05-29 (×7): 80 ug via INTRAVENOUS

## 2022-05-29 MED ORDER — EPHEDRINE SULFATE (PRESSORS) 50 MG/ML IJ SOLN
INTRAMUSCULAR | Status: DC | PRN
  Administered 2022-05-29 (×2): 5 mg via INTRAVENOUS

## 2022-05-29 MED ORDER — ACETAMINOPHEN 10 MG/ML IV SOLN
1000.0000 mg | Freq: Once | INTRAVENOUS | Status: AC
Start: 2022-05-29 — End: 2022-05-29
  Administered 2022-05-29: 1000 mg via INTRAVENOUS

## 2022-05-29 MED ORDER — GABAPENTIN 300 MG PO CAPS
ORAL_CAPSULE | ORAL | Status: AC
  Administered 2022-05-29: 300 mg via ORAL
  Filled 2022-05-29: qty 1

## 2022-05-29 MED ORDER — PROPOFOL 10 MG/ML IV BOLUS
INTRAVENOUS | Status: DC | PRN
  Administered 2022-05-29: 100 mg via INTRAVENOUS

## 2022-05-29 MED ORDER — HYDROMORPHONE HCL 1 MG/ML IJ SOLN
0.2500 mg | INTRAMUSCULAR | Status: DC | PRN
  Administered 2022-05-29 (×3): 0.5 mg via INTRAVENOUS
  Administered 2022-05-29: 0.25 mg via INTRAVENOUS
  Administered 2022-05-29 (×2): 0.5 mg via INTRAVENOUS
  Administered 2022-05-29: 0.25 mg via INTRAVENOUS

## 2022-05-29 MED ORDER — ROCURONIUM BROMIDE 100 MG/10ML IV SOLN
INTRAVENOUS | Status: DC | PRN
  Administered 2022-05-29: 60 mg via INTRAVENOUS
  Administered 2022-05-29: 20 mg via INTRAVENOUS

## 2022-05-29 MED ORDER — FENTANYL CITRATE (PF) 100 MCG/2ML IJ SOLN
INTRAMUSCULAR | Status: AC
  Filled 2022-05-29: qty 2

## 2022-05-29 MED ORDER — KETOROLAC TROMETHAMINE 30 MG/ML IJ SOLN
30.0000 mg | Freq: Once | INTRAMUSCULAR | Status: AC
  Administered 2022-05-29: 30 mg via INTRAVENOUS
  Filled 2022-05-29: qty 1

## 2022-05-29 MED ORDER — PROPOFOL 10 MG/ML IV BOLUS
INTRAVENOUS | Status: AC
  Filled 2022-05-29: qty 20

## 2022-05-29 MED ORDER — KETOROLAC TROMETHAMINE 30 MG/ML IJ SOLN
INTRAMUSCULAR | Status: AC
  Filled 2022-05-29: qty 1

## 2022-05-29 MED ORDER — 0.9 % SODIUM CHLORIDE (POUR BTL) OPTIME
TOPICAL | Status: DC | PRN
  Administered 2022-05-29: 1000 mL

## 2022-05-29 MED ORDER — LIDOCAINE-EPINEPHRINE 1 %-1:100000 IJ SOLN
INTRAMUSCULAR | Status: DC | PRN
  Administered 2022-05-29: 19 mL

## 2022-05-29 MED ORDER — DEXAMETHASONE SODIUM PHOSPHATE 10 MG/ML IJ SOLN
INTRAMUSCULAR | Status: DC | PRN
  Administered 2022-05-29: 10 mg via INTRAVENOUS

## 2022-05-29 MED ORDER — GABAPENTIN 300 MG PO CAPS
300.0000 mg | ORAL_CAPSULE | Freq: Once | ORAL | Status: AC
  Filled 2022-05-29: qty 1

## 2022-05-29 MED ORDER — MIDAZOLAM HCL 2 MG/2ML IJ SOLN
INTRAMUSCULAR | Status: AC
  Filled 2022-05-29: qty 2

## 2022-05-29 MED ORDER — HYDROMORPHONE HCL 1 MG/ML IJ SOLN
INTRAMUSCULAR | Status: AC
  Filled 2022-05-29: qty 1

## 2022-05-29 MED ORDER — OXYCODONE HCL 5 MG PO TABS: 5.0000 mg | ORAL_TABLET | Freq: Once | ORAL | Status: DC | PRN

## 2022-05-29 MED ORDER — ORAL CARE MOUTH RINSE: 15.0000 mL | Freq: Once | OROMUCOSAL | Status: AC

## 2022-05-29 MED ORDER — DIPHENHYDRAMINE HCL 50 MG/ML IJ SOLN
12.5000 mg | Freq: Once | INTRAMUSCULAR | Status: AC
  Administered 2022-05-29: 12.5 mg via INTRAVENOUS
  Filled 2022-05-29: qty 1

## 2022-05-29 MED ORDER — FENTANYL CITRATE (PF) 250 MCG/5ML IJ SOLN
INTRAMUSCULAR | Status: AC
  Filled 2022-05-29: qty 5

## 2022-05-29 MED ORDER — MEPERIDINE HCL 25 MG/ML IJ SOLN: 6.2500 mg | INTRAMUSCULAR | Status: DC | PRN

## 2022-05-29 MED ORDER — SUGAMMADEX SODIUM 200 MG/2ML IV SOLN
INTRAVENOUS | Status: DC | PRN
  Administered 2022-05-29: 200 mg via INTRAVENOUS

## 2022-05-29 MED ORDER — HEMOSTATIC AGENTS (NO CHARGE) OPTIME
TOPICAL | Status: DC | PRN
  Administered 2022-05-29: 1 via TOPICAL

## 2022-05-29 MED ORDER — OXYCODONE HCL 5 MG/5ML PO SOLN: 5.0000 mg | Freq: Once | ORAL | Status: DC | PRN

## 2022-05-29 MED ORDER — HYDROMORPHONE HCL 4 MG PO TABS: 4.0000 mg | ORAL_TABLET | Freq: Three times a day (TID) | ORAL | 0 refills | Status: DC | PRN

## 2022-05-29 MED ORDER — LIDOCAINE-EPINEPHRINE 1 %-1:100000 IJ SOLN
INTRAMUSCULAR | Status: AC
  Filled 2022-05-29: qty 2

## 2022-05-29 MED ORDER — LACTATED RINGERS IV SOLN: INTRAVENOUS | Status: DC

## 2022-05-29 MED ORDER — ACETAMINOPHEN 500 MG PO TABS
ORAL_TABLET | ORAL | Status: AC
  Administered 2022-05-29: 1000 mg via ORAL
  Filled 2022-05-29: qty 2

## 2022-05-29 MED ORDER — ACETAMINOPHEN 10 MG/ML IV SOLN
INTRAVENOUS | Status: AC
  Filled 2022-05-29: qty 100

## 2022-05-29 MED ORDER — HYDROMORPHONE HCL 1 MG/ML IJ SOLN: 0.5000 mg | INTRAMUSCULAR | Status: DC | PRN

## 2022-05-29 MED ORDER — LIDOCAINE HCL (CARDIAC) PF 100 MG/5ML IV SOSY
PREFILLED_SYRINGE | INTRAVENOUS | Status: DC | PRN
  Administered 2022-05-29: 60 mg via INTRAVENOUS

## 2022-05-29 MED ORDER — ONDANSETRON HCL 4 MG/2ML IJ SOLN
INTRAMUSCULAR | Status: DC | PRN
  Administered 2022-05-29: 4 mg via INTRAVENOUS

## 2022-05-29 MED ORDER — FENTANYL CITRATE (PF) 100 MCG/2ML IJ SOLN
INTRAMUSCULAR | Status: DC | PRN
  Administered 2022-05-29: 50 ug via INTRAVENOUS
  Administered 2022-05-29: 25 ug via INTRAVENOUS

## 2022-05-29 SURGICAL SUPPLY — 63 items
APPLICATOR ARISTA FLEXITIP XL (MISCELLANEOUS) ×1 IMPLANT
BAG COUNTER SPONGE SURGICOUNT (BAG) ×2 IMPLANT
BARRIER ADHS 3X4 INTERCEED (GAUZE/BANDAGES/DRESSINGS) ×1 IMPLANT
CABLE HIGH FREQUENCY MONO STRZ (ELECTRODE) IMPLANT
CANISTER SUCT 3000ML PPV (MISCELLANEOUS) ×2 IMPLANT
COVER MAYO STAND STRL (DRAPES) ×1 IMPLANT
DERMABOND ADVANCED (GAUZE/BANDAGES/DRESSINGS) ×2
DERMABOND ADVANCED .7 DNX12 (GAUZE/BANDAGES/DRESSINGS) ×1 IMPLANT
DRAPE WARM FLUID 44X44 (DRAPES) IMPLANT
DRSG OPSITE POSTOP 3X4 (GAUZE/BANDAGES/DRESSINGS) ×2 IMPLANT
DRSG OPSITE POSTOP 4X10 (GAUZE/BANDAGES/DRESSINGS) ×2 IMPLANT
DURAPREP 26ML APPLICATOR (WOUND CARE) ×2 IMPLANT
GAUZE 4X4 16PLY ~~LOC~~+RFID DBL (SPONGE) IMPLANT
GLOVE BIOGEL PI IND STRL 7.0 (GLOVE) ×4 IMPLANT
GLOVE SURG SS PI 6.5 STRL IVOR (GLOVE) ×2 IMPLANT
GLOVE SURG UNDER POLY LF SZ7 (GLOVE) ×6 IMPLANT
GOWN STRL REUS W/ TWL LRG LVL3 (GOWN DISPOSABLE) ×6 IMPLANT
GOWN STRL REUS W/TWL LRG LVL3 (GOWN DISPOSABLE) ×6
HEMOSTAT ARISTA ABSORB 3G PWDR (HEMOSTASIS) ×1 IMPLANT
IRRIG SUCT STRYKERFLOW 2 WTIP (MISCELLANEOUS)
IRRIGATION SUCT STRKRFLW 2 WTP (MISCELLANEOUS) IMPLANT
KIT TURNOVER KIT B (KITS) ×2 IMPLANT
LIGASURE LAP ATLAS 10MM 37CM (INSTRUMENTS) IMPLANT
LIGASURE VESSEL 5MM BLUNT TIP (ELECTROSURGICAL) ×1 IMPLANT
NDL INSUFFLATION 14GA 120MM (NEEDLE) IMPLANT
NEEDLE INSUFFLATION 14GA 120MM (NEEDLE) IMPLANT
NS IRRIG 1000ML POUR BTL (IV SOLUTION) ×2 IMPLANT
PACK ABDOMINAL GYN (CUSTOM PROCEDURE TRAY) ×2 IMPLANT
PACK LAPAROSCOPY BASIN (CUSTOM PROCEDURE TRAY) ×2 IMPLANT
PACK TRENDGUARD 450 HYBRID PRO (MISCELLANEOUS) IMPLANT
PAD ARMBOARD 7.5X6 YLW CONV (MISCELLANEOUS) ×2 IMPLANT
PAD OB MATERNITY 4.3X12.25 (PERSONAL CARE ITEMS) ×2 IMPLANT
POUCH SPECIMEN RETRIEVAL 10MM (ENDOMECHANICALS) IMPLANT
PROTECTOR NERVE ULNAR (MISCELLANEOUS) ×4 IMPLANT
RETRACTOR WND ALEXIS 25 LRG (MISCELLANEOUS) IMPLANT
RTRCTR WOUND ALEXIS 25CM LRG (MISCELLANEOUS)
SCISSORS LAP 5X35 DISP (ENDOMECHANICALS) ×1 IMPLANT
SET TUBE SMOKE EVAC HIGH FLOW (TUBING) ×2 IMPLANT
SLEEVE ENDOPATH XCEL 5M (ENDOMECHANICALS) ×2 IMPLANT
SLEEVE Z-THREAD 5X100MM (TROCAR) ×2 IMPLANT
SOLUTION ELECTROLUBE (MISCELLANEOUS) IMPLANT
SPIKE FLUID TRANSFER (MISCELLANEOUS) ×2 IMPLANT
SPONGE T-LAP 18X18 ~~LOC~~+RFID (SPONGE) IMPLANT
SUT CHROMIC 0 CT 1 (SUTURE) ×2 IMPLANT
SUT MNCRL AB 4-0 PS2 18 (SUTURE) ×3 IMPLANT
SUT MON AB 4-0 PS1 27 (SUTURE) ×2 IMPLANT
SUT PLAIN 2 0 XLH (SUTURE) IMPLANT
SUT VIC AB 0 CT1 18XCR BRD8 (SUTURE) IMPLANT
SUT VIC AB 0 CT1 27 (SUTURE)
SUT VIC AB 0 CT1 27XBRD ANBCTR (SUTURE) IMPLANT
SUT VIC AB 0 CT1 8-18 (SUTURE)
SUT VIC AB 3-0 CT1 27 (SUTURE)
SUT VIC AB 3-0 CT1 TAPERPNT 27 (SUTURE) IMPLANT
SUT VICRYL 0 27 CT2 27 ABS (SUTURE) IMPLANT
SUT VICRYL 0 TIES 12 18 (SUTURE) ×2 IMPLANT
SUT VICRYL 0 UR6 27IN ABS (SUTURE) ×2 IMPLANT
TOWEL GREEN STERILE FF (TOWEL DISPOSABLE) ×4 IMPLANT
TRAY FOLEY W/BAG SLVR 14FR (SET/KITS/TRAYS/PACK) ×2 IMPLANT
TRENDGUARD 450 HYBRID PRO PACK (MISCELLANEOUS)
TROCAR BALLN 12MMX100 BLUNT (TROCAR) IMPLANT
TROCAR XCEL NON-BLD 11X100MML (ENDOMECHANICALS) IMPLANT
TROCAR XCEL NON-BLD 5MMX100MML (ENDOMECHANICALS) ×2 IMPLANT
WARMER LAPAROSCOPE (MISCELLANEOUS) ×2 IMPLANT

## 2022-05-29 NOTE — Discharge Instructions (Addendum)
  [name],  You may shower as usual, keeping water exposure over the abdomen to a minimal and pat the incisions dry after showering.      You may see a sticky substance over the incisions, this is glue used for the incisions and will fall off by itself.   3.  No tampons or douching or vaginal intercourse after the procedure until at least two weeks after the surgery and after having been seen at the office.      4. Expect some light to moderate vaginal bleeding for the next 2 to 7 days but do let me know if with heavy bleeding requiring you to change a pad every two hours or less.   5. You may have some back pain because of the gas used during the procedure, this should improve with time as you ambulate regularly.   6. Please ambulate regularly at least 1 hour every day in addition to your activities of daily living.   7.  You may see bruising around and near the incisions, this  will usually improve and resolve over time.  Let me know however if it is expanding or worsening.   8.  Call me if with excessive pain not improving with medication use, excessive vaginal bleeding or fevers or chills or any other concerns.    9. Use pain medication of tylenol over the counter as well as ordered ibuprofen and dilaudid for pain as needed. Please note that Ibuprofen may increase your risk of stomach ulcers. Please ensure that you take ibuprofen after a meal and look out for symptoms of stomach ulcers such as uncontrolled pain in epigastric region. Stop the ibuprofen if with such symptoms.  Dilaudid may cause extra sedation when used at same time as antidepressants and anti anxiety medication. Try to avoid using these medication at the same time to decrease risks of sedation and other drug reactions.     10.  Do not do any heavy lifting for the next 6 weeks, I.e nothing more then 30 lbs.   11. Take your stool softeners and laxatives as needed to keep your stool soft and have regular bowel movements.  I  wish you a quick recovery,  Dr. Sallye Ober Phone: [phone] extension [phone] if regular business hours.   If After regular hours: call  office number and press the extension to speak to provider on call.

## 2022-05-29 NOTE — Anesthesia Preprocedure Evaluation (Addendum)
Anesthesia Evaluation  Patient identified by MRN, date of birth, ID band Patient awake    Reviewed: Allergy & Precautions, NPO status , Patient's Chart, lab work & pertinent test results  History of Anesthesia Complications (+) PONV and history of anesthetic complications  Airway Mallampati: I  TM Distance: >3 FB Neck ROM: Full    Dental no notable dental hx. (+) Dental Advisory Given, Teeth Intact   Pulmonary pneumonia, resolved, neg recent URI, former smoker,    Pulmonary exam normal breath sounds clear to auscultation       Cardiovascular negative cardio ROS Normal cardiovascular exam Rhythm:Regular Rate:Normal     Neuro/Psych  Headaches, PSYCHIATRIC DISORDERS Anxiety Depression    GI/Hepatic Neg liver ROS, GERD  ,  Endo/Other  Morbid obesityPolycystic ovarian  Renal/GU negative Renal ROS     Musculoskeletal  (+) Arthritis ,   Abdominal   Peds  Hematology negative hematology ROS (+)   Anesthesia Other Findings   Reproductive/Obstetrics LMP presently                            Anesthesia Physical  Anesthesia Plan  ASA: 2  Anesthesia Plan: General   Post-op Pain Management: Tylenol PO (pre-op)*, Toradol IV (intra-op)* and Gabapentin PO (pre-op)*   Induction: Intravenous  PONV Risk Score and Plan: 4 or greater and Ondansetron, Dexamethasone, Midazolam, Treatment may vary due to age or medical condition and Propofol infusion  Airway Management Planned: Oral ETT  Additional Equipment: None  Intra-op Plan:   Post-operative Plan: Extubation in OR  Informed Consent: I have reviewed the patients History and Physical, chart, labs and discussed the procedure including the risks, benefits and alternatives for the proposed anesthesia with the patient or authorized representative who has indicated his/her understanding and acceptance.     Dental advisory given  Plan Discussed  with: CRNA  Anesthesia Plan Comments:        Anesthesia Quick Evaluation

## 2022-05-29 NOTE — Anesthesia Procedure Notes (Signed)
Procedure Name: Intubation Date/Time: 05/29/2022 7:15 PM  Performed by: Yaileen Hofferber T, CRNAPre-anesthesia Checklist: Patient identified, Emergency Drugs available, Suction available and Patient being monitored Patient Re-evaluated:Patient Re-evaluated prior to induction Oxygen Delivery Method: Circle system utilized Preoxygenation: Pre-oxygenation with 100% oxygen Induction Type: IV induction Ventilation: Mask ventilation without difficulty Laryngoscope Size: Miller and 2 Grade View: Grade I Tube type: Oral Tube size: 7.5 mm Number of attempts: 1 Airway Equipment and Method: Stylet and Oral airway Placement Confirmation: ETT inserted through vocal cords under direct vision, positive ETCO2 and breath sounds checked- equal and bilateral Secured at: 21 cm Tube secured with: Tape Dental Injury: Teeth and Oropharynx as per pre-operative assessment

## 2022-05-29 NOTE — Op Note (Addendum)
[name]. [name] DOB: [birth year] MRN: [id number] Date of Procedure: 05/29/2022    PREOP DIAGNOSIS:  1. 4.6 cm hemorrhagic left ovarian cyst. 2. Pelvic pain.    POSTOP DIAGNOSIS:  Same as above.   PROCEDURES: Laparoscopic left ovarian cystectomy.  Lysis of moderate adhesions (took 20 minutes for lysis of adhesions out of total 2 hours of case).      SURGEON: Dr. Hoover Browns.   ASSISTANT: Dr. Osborn Coho.   ATTENDING ATTESTATION: I was present and scrubbed and performed the procedure and the assistant was required due to the complexity of anatomy.    ANESTHESIA: General endotracheal anesthesia.   COMPLICATIONS: None.   EBL: 100 mL.  IV Fluids:  2000 cc LR  Urine output:  230 cc clear urine    FINDINGS: Normal uterus.  Left ovary with a 4 cm ovarian cyst with sero-sangenous fluid within.  Normal left fallopian tube.  Moderate adhesions between left ovary and left fallopian tube. Moderate adhesions between left ovary and left pelvic side wall.  Normal right ovary. Normal right fallopian tube.  Normal liver edge. Normal bowel and stomach.   PROCEDURE:  Informed consent was obtained from the patient to undergo the procedure after discussing the risks benefits and alternatives of the procedure. She was taken to the operating room where anesthesia was administered without difficulty. Both arms were secured and she was placed in the dorsal lithotomy position. A pelvic exam was done with above findings.  She was prepped abdominally and vaginally in the usual sterile fashion. Foley catheter was placed in the bladder and Acorn manipulator was placed in the cervix.      Attention was then turned to the abdomen where lidocaine with epinephrine was instilled in the infraumbilical area.  The infraumbilical skin was incised with a scalpel over the prior incision and open entry through the abdominal layers to reach the peritoneum. 0-vicryl suture was placed on the fascia edges. The 12 mm [employer] Kii Balloon Blunt Tip Hasson  trocar was inserted and secured.  Abdomen was insufflated with CO2 gas and abdominal pressure was maintained at 15 mmHg.  Patient was placed in trendelenburg position.  More local anesthesia was given at incision areas and then two 5 mm ports were placed on the left side of the abdomen under visualization, one about 10 cm lateral to umbilicus and another one 3 cm medial and superior to the superior anterior iliac spine using the Applied medical bladeless trocars under direct visualization. A 5 mm port was placed on the lower right  side of abdomen opposite the lower left port.  The abdomen and pelvis were inspected with the above findings.  Lysis of moderate adhesions was done with non energized endoshears and ligasure to free the left ovary, this process took about 20 minutes out of the total approximately 2 hours used for the procedure.  The left ovary cortex was incised with the endo shears at 30 watt current and the incision extended with endo shears without energy.  The ovarian cortex was then separated from the underlying cyst wall using counter traction using the suction irrigator, endo shears, m[place], and million dollar graspers to release the cyst from its sorrounding ovarian tissue.  The ovarian cyst  inadvertently ruptured  and the cyst's hemorrhagic contents were suctioned out.  The cyst wall was further separated from the ovarian cortex with counter traction using graspers.  The cyst  wall was delivered through the 5 mm ports.  There was small spillage  of the cyst components in the pelvic cavity which was suctioned out.  The pelvis was irrigated and suctioned out.  Hemostasis on the left ovary  was enhanced with Ligasure and arista powder placed over the ovary.  Intercede was placed on the left ovary to cover the area of cystectomy.  Gas was turned off and allowed to escape.  The 48mm ports were then removed under direct visualization after the patient had received  five manual lung  inflations by anesthesia.  The umbilical port was removed with visualization.   Umbilical incision was closed using 0 Vicryl  suture.  All the skin incisions were closed with 4-0 Monocryl then closed with dermabond.  The Foley catheter and the Acorn manipulator were removed.  The patient was then awoken from anesthesia and she was taken to recovery room in stable condition   SPECIMENS:  Left ovarian cyst wall.   [name] Alexandra Ober, MD.  DATE: 05/29/2022 Time: 10:14 PM.

## 2022-05-29 NOTE — Progress Notes (Signed)
Pt c/o rash and itching to legs after using CHG wipes. Dr Renold Don notified, Benadryl given per order.

## 2022-05-29 NOTE — Transfer of Care (Signed)
Immediate Anesthesia Transfer of Care Note  Patient: [name]  Procedure(s) Performed: LAPAROSCOPIC LEFT OVARIAN CYSTECTOMY WITH LYSIS OF ADHESIONS (Abdomen)  Patient Location: PACU  Anesthesia Type:General  Level of Consciousness: drowsy  Airway & Oxygen Therapy: Patient Spontanous Breathing and Patient connected to nasal cannula oxygen  Post-op Assessment: Report given to RN, Post -op Vital signs reviewed and stable and Patient moving all extremities  Post vital signs: Reviewed and stable  Last Vitals:  Vitals Value Taken Time  BP 129/86 05/29/22 2209  Temp    Pulse 93 05/29/22 2211  Resp 14 05/29/22 2211  SpO2 100 % 05/29/22 2211  Vitals shown include unvalidated device data.  Last Pain:  Vitals:   05/29/22 1441  TempSrc:   PainSc: 8          Complications: No notable events documented.

## 2022-05-30 ENCOUNTER — Ambulatory Visit (INDEPENDENT_AMBULATORY_CARE_PROVIDER_SITE_OTHER): Payer: Self-pay | Admitting: Physician Assistant

## 2022-05-30 ENCOUNTER — Ambulatory Visit (HOSPITAL_COMMUNITY): Payer: BLUE CROSS/BLUE SHIELD

## 2022-05-30 ENCOUNTER — Encounter (HOSPITAL_COMMUNITY): Payer: Self-pay | Admitting: Obstetrics & Gynecology

## 2022-05-30 DIAGNOSIS — G8918 Other acute postprocedural pain: Secondary | ICD-10-CM | POA: Diagnosis present

## 2022-05-30 DIAGNOSIS — Z91199 Patient's noncompliance with other medical treatment and regimen due to unspecified reason: Secondary | ICD-10-CM

## 2022-05-30 DIAGNOSIS — N8302 Follicular cyst of left ovary: Secondary | ICD-10-CM | POA: Diagnosis not present

## 2022-05-30 LAB — CBC
HCT: 37 % (ref 36.0–46.0)
Hemoglobin: 12.4 g/dL (ref 12.0–15.0)
MCH: 30.8 pg (ref 26.0–34.0)
MCHC: 33.5 g/dL (ref 30.0–36.0)
MCV: 91.8 fL (ref 80.0–100.0)
Platelets: 226 10*3/uL (ref 150–400)
RBC: 4.03 MIL/uL (ref 3.87–5.11)
RDW: 11.7 % (ref 11.5–15.5)
WBC: 15.5 10*3/uL — ABNORMAL HIGH (ref 4.0–10.5)
nRBC: 0 % (ref 0.0–0.2)

## 2022-05-30 MED ORDER — SIMETHICONE 80 MG PO CHEW
80.0000 mg | CHEWABLE_TABLET | Freq: Once | ORAL | Status: AC
Start: 2022-05-30 — End: 2022-05-30
  Administered 2022-05-30: 80 mg via ORAL
  Filled 2022-05-30: qty 1

## 2022-05-30 MED ORDER — MENTHOL 3 MG MT LOZG: 1.0000 | LOZENGE | OROMUCOSAL | Status: DC | PRN

## 2022-05-30 MED ORDER — SIMETHICONE 80 MG PO CHEW
80.0000 mg | CHEWABLE_TABLET | Freq: Four times a day (QID) | ORAL | Status: DC | PRN
  Administered 2022-05-30: 80 mg via ORAL
  Filled 2022-05-30: qty 1

## 2022-05-30 MED ORDER — LORAZEPAM 2 MG/ML IJ SOLN: 1.0000 mg | Freq: Once | INTRAMUSCULAR | Status: DC | PRN

## 2022-05-30 MED ORDER — LORAZEPAM 2 MG/ML IJ SOLN
INTRAMUSCULAR | Status: AC
  Filled 2022-05-30: qty 1

## 2022-05-30 MED ORDER — HYDROMORPHONE HCL 1 MG/ML IJ SOLN
1.0000 mg | INTRAMUSCULAR | Status: DC | PRN
  Administered 2022-05-30 – 2022-05-31 (×6): 1 mg via INTRAVENOUS
  Filled 2022-05-30 (×6): qty 1

## 2022-05-30 MED ORDER — ONDANSETRON HCL 4 MG PO TABS: 4.0000 mg | ORAL_TABLET | Freq: Four times a day (QID) | ORAL | Status: DC | PRN

## 2022-05-30 MED ORDER — IBUPROFEN 600 MG PO TABS
600.0000 mg | ORAL_TABLET | Freq: Four times a day (QID) | ORAL | Status: DC
  Administered 2022-05-31 (×4): 600 mg via ORAL
  Filled 2022-05-30 (×4): qty 1

## 2022-05-30 MED ORDER — FLEET ENEMA 7-19 GM/118ML RE ENEM
1.0000 | ENEMA | Freq: Once | RECTAL | Status: AC
  Administered 2022-05-30: 1 via RECTAL
  Filled 2022-05-30 (×2): qty 1

## 2022-05-30 MED ORDER — ORAL CARE MOUTH RINSE: 15.0000 mL | OROMUCOSAL | Status: DC | PRN

## 2022-05-30 MED ORDER — ALPRAZOLAM 0.25 MG PO TABS
1.0000 mg | ORAL_TABLET | Freq: Two times a day (BID) | ORAL | Status: DC | PRN
Start: 2022-05-30 — End: 2022-05-31
  Administered 2022-05-30 – 2022-05-31 (×2): 1 mg via ORAL
  Filled 2022-05-30 (×2): qty 4

## 2022-05-30 MED ORDER — IOHEXOL 300 MG/ML  SOLN
75.0000 mL | Freq: Once | INTRAMUSCULAR | Status: AC | PRN
  Administered 2022-05-30: 75 mL via INTRAVENOUS

## 2022-05-30 MED ORDER — ACETAMINOPHEN 500 MG PO TABS
1000.0000 mg | ORAL_TABLET | Freq: Four times a day (QID) | ORAL | Status: DC
  Administered 2022-05-30 – 2022-05-31 (×6): 1000 mg via ORAL
  Filled 2022-05-30 (×7): qty 2

## 2022-05-30 MED ORDER — LORATADINE 10 MG PO TABS
10.0000 mg | ORAL_TABLET | Freq: Every day | ORAL | Status: DC
  Administered 2022-05-30 – 2022-05-31 (×2): 10 mg via ORAL
  Filled 2022-05-30 (×2): qty 1

## 2022-05-30 MED ORDER — CIPROFLOXACIN HCL 500 MG PO TABS
500.0000 mg | ORAL_TABLET | Freq: Two times a day (BID) | ORAL | Status: DC
  Administered 2022-05-30 – 2022-05-31 (×2): 500 mg via ORAL
  Filled 2022-05-30 (×2): qty 1

## 2022-05-30 MED ORDER — LORAZEPAM 1 MG PO TABS: 1.0000 mg | ORAL_TABLET | Freq: Once | ORAL | Status: DC

## 2022-05-30 MED ORDER — KETOROLAC TROMETHAMINE 30 MG/ML IJ SOLN
30.0000 mg | Freq: Four times a day (QID) | INTRAMUSCULAR | Status: AC
  Administered 2022-05-30 (×4): 30 mg via INTRAVENOUS
  Filled 2022-05-30 (×4): qty 1

## 2022-05-30 MED ORDER — LORAZEPAM 2 MG/ML IJ SOLN
1.0000 mg | Freq: Once | INTRAMUSCULAR | Status: AC
  Administered 2022-05-30: 1 mg via INTRAVENOUS

## 2022-05-30 MED ORDER — MAGNESIUM HYDROXIDE 400 MG/5ML PO SUSP: 30.0000 mL | Freq: Every day | ORAL | Status: DC | PRN

## 2022-05-30 MED ORDER — TOPIRAMATE 100 MG PO TABS
100.0000 mg | ORAL_TABLET | Freq: Every day | ORAL | Status: DC
  Administered 2022-05-30: 100 mg via ORAL
  Filled 2022-05-30 (×3): qty 1

## 2022-05-30 MED ORDER — HYDROXYZINE HCL 10 MG PO TABS
10.0000 mg | ORAL_TABLET | Freq: Every day | ORAL | Status: DC | PRN
  Administered 2022-05-31: 10 mg via ORAL
  Filled 2022-05-30: qty 1

## 2022-05-30 MED ORDER — BISACODYL 10 MG RE SUPP: 10.0000 mg | Freq: Every day | RECTAL | Status: DC | PRN

## 2022-05-30 MED ORDER — ONDANSETRON HCL 4 MG/2ML IJ SOLN
4.0000 mg | Freq: Four times a day (QID) | INTRAMUSCULAR | Status: DC | PRN
  Administered 2022-05-30 – 2022-05-31 (×2): 4 mg via INTRAVENOUS
  Filled 2022-05-30 (×2): qty 2

## 2022-05-30 MED ORDER — PANTOPRAZOLE SODIUM 40 MG PO TBEC
40.0000 mg | DELAYED_RELEASE_TABLET | Freq: Every day | ORAL | Status: DC
  Administered 2022-05-30 – 2022-05-31 (×2): 40 mg via ORAL
  Filled 2022-05-30 (×2): qty 1

## 2022-05-30 MED ORDER — HYDROMORPHONE HCL 1 MG/ML IJ SOLN
0.5000 mg | INTRAMUSCULAR | Status: DC | PRN
  Administered 2022-05-30 (×2): 0.5 mg via INTRAVENOUS
  Filled 2022-05-30 (×2): qty 0.5

## 2022-05-30 MED ORDER — HYDROMORPHONE HCL 2 MG PO TABS
4.0000 mg | ORAL_TABLET | Freq: Four times a day (QID) | ORAL | Status: DC | PRN
  Administered 2022-05-30 (×2): 4 mg via ORAL
  Filled 2022-05-30 (×2): qty 2

## 2022-05-30 MED ORDER — MAGNESIUM CITRATE PO SOLN: 1.0000 | Freq: Once | ORAL | Status: DC | PRN

## 2022-05-30 NOTE — Progress Notes (Addendum)
1 Day Post-Op Procedure(s) (LRB): LAPAROSCOPIC LEFT OVARIAN CYSTECTOMY WITH LYSIS OF ADHESIONS (N/A)  Subjective: Patient reports abdominal pain, feels like burning pain around incisions and also bilateral lower abdomen. Pain comes in waves, feels like a "squeezing" pain. Her last bowel movement was 3 days ago and with a history of chronic constipation and irritable bowel syndrome. She denies nausea or vomiting. She has ambulated to bathroom and she feels better when she stands up. She denies fevers or chills.She denies vaginal bleeding.   Objective:    05/30/2022    5:28 PM 05/30/2022   11:48 AM 05/30/2022    7:47 AM  Vitals with BMI  Systolic 99 156 88  Diastolic 64 139 44  Pulse 82 92 83   General: In mild distress complaining of lower abdominal pain.  CVS: S1, S2. Regular rate and rhythm Pulmonary: Clear to auscultation bilaterally Abdomen: soft, appropriately tender to palpation bilaterally on lower abdomen, no guarding, no rebound, with good bowel sounds on all 4 quadrants. Extremities: Warm and well perfused, no edema, no calf tenderness.  Assessment: s/p Procedure(s): LAPAROSCOPIC LEFT OVARIAN CYSTECTOMY WITH LYSIS OF ADHESIONS (N/A):  36 y/o POD # 1, with poorly controlled post-op pain, I suspect some of the pain is due to gas pain, patient also with a history of chronic narcotic use,   Plan: - Simethicone for gas pain and constipation medication.  - Pain medication as ordered. - Out of bed and ambulation.  - Will continue with admission for better pain management.   LOS: 1 day   Prescilla Sours, MD 05/30/2022, 9:00 AM.

## 2022-05-30 NOTE — Progress Notes (Signed)
Patient down for CT scan. Patient refusing CT scan due to pain, reports she is unable to lay flat for the CT scan. MD Sallye Ober called and paged. Awaiting response. No PRN pain orders available to be given at this time.

## 2022-05-30 NOTE — Progress Notes (Signed)
Patient ID: [name], female   DOB: 1986-09-20, 36 y.o.   MRN: [id number]  Abdomen and Pelvic CT scan results reviewed with radiologist: Significant for large bowels with a large amount of stool and gas in sigmoid colon.  No other significant findings. Will give patient enema. Results discussed with patient in her room who is now calm and sleepy. Dr. Hoover Browns. 05/30/2022. 6:36 PM.

## 2022-05-30 NOTE — Progress Notes (Signed)
Dr. Sallye Ober consulted about some recommendation for acute pain management. We will increase her IV PRN dose to 1mg  q3h prn and dc the PO one. Give ativan 1mg  IV prior to CT for anxiety.  , PharmD, BCIDP, AAHIVP, CPP Infectious Disease Pharmacist 05/30/2022 1:43 PM

## 2022-05-30 NOTE — Anesthesia Postprocedure Evaluation (Signed)
Anesthesia Post Note  Patient: [name]  Procedure(s) Performed: LAPAROSCOPIC LEFT OVARIAN CYSTECTOMY WITH LYSIS OF ADHESIONS (Abdomen)     Patient location during evaluation: PACU Anesthesia Type: General Level of consciousness: awake and alert Pain management: pain level controlled Vital Signs Assessment: post-procedure vital signs reviewed and stable Respiratory status: spontaneous breathing, nonlabored ventilation and respiratory function stable Cardiovascular status: blood pressure returned to baseline and stable Postop Assessment: no apparent nausea or vomiting Anesthetic complications: no   No notable events documented.  Last Vitals:  Vitals:   05/29/22 2345 05/30/22 0011  BP: 98/63 104/65  Pulse: 65 79  Resp: 10 17  Temp: 36.8 C 36.4 C  SpO2: 96% 100%    Last Pain:  Vitals:   05/30/22 0020  TempSrc:   PainSc: Asleep                 Yaritsa Savarino,W. EDMOND

## 2022-05-30 NOTE — Progress Notes (Addendum)
1 Day Post-Op Procedure(s) (LRB): LAPAROSCOPIC LEFT OVARIAN CYSTECTOMY WITH LYSIS OF ADHESIONS (N/A)  Subjective Patient reports continued abdominal and pelvic pain, unrelieved with pain medication and simethicone.   Feels like a "squeezing" pain.   Objective:    05/30/2022    5:28 PM 05/30/2022   11:48 AM 05/30/2022    7:47 AM  Vitals with BMI  Systolic 99 156 88  Diastolic 64 139 44  Pulse 82 92 83    General: In moderate distress complaining of pain Abdomen: Soft, tender to palpation diffusely, no rebound, with guarding. With good bowel sounds on all four quadrants.  Incision: Clean, dry and intact. Extremities: Warm and well perfused, no edema, no calf tenderness  Assessment: s/p Procedure(s): LAPAROSCOPIC LEFT OVARIAN CYSTECTOMY WITH LYSIS OF ADHESIONS (N/A):  36 y/o POD # 1, with poorly controlled post-op pain, I suspect pain is due to gas pain, patient also with a history of chronic constipation and recurrent narcotic use,   Plan: - CT scan of abdomen and pelvis for further evaluation.  Prescilla Sours, MD 05/30/2022, 1:26 PM

## 2022-05-30 NOTE — Progress Notes (Signed)
   Complete physical exam  Patient: [name]   DOB: 07/13/1997   36 y.o. Female  MRN: [id number]  Subjective:    No chief complaint on file.   [name] is a 36 y.o. female who presents today for a complete physical exam. She reports consuming a {diet types:17450} diet. {types:19826} She generally feels {DESC; WELL/FAIRLY WELL/POORLY:18703}. She reports sleeping {DESC; WELL/FAIRLY WELL/POORLY:18703}. She {does/does not:200015} have additional problems to discuss today.    Most recent fall risk assessment:    03/20/2022   10:42 AM  Fall Risk   Falls in the past year? 0  Number falls in past yr: 0  Injury with Fall? 0  Risk for fall due to : No Fall Risks  Follow up Falls evaluation completed     Most recent depression screenings:    03/20/2022   10:42 AM 02/08/2021   10:46 AM  PHQ 2/9 Scores  PHQ - 2 Score 0 0  PHQ- 9 Score 5     {VISON DENTAL STD PSA (Optional):27386}  {History (Optional):23778}  Patient Care Team: Jessup, Joy, NP as PCP - General (Nurse Practitioner)   Outpatient Medications Prior to Visit  Medication Sig   fluticasone (FLONASE) 50 MCG/ACT nasal spray Place 2 sprays into both nostrils in the morning and at bedtime. After 7 days, reduce to once daily.   norgestimate-ethinyl estradiol (SPRINTEC 28) 0.25-35 MG-MCG tablet Take 1 tablet by mouth daily.   Nystatin POWD Apply liberally to affected area 2 times per day   spironolactone (ALDACTONE) 100 MG tablet Take 1 tablet (100 mg total) by mouth daily.   No facility-administered medications prior to visit.    ROS        Objective:     There were no vitals taken for this visit. {Vitals History (Optional):23777}  Physical Exam   No results found for any visits on 04/25/22. {Show previous labs (optional):23779}    Assessment & Plan:    Routine Health Maintenance and Physical Exam  Immunization History  Administered Date(s) Administered   DTaP 09/26/1999, 11/22/1999,  01/31/2000, 10/16/2000, 05/01/2004   Hepatitis A 02/26/2008, 03/03/2009   Hepatitis B 07/14/1999, 08/21/1999, 01/31/2000   HiB (PRP-OMP) 09/26/1999, 11/22/1999, 01/31/2000, 10/16/2000   IPV 09/26/1999, 11/22/1999, 07/21/2000, 05/01/2004   Influenza,inj,Quad PF,6+ Mos 06/03/2014   Influenza-Unspecified 09/02/2012   MMR 07/21/2001, 05/01/2004   Meningococcal Polysaccharide 03/02/2012   Pneumococcal Conjugate-13 10/16/2000   Pneumococcal-Unspecified 01/31/2000, 04/15/2000   Tdap 03/02/2012   Varicella 07/21/2000, 02/26/2008    Health Maintenance  Topic Date Due   HIV Screening  Never done   Hepatitis C Screening  Never done   INFLUENZA VACCINE  04/23/2022   PAP-Cervical Cytology Screening  04/25/2022 (Originally 07/12/2020)   PAP SMEAR-Modifier  04/25/2022 (Originally 07/12/2020)   TETANUS/TDAP  04/25/2022 (Originally 03/02/2022)   HPV VACCINES  Discontinued   COVID-19 Vaccine  Discontinued    Discussed health benefits of physical activity, and encouraged her to engage in regular exercise appropriate for her age and condition.  Problem List Items Addressed This Visit   None Visit Diagnoses     Annual physical exam    -  Primary   Cervical cancer screening       Need for Tdap vaccination          No follow-ups on file.     Joy Jessup, NP   

## 2022-05-30 NOTE — Progress Notes (Signed)
I was called into the room by patient around 1315. Patient is screaming in pain 10/10 to her right side, reports feeling a pop in her left side. Paged MD, for another PRN order for pain medication. Orders received and provider en route to see patient at the bedside.

## 2022-05-31 DIAGNOSIS — Z515 Encounter for palliative care: Secondary | ICD-10-CM

## 2022-05-31 DIAGNOSIS — N8302 Follicular cyst of left ovary: Secondary | ICD-10-CM | POA: Diagnosis not present

## 2022-05-31 LAB — SURGICAL PATHOLOGY

## 2022-05-31 MED ORDER — GABAPENTIN 600 MG PO TABS
600.0000 mg | ORAL_TABLET | Freq: Once | ORAL | Status: AC
Start: 2022-05-31 — End: 2022-05-31
  Administered 2022-05-31: 600 mg via ORAL
  Filled 2022-05-31: qty 1

## 2022-05-31 MED ORDER — CYCLOBENZAPRINE HCL 10 MG PO TABS
10.0000 mg | ORAL_TABLET | Freq: Three times a day (TID) | ORAL | Status: DC
  Administered 2022-05-31: 10 mg via ORAL
  Filled 2022-05-31: qty 1

## 2022-05-31 MED ORDER — HYDROMORPHONE HCL 2 MG PO TABS
4.0000 mg | ORAL_TABLET | Freq: Four times a day (QID) | ORAL | Status: DC | PRN
  Administered 2022-05-31 (×2): 4 mg via ORAL
  Filled 2022-05-31 (×2): qty 2

## 2022-05-31 MED ORDER — SIMETHICONE 80 MG PO CHEW
80.0000 mg | CHEWABLE_TABLET | Freq: Three times a day (TID) | ORAL | Status: DC
  Administered 2022-05-31 (×3): 80 mg via ORAL
  Filled 2022-05-31 (×3): qty 1

## 2022-05-31 MED ORDER — FLEET ENEMA 7-19 GM/118ML RE ENEM
1.0000 | ENEMA | Freq: Once | RECTAL | Status: AC
  Administered 2022-05-31: 1 via RECTAL
  Filled 2022-05-31: qty 1

## 2022-05-31 MED ORDER — CYCLOBENZAPRINE HCL 10 MG PO TABS: 10.0000 mg | ORAL_TABLET | Freq: Three times a day (TID) | ORAL | Status: DC

## 2022-05-31 NOTE — Progress Notes (Addendum)
2 Days Post-Op Procedure(s) (LRB): LAPAROSCOPIC LEFT OVARIAN CYSTECTOMY WITH LYSIS OF ADHESIONS (N/A)  Subjective: Patient reports continued abdominal and pelvic pain especially when lying flat and when she moves.  Pain temporarily improves when she uses pain medication. She has had several bowel movements after fleet enemas. She denies nausea or vomiting. She is ambulating and voiding without difficulty.  She denies fevers or chills.   Objective:  General: alert, cooperative, and no distress Resp: clear to auscultation bilaterally Cardio: regular rate and rhythm, S1, S2 normal, no murmur, click, rub or gallop GI: abnormal findings:  guarding, incision: clean, dry, and intact, and tender to palpation on diffuse bilateral lower abdomen Extremities: extremities normal, atraumatic, no cyanosis or edema Vaginal Bleeding: none Blood pressure (!) 98/53, pulse 78, temperature 98.5 F (36.9 C), temperature source Oral, resp. rate 18, height 5\' 4"  (1.626 m), weight 73.8 kg, last menstrual period 04/26/2022, SpO2 99 %.     05/31/2022    8:59 AM 05/31/2022    4:52 AM 05/30/2022    8:43 PM  Vitals with BMI  Systolic 98 94 101  Diastolic 53 62 66  Pulse 78 78 84    Assessment: s/p Procedure(s): LAPAROSCOPIC LEFT OVARIAN CYSTECTOMY WITH LYSIS OF ADHESIONS (N/A): stable  36 y/o POD # 2, with poorly controlled post-op pain, I suspect pain is due to gas pain and severe constipation, and possible elements of drug seeking behavior, patient with a history of chronic constipation and recurrent narcotic use,  Plan: - Continue with simethicone and fleet enema. - Will discharge IV medication and continue oral medication  - Discussed with patient normal post-op pain and pain precautions.  - Plan for discharge later today when pain is better controlled. - Consider pain management clinic referral as an outpatient.    LOS: 1 day   20, MD 05/31/2022, 9:08 AM

## 2022-05-31 NOTE — Progress Notes (Signed)
   Palliative Medicine Inpatient Follow Up Note   The Palliative Medicine Team received the consultation for [facility]. [place].  Chart reviewed with Attending Physician Dr. Linna Darner, presently it does not appear that Raechell has palliative needs as she has no serious life limiting illness.  Recommend:  - Psychiatry involvement for anxiety and depression management  -  Social workers look into getting the patient an appointment with a local pain management clinic  No Charge ______________________________________________________________________________________ Lamarr Lulas [place] Palliative Medicine Team Team Cell Phone: [phone] Please utilize secure chat with additional questions, if there is no response within 30 minutes please call the above phone number  Palliative Medicine Team providers are available by phone from 7am to 7pm daily and can be reached through the team cell phone.  Should this patient require assistance outside of these hours, please call the patient's attending physician.

## 2022-05-31 NOTE — Discharge Summary (Addendum)
Physician Discharge Summary  Patient ID: [name] MRN: [id number] DOB/AGE: Jan 08, 1986 36 y.o.  Admit date: 05/29/2022 Discharge date: 05/31/2022  Admission Diagnoses: Pelvic Pain. 4.6 cm left hemorrhagic cyst (Principal diagnosis).  History of severe constipation History of recurrent narcotic use  Urinary Tract infection Anxiety Post traumatic stress disorder Adenomyosis  Discharge Diagnoses:  Principal Problem:   H/O ovarian cystectomy Active Problems:   Post-operative pain - Pelvic pain - Constipation - Recurrent narcotic use - Urinary tract infection  - Anxiety - Post Traumatic Stress Disorder - Adenomyosis.   Discharged Condition: good  Hospital Course: 36 y/o Para 1 who presented for a laparoscopic left ovarian cystectomy for pelvic pain and a known left hemorrhagic ovarian cyst.  She underwent the surgery without complications (see op note).  After her surgery she was admitted for better post operative pain control.  Work up for post operative pain was significant for a CT scan that showed her large bowels with a large amount of stool and gas. She received fleet enemas and subsequently had several bowel movements. Alongside scheduled acetaminophen and ibuprofen she requested dilaudid for pain control multiple times and there was concern of drug seeking behavior. She additionally received flexeril and gabapentin for pain control.  Further discussion with the patient revealed she had a long history of recurrent pelvic and abdominal pain causing her to miss work and require narcotics use. Previous work up had shown adenomyosis, pelvic inflammatory disease, constipation and irritable bowel syndrome which were treated but without adequate control of her symptoms.  It was recommended that patient follows up with pain management clinic, gastro enterology and her psychiatrist in the outpatient setting and patient was agreeable. By post op day # 2 in the evening she had better  abdominal and pelvic pain control and she was deemed stable for discharge.     Consults: Palliative care: Recommended follow up with psychiatry and outpatient pain medicine clinic.   Significant Diagnostic Studies:   Narrative & Impression  CLINICAL DATA:  Abdominal pain, post-op. Laparoscopic ovarian cystectomy 05/29/2022.   EXAM: CT ABDOMEN AND PELVIS WITH CONTRAST   TECHNIQUE: Multidetector CT imaging of the abdomen and pelvis was performed using the standard protocol following bolus administration of intravenous contrast.   RADIATION DOSE REDUCTION: This exam was performed according to the departmental dose-optimization program which includes automated exposure control, adjustment of the mA and/or kV according to patient size and/or use of iterative reconstruction technique.   CONTRAST:  Intravenous contrast administered.   COMPARISON:  Abdomen pelvis 03/22/2022, ultrasound pelvis 05/24/2022   FINDINGS: Lower chest: No acute abnormality.   Hepatobiliary:   No adrenal nodule bilaterally.   Bilateral kidneys enhance symmetrically.   No hydronephrosis. No hydroureter.   The urinary bladder is unremarkable.   Pancreas: No focal lesion. Normal pancreatic contour. No surrounding inflammatory changes. No main pancreatic ductal dilatation.   Spleen: Normal in size without focal abnormality.   Adrenals/Urinary Tract:   No adrenal nodule bilaterally.   Bilateral kidneys enhance symmetrically.   No hydronephrosis. No hydroureter.   Foci of gas within the urine bladder lumen likely from recent instrumentation. The urinary bladder is unremarkable.   On delayed imaging, there is no urothelial wall thickening and there are no filling defects in the opacified portions of the bilateral collecting systems or ureters. The distal ureters are not visualized on the delayed imaging due to collimation off view.   Stomach/Bowel: Stool reaches the colon. Stomach is within  normal limits. Increased stool burden  within the ascending and transverse colon. Stool noted throughout the descending colon. No evidence of bowel wall thickening or dilatation. Status post appendectomy.   Vascular/Lymphatic: No abdominal aorta or iliac aneurysm. Mild atherosclerotic plaque of the aorta and its branches. No abdominal, pelvic, or inguinal lymphadenopathy.   Reproductive: Redemonstration of a right posterior uterine wall fibroid measuring 4.8 x 4.4 cm (3:70). Otherwise uterus and bilateral adnexa are unremarkable.   Other: No intraperitoneal free fluid. Trace residual foci of intraperitoneal free gas consistent with postsurgical changes. No organized fluid collection.   Musculoskeletal   Healing umbilical incision. No organized fluid collection. Subcutaneus soft tissue emphysema along the anterior abdominal and pelvic wall likely due to sequelae of laparoscopic approach. Gas is noted to dissect down into the bilateral inguinal regions and left thigh soft tissues.   IMPRESSION: 1. Constipation. Mild gaseous distension of the rectosigmoid colon could represent possible developing ileus. 2. Subcutaneus soft tissue emphysema along the anterior abdominal and pelvic wall likely postoperative changes from laparoscopic approach surgery. Gas noted to dissect down into the bilateral inguinal regions and left thigh soft tissues. Correlate clinically to exclude infection.   Electronically Signed: By: Tish Frederickson M.D. On: 05/30/2022 17:51     CBC    Component Value Date/Time   WBC 15.5 (H) 05/30/2022 0021   RBC 4.03 05/30/2022 0021   HGB 12.4 05/30/2022 0021   HCT 37.0 05/30/2022 0021   PLT 226 05/30/2022 0021   MCV 91.8 05/30/2022 0021   MCH 30.8 05/30/2022 0021   MCHC 33.5 05/30/2022 0021   RDW 11.7 05/30/2022 0021   LYMPHSABS 3.1 05/24/2022 1510   MONOABS 0.6 05/24/2022 1510   EOSABS 0.2 05/24/2022 1510   BASOSABS 0.1 05/24/2022 1510     Treatments:  IV hydration, antibiotics: Cipro, analgesia: acetaminophen, Dilaudid, and ibuprofen, and surgery: laparoscopic ovarian cystectomy.   Discharge Exam: Blood pressure (!) 98/53, pulse 78, temperature 98.5 F (36.9 C), temperature source Oral, resp. rate 18, height 5\' 4"  (1.626 m), weight 73.8 kg, last menstrual period 04/26/2022, SpO2 99 %.    05/31/2022    8:59 AM 05/31/2022    4:52 AM 05/30/2022    8:43 PM  Vitals with BMI  Systolic 98 94 101  Diastolic 53 62 66  Pulse 78 78 84   General: alert, cooperative, and no distress Resp: clear to auscultation bilaterally Cardio: regular rate and rhythm, S1, S2 normal, no murmur, click, rub or gallop GI: abnormal findings:  guarding, incision: clean, dry, and intact, and tender to palpation on diffuse bilateral lower abdomen Extremities: extremities normal, atraumatic, no cyanosis or edema Vaginal Bleeding: none  Disposition: Discharge disposition: 01-Home or Self Care      Allergies as of 05/31/2022       Reactions   Penicillins Anaphylaxis   ++++ABLE TO TAKE ROCEPHIN++++ Has patient had a PCN reaction causing immediate rash, facial/tongue/throat swelling, SOB or lightheadedness with hypotension: yes Has patient had a PCN reaction causing severe rash involving mucus membranes or skin necrosis: unknown Has patient had a PCN reaction that required hospitalization no Has patient had a PCN reaction occurring within the last 10 years: yes If all of the above answers are "NO", then may proceed with Cephalosporin use.   Shrimp [shellfish Allergy] Anaphylaxis   Plecanatide Other (See Comments)   Trulance Pitting edema   Amoxicillin-pot Clavulanate    Clarithromycin Other (See Comments)   Gi -upset   Eggs Or Egg-derived Products Nausea Only   Yolk   Moxifloxacin  Other (See Comments)   Gi- upset   Soy Allergy Nausea Only   Wheat Bran Other (See Comments)   inflammation        Medication List     STOP taking these medications     azithromycin 250 MG tablet Commonly known as: ZITHROMAX   desvenlafaxine 50 MG 24 hr tablet Commonly known as: Pristiq   DULoxetine 30 MG capsule Commonly known as: Cymbalta   DULoxetine 60 MG capsule Commonly known as: Cymbalta       TAKE these medications    albuterol 108 (90 Base) MCG/ACT inhaler Commonly known as: VENTOLIN HFA Inhale 2 puffs into the lungs every 4 (four) hours as needed for shortness of breath.   ALPRAZolam 1 MG tablet Commonly known as: XANAX Take 1 tablet (1 mg total) by mouth 2 (two) times daily as needed. for anxiety What changed: reasons to take this   azelastine 0.1 % nasal spray Commonly known as: ASTELIN Place 1 spray into both nostrils daily. Use in each nostril as directed   ciprofloxacin 500 MG tablet Commonly known as: CIPRO Take 1 tablet (500 mg total) by mouth every 12 (twelve) hours for 7 days.   diclofenac Sodium 1 % Gel Commonly known as: VOLTAREN Apply 1 Application topically daily as needed for pain.   Estarylla 0.25-35 MG-MCG tablet Generic drug: norgestimate-ethinyl estradiol Take 1 tablet by mouth daily.   gabapentin 600 MG tablet Commonly known as: NEURONTIN Take 1 tablet (600 mg total) by mouth at bedtime. What changed:  when to take this reasons to take this   HYDROmorphone 4 MG tablet Commonly known as: DILAUDID Take 1 tablet (4 mg total) by mouth 3 (three) times daily as needed for severe pain. What changed: when to take this   hydrOXYzine 10 MG tablet Commonly known as: ATARAX TAKE 1 TABLET(10 MG) BY MOUTH EVERY 6 HOURS AS NEEDED What changed: See the new instructions.   ibuprofen 800 MG tablet Commonly known as: ADVIL Take 1 tablet (800 mg total) by mouth every 8 (eight) hours as needed for moderate pain or cramping.   loratadine 10 MG tablet Commonly known as: CLARITIN Take 10 mg by mouth daily.   senna 8.6 MG tablet Commonly known as: SENOKOT Take 2 tablets by mouth daily.   SUMAtriptan 50  MG tablet Commonly known as: IMITREX Take 50 mg by mouth as needed for migraine.   topiramate 25 MG tablet Commonly known as: TOPAMAX Take 100 mg by mouth at bedtime.        Follow-up Information     Hoover Browns, MD. Go on 06/14/2022.   Specialty: Obstetrics and Gynecology Why: For 2 week postoperative check. Contact information: [address] [place] [address] [phone]         Hoover Browns, MD. Go on 07/10/2022.   Specialty: Obstetrics and Gynecology Why: For 6 week postoperative check. Contact information: [address] [place] [address] [phone]                Office will schedule patient and call her with appointments for  Gastroenterology for recurrent chronic constipation.  Pain management clinic for further pelvic and abdominal pain management.  Signed: Prescilla Sours, MD.  05/31/2022, 4:26 PM

## 2022-06-05 ENCOUNTER — Telehealth: Payer: Self-pay | Admitting: Physician Assistant

## 2022-06-05 NOTE — Telephone Encounter (Signed)
See note I sent about her last no show and termination. She is asking to set up telehealth and also needs Rfs on Gabapentin and Hydroxyzine. She has had multiple hospitalizations and complications from surgery. [id number] [place], [place] - [address] AT [facility] [address]  [address], [place] [address]

## 2022-06-06 ENCOUNTER — Other Ambulatory Visit: Payer: Self-pay | Admitting: Physician Assistant

## 2022-06-06 MED ORDER — HYDROXYZINE HCL 10 MG PO TABS
10.0000 mg | ORAL_TABLET | Freq: Four times a day (QID) | ORAL | 0 refills | Status: DC | PRN
Start: 2022-06-06 — End: 2022-07-24

## 2022-06-06 MED ORDER — GABAPENTIN 600 MG PO TABS: 600.0000 mg | ORAL_TABLET | Freq: Every day | ORAL | 0 refills | Status: DC

## 2022-06-06 NOTE — Telephone Encounter (Signed)
Yes she can schedule an appointment.  I sent in prescriptions for the gabapentin and hydroxyzine.

## 2022-07-09 ENCOUNTER — Other Ambulatory Visit: Payer: Self-pay | Admitting: Physician Assistant

## 2022-07-09 NOTE — Telephone Encounter (Signed)
Please schedule appt

## 2022-07-17 ENCOUNTER — Telehealth: Payer: Self-pay | Admitting: Physician Assistant

## 2022-07-18 NOTE — Telephone Encounter (Signed)
Please schedule appt

## 2022-07-18 NOTE — Telephone Encounter (Signed)
Patient last seen in March with RTC in 6 weeks. Has had multiple no shows or cancellations.

## 2022-07-18 NOTE — Telephone Encounter (Signed)
LVM to schedule appt

## 2022-07-24 ENCOUNTER — Telehealth: Payer: Self-pay | Admitting: Physician Assistant

## 2022-07-24 ENCOUNTER — Other Ambulatory Visit: Payer: Self-pay

## 2022-07-24 MED ORDER — HYDROXYZINE HCL 10 MG PO TABS: 10.0000 mg | ORAL_TABLET | Freq: Four times a day (QID) | ORAL | 0 refills | Status: DC | PRN

## 2022-07-24 NOTE — Telephone Encounter (Signed)
Rx sent 

## 2022-07-24 NOTE — Telephone Encounter (Signed)
Patient called for refill on Hudroxyzin 10mg . Ph: [phone] Appt 11/29 pharmacy [place]

## 2022-07-30 ENCOUNTER — Telehealth: Payer: Self-pay | Admitting: Physician Assistant

## 2022-07-30 ENCOUNTER — Other Ambulatory Visit: Payer: Self-pay | Admitting: Obstetrics & Gynecology

## 2022-07-30 ENCOUNTER — Other Ambulatory Visit: Payer: Self-pay | Admitting: Physician Assistant

## 2022-07-30 DIAGNOSIS — R102 Pelvic and perineal pain: Secondary | ICD-10-CM

## 2022-07-30 NOTE — Telephone Encounter (Signed)
She was a no show for the last couple of appts.Also are you aware on 10/10 she got xanax from another provider?on 8/14 as well

## 2022-07-30 NOTE — Telephone Encounter (Signed)
Pt requesting Rx for Alprazolam [facility] [employer] [facility] [place] Apt  11/29

## 2022-07-30 NOTE — Telephone Encounter (Signed)
Thanks Vivien Rota. Deny Rx, she needs to get from Dr. Gordy Levan who prescribed it last month.

## 2022-08-01 ENCOUNTER — Other Ambulatory Visit: Payer: Self-pay

## 2022-08-01 ENCOUNTER — Telehealth: Payer: Self-pay | Admitting: Physician Assistant

## 2022-08-01 ENCOUNTER — Ambulatory Visit
Admission: RE | Admit: 2022-08-01 | Discharge: 2022-08-01 | Disposition: A | Discharge status: Home / Self Care | Payer: BLUE CROSS/BLUE SHIELD | Source: Ambulatory Visit | Attending: Obstetrics & Gynecology | Admitting: Obstetrics & Gynecology

## 2022-08-01 DIAGNOSIS — R102 Pelvic and perineal pain: Secondary | ICD-10-CM

## 2022-08-01 NOTE — Telephone Encounter (Signed)
Pt called asking for a refill on her xanax. Her next appt is 11/29. Pharmacy is [facility] on [address] in [place]

## 2022-08-05 ENCOUNTER — Telehealth: Payer: Self-pay | Admitting: Physician Assistant

## 2022-08-05 NOTE — Telephone Encounter (Signed)
Refill refused by [facility]. Patient has gotten the past 2 refills from another provider.

## 2022-08-05 NOTE — Telephone Encounter (Signed)
Pt has appt 11/29.

## 2022-08-05 NOTE — Telephone Encounter (Signed)
Patient called in for refill on Alprazolam 1mg . Ph: [phone] Appt 11/29 Pharmacy [facility] [address] [place], [place]

## 2022-08-21 ENCOUNTER — Ambulatory Visit: Payer: BLUE CROSS/BLUE SHIELD | Admitting: Physician Assistant

## 2022-09-02 ENCOUNTER — Other Ambulatory Visit: Payer: Self-pay | Admitting: Physician Assistant

## 2022-09-12 ENCOUNTER — Encounter: Payer: Self-pay | Admitting: Physician Assistant

## 2022-09-12 ENCOUNTER — Ambulatory Visit (INDEPENDENT_AMBULATORY_CARE_PROVIDER_SITE_OTHER): Payer: Medicaid Other | Admitting: Physician Assistant

## 2022-09-12 DIAGNOSIS — F4323 Adjustment disorder with mixed anxiety and depressed mood: Secondary | ICD-10-CM | POA: Diagnosis not present

## 2022-09-12 DIAGNOSIS — F4310 Post-traumatic stress disorder, unspecified: Secondary | ICD-10-CM | POA: Diagnosis not present

## 2022-09-12 DIAGNOSIS — R69 Illness, unspecified: Secondary | ICD-10-CM | POA: Diagnosis not present

## 2022-09-12 MED ORDER — DULOXETINE HCL 60 MG PO CPEP: 60.0000 mg | ORAL_CAPSULE | Freq: Every day | ORAL | 1 refills | Status: DC

## 2022-09-12 MED ORDER — ALPRAZOLAM 1 MG PO TABS
1.0000 mg | ORAL_TABLET | Freq: Two times a day (BID) | ORAL | 1 refills | Status: DC | PRN
Start: 2022-09-12 — End: 2022-11-29

## 2022-09-12 MED ORDER — DULOXETINE HCL 30 MG PO CPEP: 30.0000 mg | ORAL_CAPSULE | Freq: Every day | ORAL | 0 refills | Status: DC

## 2022-09-12 NOTE — Progress Notes (Signed)
[facility] Med Check  Patient ID: [name],  MRN: [id number]  PCP: Hoover Browns, MD  Date of Evaluation: 09/12/2022 Time spent:30 minutes  Chief Complaint:  Chief Complaint   Anxiety; Follow-up    HISTORY/CURRENT STATUS: 8 months overdue for routine med check, 15 mins late for appt.  Hasn't worked since June. Had a 'surgery gone wrong' Sept 6th, cyst on left ovary, 'gas leaked out in the abdominal wall. They're not sure sure what happened, but I hurt all the time.  They are trying to get me on disability.  Now I have Raynaud's syndrome, several abnormal discs in my back and chronic pain in my abdomen and back.'  She was on narcotics for a while but they did not really help the pain.  Now on Gabapentin and Celebrex. She is under the care of a pain specialist.  Hasn't taken Cymbalta in awhile. Feels mildly depressed. Situational 'because I don't feel like doing anything I used to do.' Sad at times. Low energy and motivation. If/when she does things around the house, she has to rest for 2-3 days. It wears her out and causes more abd and back pain.Doesn't do anything during the day except watch tv or something. Sleeps fair to good, depending on the night and her pain level. Doesn't cry easily. No SI/HI.  Anxiety is still an issue. Xanax helps. She gets overwhelmed easily and if she doesn't have the xanax, she gets panicky. Gets very anxious when driving as she did today. She avoids it altogether when possible.   Patient denies increased energy with decreased need for sleep, increased talkativeness, racing thoughts, impulsivity or risky behaviors, increased spending, increased libido, grandiosity, increased irritability or anger, paranoia, or hallucinations.  Review of Systems  Constitutional: Negative.   HENT: Negative.    Eyes: Negative.   Respiratory: Negative.    Cardiovascular: Negative.   Gastrointestinal: Negative.   Genitourinary: Negative.   Musculoskeletal:         See HPI and records on chart  Skin: Negative.   Neurological:        See HPI and records on chart  Endo/Heme/Allergies: Negative.   Psychiatric/Behavioral:         See HPI   Individual Medical History/ Review of Systems: Changes? :Yes  see HPI and records on chart.   Past medications for mental health diagnoses include: Celexa, Klonopin, prazosin, Seroquel, Ativan, Lexapro, Pristiq  Allergies: Penicillins, Shrimp [shellfish allergy], Plecanatide, Amoxicillin-pot clavulanate, Clarithromycin, Eggs or egg-derived products, Moxifloxacin, Soy allergy, and Wheat bran  Current Medications:  Current Outpatient Medications:    albuterol (VENTOLIN HFA) 108 (90 Base) MCG/ACT inhaler, Inhale 2 puffs into the lungs every 4 (four) hours as needed for shortness of breath., Disp: , Rfl:    AMITIZA 24 MCG capsule, Take 24 mcg by mouth 2 (two) times daily., Disp: , Rfl:    azelastine (ASTELIN) 0.1 % nasal spray, Place 1 spray into both nostrils daily. Use in each nostril as directed, Disp: , Rfl:    celecoxib (CELEBREX) 200 MG capsule, 200 mg 2 (two) times daily., Disp: , Rfl:    diclofenac Sodium (VOLTAREN) 1 % GEL, Apply 1 Application topically daily as needed for pain., Disp: , Rfl:    DULoxetine (CYMBALTA) 30 MG capsule, Take 1 capsule (30 mg total) by mouth daily., Disp: 14 capsule, Rfl: 0   DULoxetine (CYMBALTA) 60 MG capsule, Take 1 capsule (60 mg total) by mouth daily. Starting after 2 wks on 30 mg daily., Disp: 30  capsule, Rfl: 1   ESTARYLLA 0.25-35 MG-MCG tablet, Take 1 tablet by mouth daily., Disp: , Rfl:    gabapentin (NEURONTIN) 600 MG tablet, Take 1 tablet (600 mg total) by mouth at bedtime. (Patient taking differently: Take 600 mg by mouth 3 (three) times daily.), Disp: 30 tablet, Rfl: 0   hydrOXYzine (ATARAX) 10 MG tablet, TAKE 1 TABLET(10 MG) BY MOUTH EVERY 6 HOURS AS NEEDED, Disp: 60 tablet, Rfl: 0   loratadine (CLARITIN) 10 MG tablet, Take 10 mg by mouth daily., Disp: , Rfl:    senna  (SENOKOT) 8.6 MG tablet, Take 2 tablets by mouth daily., Disp: , Rfl:    SUMAtriptan (IMITREX) 50 MG tablet, Take 50 mg by mouth as needed for migraine., Disp: , Rfl:    topiramate (TOPAMAX) 25 MG tablet, Take 100 mg by mouth at bedtime., Disp: , Rfl:    ALPRAZolam (XANAX) 1 MG tablet, Take 1 tablet (1 mg total) by mouth 2 (two) times daily as needed. for anxiety, Disp: 60 tablet, Rfl: 1   HYDROmorphone (DILAUDID) 4 MG tablet, Take 1 tablet (4 mg total) by mouth 3 (three) times daily as needed for severe pain. (Patient not taking: Reported on 09/12/2022), Disp: 15 tablet, Rfl: 0   ibuprofen (ADVIL) 800 MG tablet, Take 1 tablet (800 mg total) by mouth every 8 (eight) hours as needed for moderate pain or cramping., Disp: 30 tablet, Rfl: 0 Medication Side Effects: none  Family Medical/ Social History: Changes? Not working since June 2023.  MENTAL HEALTH EXAM:  There were no vitals taken for this visit.There is no height or weight on file to calculate BMI.  General Appearance: Casual and Well Groomed  Eye Contact:  Good  Speech:  Clear and Coherent and Normal Rate  Volume:  Normal  Mood:  Euthymic  Affect:  Congruent  Thought Process:  Goal Directed and Descriptions of Associations: Circumstantial  Orientation:  Full (Time, Place, and Person)  Thought Content: Logical   Suicidal Thoughts:  No  Homicidal Thoughts:  No  Memory:  WNL  Judgement:  Good  Insight:  Good  Psychomotor Activity:  Normal  Concentration:  Concentration: Good and Attention Span: Good  Recall:  Good  Fund of Knowledge: Good  Language: Good  Assets:  Desire for Improvement Financial Resources/Insurance Housing Transportation  ADL's:  Intact  Cognition: WNL  Prognosis:  Good   Brief review of labs, notes, imaging results since 11/2021 when I last saw her.   DIAGNOSES:    ICD-10-CM   1. Situational mixed anxiety and depressive disorder  F43.23     2. PTSD (post-traumatic stress disorder)  F43.10     3.  Chronic illness  R69      Receiving Psychotherapy: No   RECOMMENDATIONS:  PDMP was reviewed.  Last Xanax filled 08/08/2022.  Gabapentin filled 08/29/2022. I provided 30 minutes of face to face time during this encounter, including time spent before and after the visit in records review, medical decision making, counseling pertinent to today's visit, and charting.   Recommend restarting Cymbalta. It was effective for depression in the past and I think will also benefit the chronic pain now. We disc ways to help w/ situational anxiety/depression, hobbies like reading or some type of craft that doesn't require movement that may aggravate her physical issues.   Continue Xanax 1 mg p.o. twice daily as needed.   Restart Cymbalta 30 mg, 1 p.o. daily for 2 weeks then increase to 60 mg daily. Continue Gabapentin 600  mg, 1 tid per pain management.  Continue hydroxyzine 10 mg, 1-2 every 8 hours as needed anxiety. Recommend therapy.  Return in 6 weeks.  Melony Overly, PA-C

## 2022-09-15 ENCOUNTER — Encounter (HOSPITAL_COMMUNITY): Payer: Self-pay

## 2022-09-15 ENCOUNTER — Other Ambulatory Visit: Payer: Self-pay

## 2022-09-15 ENCOUNTER — Emergency Department (HOSPITAL_COMMUNITY)
Admission: EM | Admit: 2022-09-15 | Discharge: 2022-09-16 | Disposition: A | Discharge status: Home / Self Care | Payer: Medicaid Other | Attending: Emergency Medicine | Admitting: Emergency Medicine

## 2022-09-15 DIAGNOSIS — T7840XA Allergy, unspecified, initial encounter: Secondary | ICD-10-CM | POA: Insufficient documentation

## 2022-09-15 DIAGNOSIS — Z87891 Personal history of nicotine dependence: Secondary | ICD-10-CM | POA: Diagnosis not present

## 2022-09-15 DIAGNOSIS — R21 Rash and other nonspecific skin eruption: Secondary | ICD-10-CM | POA: Diagnosis present

## 2022-09-15 MED ORDER — DIPHENHYDRAMINE HCL 25 MG PO CAPS
50.0000 mg | ORAL_CAPSULE | Freq: Once | ORAL | Status: AC
  Administered 2022-09-15: 50 mg via ORAL
  Filled 2022-09-15: qty 2

## 2022-09-15 NOTE — ED Triage Notes (Signed)
Pt presents with possible allergic reaction from unknown source. Pt states that she has hives on various parts of body and sore throat. Denies any swelling of through of difficulty breathing.

## 2022-09-16 LAB — PREGNANCY, URINE: Preg Test, Ur: NEGATIVE

## 2022-09-16 MED ORDER — FAMOTIDINE 20 MG PO TABS
20.0000 mg | ORAL_TABLET | Freq: Once | ORAL | Status: AC
  Administered 2022-09-16: 20 mg via ORAL
  Filled 2022-09-16: qty 1

## 2022-09-16 MED ORDER — EPINEPHRINE 0.3 MG/0.3ML IJ SOAJ: 0.3000 mg | INTRAMUSCULAR | 0 refills | Status: DC | PRN

## 2022-09-16 MED ORDER — FAMOTIDINE 20 MG PO TABS: 20.0000 mg | ORAL_TABLET | Freq: Two times a day (BID) | ORAL | 0 refills | Status: AC | PRN

## 2022-09-16 MED ORDER — DEXAMETHASONE SODIUM PHOSPHATE 10 MG/ML IJ SOLN
10.0000 mg | Freq: Once | INTRAMUSCULAR | Status: AC
  Administered 2022-09-16: 10 mg via INTRAMUSCULAR
  Filled 2022-09-16: qty 1

## 2022-09-16 MED ORDER — DIPHENHYDRAMINE HCL 25 MG PO TABS: 25.0000 mg | ORAL_TABLET | Freq: Four times a day (QID) | ORAL | 0 refills | Status: AC | PRN

## 2022-09-16 NOTE — Discharge Instructions (Signed)
You were evaluated in the Emergency Department and after careful evaluation, we did not find any emergent condition requiring admission or further testing in the hospital.  Your exam/testing today is overall reassuring.  Rash likely due to an allergic reaction.  Use the Benadryl and Pepcid as needed, use the EpiPen only for severe reactions causing throat closing or facial swelling or severe trouble breathing.  Please return to the Emergency Department if you experience any worsening of your condition.   Thank you for allowing [place] to be a part of your care.

## 2022-09-16 NOTE — ED Provider Notes (Signed)
[facility] Emergency Department Provider Note MRN:  [id number]  Arrival date & time: 09/16/22     Chief Complaint   Allergic Reaction   History of Present Illness   [name] is a 36 y.o. year-old female with a history of multiple allergies presenting to the ED with chief complaint of allergic reaction.  Total body itchy rash, thinks maybe she is having an allergic reaction.  Has a history of autoimmune urticaria, history of multiple seasonal allergies.  Denies any new exposures.  No throat closing or facial swelling, no shortness of breath, no vomiting or diarrhea  Review of Systems  A thorough review of systems was obtained and all systems are negative except as noted in the HPI and PMH.   Patient's Health History    Past Medical History:  Diagnosis Date   ADHD (attention deficit hyperactivity disorder)    Allergy    seasonal   Anxiety    Arthritis    Aspergillosis ([place])    sinus   Chest pain    none since February 2023   Chronic idiopathic urticaria    Colon disorder    right ascending colon blockage since May of 0000000   Complication of anesthesia 2015   woke up early, when tube was removal, "I had blood all over my teeth and face, I could not catch my breath"  This was at [facility], Nov 2018 had surgery at [facility].   Dermatographism    Duodenitis    GERD (gastroesophageal reflux disease)    Headache    Herpes    History of kidney stones    Palpitations    history of palpitations right after Covid- none since not working in July 2023   PCOS (polycystic ovarian syndrome)    PID (pelvic inflammatory disease)    Pneumonia    PONV (postoperative nausea and vomiting)    vomiting after tonsillectomy, 2008   Raynaud's disease    Small bowel obstruction [facility])    June 2023- resolved, small intestine collapse- resolved   TMJ (temporomandibular joint disorder)    August 2023    Past Surgical History:  Procedure Laterality Date    APPENDECTOMY     CERVICAL CERCLAGE     ESOPHAGOGASTRODUODENOSCOPY     KNEE SURGERY Right 2007   LAPAROSCOPIC APPENDECTOMY N/A 08/16/2017   Procedure: APPENDECTOMY LAPAROSCOPIC;  Surgeon: Excell Seltzer, MD;  Location: [facility];  Service: General;  Laterality: N/A;   LAPAROSCOPIC LYSIS OF ADHESIONS N/A 10/31/2016   Procedure: LAPAROSCOPIC LYSIS OF ADHESIONS;  Surgeon: Waymon Amato, MD;  Location: [facility];  Service: Gynecology;  Laterality: N/A;   LAPAROSCOPIC OVARIAN CYSTECTOMY N/A 05/29/2022   Procedure: LAPAROSCOPIC LEFT OVARIAN CYSTECTOMY WITH LYSIS OF ADHESIONS;  Surgeon: Waymon Amato, MD;  Location: [place];  Service: Gynecology;  Laterality: N/A;   LAPAROSCOPY N/A 10/31/2016   Procedure: LAPAROSCOPY DIAGNOSTIC;  Surgeon: Waymon Amato, MD;  Location: [facility];  Service: Gynecology;  Laterality: N/A;   NASAL SINUS SURGERY     TONSILLECTOMY  2008   WISDOM TOOTH EXTRACTION      Family History  Problem Relation Age of Onset   Allergies Sister    Allergies Mother    Asthma Mother    Allergies Father     Social History   Socioeconomic History   Marital status: Single    Spouse name: Not on file   Number of children: 1   Years of education: Not on file   Highest education level:  Not on file  Occupational History   Occupation: phlebotomy    Employer: [employer]  Tobacco Use   Smoking status: Former    Packs/day: 1.00    Years: 1.00    Total pack years: 1.00    Types: Cigarettes    Quit date: 05/24/2016    Years since quitting: 6.3   Smokeless tobacco: Never  Vaping Use   Vaping Use: Former   Quit date: 04/23/2022  Substance and Sexual Activity   Alcohol use: No    Alcohol/week: 0.0 standard drinks of alcohol   Drug use: No   Sexual activity: Yes    Comment: [place]  Other Topics Concern   Not on file  Social History Narrative   Single - 1 son  born 2008   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[employer]   2 caffeine/day   08/09/2016   Social Determinants of Health   Financial Resource Strain: Not on  file  Food Insecurity: Not on file  Transportation Needs: Not on file  Physical Activity: Not on file  Stress: Not on file  Social Connections: Not on file  Intimate Partner Violence: Not on file     Physical Exam   Vitals:   09/15/22 2214 09/15/22 2345  BP: (!) 130/99 (!) 133/98  Pulse: (!) 112 (!) 102  Resp: 20 16  Temp: 97.8 F (36.6 C)   SpO2: 100% 97%    CONSTITUTIONAL: Well-appearing, NAD NEURO/PSYCH:  Alert and oriented x 3, no focal deficits EYES:  eyes equal and reactive ENT/NECK:  no LAD, no JVD CARDIO: Regular rate, well-perfused, normal S1 and S2 PULM:  CTAB no wheezing or rhonchi GI/GU:  non-distended, non-tender MSK/SPINE:  No gross deformities, no edema SKIN: Macular erythematous rash to arms, legs, torso   *Additional and/or pertinent findings included in MDM below  Diagnostic and Interventional Summary    EKG Interpretation  Date/Time:    Ventricular Rate:    PR Interval:    QRS Duration:   QT Interval:    QTC Calculation:   R Axis:     Text Interpretation:         Labs Reviewed  PREGNANCY, URINE    No orders to display    Medications  diphenhydrAMINE (BENADRYL) capsule 50 mg (50 mg Oral Given 09/15/22 2341)  dexamethasone (DECADRON) injection 10 mg (10 mg Intramuscular Given 09/16/22 0037)  famotidine (PEPCID) tablet 20 mg (20 mg Oral Given 09/16/22 0036)     Procedures  /  Critical Care Procedures  ED Course and Medical Decision Making  Initial Impression and Ddx Suspect allergic type rash from allergies, no symptoms to suggest anaphylaxis or angioedema.  Providing antihistamines, steroids, anticipating discharge.  Past medical/surgical history that increases complexity of ED encounter: History of multiple allergies, history of autoimmune urticaria  Interpretation of Diagnostics Laboratory and/or imaging options to aid in the diagnosis/care of the patient were considered.  After careful history and physical examination, it was  determined that there was no indication for diagnostics at this time.  Patient Reassessment and Ultimate Disposition/Management     On reassessment patient is not worsening, very comfortable appearing, normal vital signs, appropriate for discharge.  Patient management required discussion with the following services or consulting groups:  None  Complexity of Problems Addressed Acute complicated illness or Injury  Additional Data Reviewed and Analyzed Further history obtained from: Further history from spouse/family member  Additional Factors Impacting ED Encounter Risk Prescriptions  Barth Kirks. Sedonia Small, [place]  [facility] [email or web address]  Final Clinical Impressions(s) / ED Diagnoses     ICD-10-CM   1. Allergic reaction, initial encounter  T78.40XA       ED Discharge Orders          Ordered    diphenhydrAMINE (BENADRYL) 25 MG tablet  Every 6 hours PRN        09/16/22 0121    famotidine (PEPCID) 20 MG tablet  Every 12 hours PRN        09/16/22 0122    EPINEPHrine 0.3 mg/0.3 mL IJ SOAJ injection  As needed        09/16/22 0122             Discharge Instructions Discussed with and Provided to Patient:     Discharge Instructions      You were evaluated in the Emergency Department and after careful evaluation, we did not find any emergent condition requiring admission or further testing in the hospital.  Your exam/testing today is overall reassuring.  Rash likely due to an allergic reaction.  Use the Benadryl and Pepcid as needed, use the EpiPen only for severe reactions causing throat closing or facial swelling or severe trouble breathing.  Please return to the Emergency Department if you experience any worsening of your condition.   Thank you for allowing [place] to be a part of your care.       Sabas Sous, MD 09/16/22 [phone]

## 2022-09-30 DIAGNOSIS — G8929 Other chronic pain: Secondary | ICD-10-CM | POA: Insufficient documentation

## 2022-10-01 ENCOUNTER — Other Ambulatory Visit: Payer: Self-pay | Admitting: Physician Assistant

## 2022-10-08 DIAGNOSIS — K594 Anal spasm: Secondary | ICD-10-CM

## 2022-10-08 HISTORY — DX: Anal spasm: K59.4

## 2022-10-24 ENCOUNTER — Ambulatory Visit (INDEPENDENT_AMBULATORY_CARE_PROVIDER_SITE_OTHER): Payer: Self-pay | Admitting: Physician Assistant

## 2022-10-24 DIAGNOSIS — Z91199 Patient's noncompliance with other medical treatment and regimen due to unspecified reason: Secondary | ICD-10-CM

## 2022-10-24 NOTE — Progress Notes (Signed)
No show

## 2022-11-07 DIAGNOSIS — N809 Endometriosis, unspecified: Secondary | ICD-10-CM

## 2022-11-07 DIAGNOSIS — N8003 Adenomyosis of the uterus: Secondary | ICD-10-CM | POA: Insufficient documentation

## 2022-11-07 HISTORY — DX: Endometriosis, unspecified: N80.9

## 2022-11-21 ENCOUNTER — Other Ambulatory Visit: Payer: Self-pay | Admitting: Physician Assistant

## 2022-11-21 NOTE — Telephone Encounter (Signed)
Please call to schedule, was a no show last visit

## 2022-11-22 NOTE — Telephone Encounter (Signed)
Lvm to schedule f/u

## 2022-11-25 NOTE — Telephone Encounter (Signed)
Pt has scheduled appt 3/8.  Pt is asking for hydroxyzine and gabapentin RF.   To [facility] [id number] - [address], [address] AT [address]

## 2022-11-27 ENCOUNTER — Other Ambulatory Visit: Payer: Self-pay | Admitting: Physician Assistant

## 2022-11-28 NOTE — Telephone Encounter (Signed)
Has appt with Helene Kelp today

## 2022-11-29 ENCOUNTER — Telehealth (INDEPENDENT_AMBULATORY_CARE_PROVIDER_SITE_OTHER): Payer: Medicaid Other | Admitting: Physician Assistant

## 2022-11-29 ENCOUNTER — Encounter: Payer: Self-pay | Admitting: Physician Assistant

## 2022-11-29 DIAGNOSIS — F411 Generalized anxiety disorder: Secondary | ICD-10-CM

## 2022-11-29 DIAGNOSIS — G2581 Restless legs syndrome: Secondary | ICD-10-CM

## 2022-11-29 DIAGNOSIS — F3341 Major depressive disorder, recurrent, in partial remission: Secondary | ICD-10-CM

## 2022-11-29 MED ORDER — ALPRAZOLAM 1 MG PO TABS: 1.0000 mg | ORAL_TABLET | Freq: Two times a day (BID) | ORAL | 5 refills | Status: DC | PRN

## 2022-11-29 MED ORDER — DULOXETINE HCL 60 MG PO CPEP
60.0000 mg | ORAL_CAPSULE | Freq: Every day | ORAL | 5 refills | Status: AC
Start: 2022-11-29 — End: ?

## 2022-11-29 MED ORDER — HYDROXYZINE HCL 10 MG PO TABS: 10.0000 mg | ORAL_TABLET | Freq: Three times a day (TID) | ORAL | 5 refills | Status: AC | PRN

## 2022-11-29 NOTE — Progress Notes (Signed)
[facility] Med Check  Patient ID: [name],  MRN: [id number]  PCP: Waymon Amato, MD  Date of Evaluation: 12/08/2022 Time spent:25 minutes  Chief Complaint:  Chief Complaint   Anxiety; Depression; Follow-up   Virtual Visit via Telehealth  I connected with patient by  telephone, with their informed consent, and verified patient privacy and that I am speaking with the correct person using two identifiers.  I am private, in my office and the patient is at home.  I discussed the limitations, risks, security and privacy concerns of performing an evaluation and management service by telephone and the availability of in person appointments. I also discussed with the patient that there may be a patient responsible charge related to this service. The patient expressed understanding and agreed to proceed.   I discussed the assessment and treatment plan with the patient. The patient was provided an opportunity to ask questions and all were answered. The patient agreed with the plan and demonstrated an understanding of the instructions.   The patient was advised to call back or seek an in-person evaluation if the symptoms worsen or if the condition fails to improve as anticipated.  I provided 25  minutes of non-face-to-face time during this encounter.  HISTORY/CURRENT STATUS: Routine med check. 2 months overdue.  Mental health is stable. Not working d/t physical issues. See notes on chart. Anxiety is controlled. Still needs Xanax, if she doesn't take it, gets panicky although nl fullblown PA. Hydroxyzine is effective sometimes, which decreases need for Xanax as often.   Patient is able to enjoy things.  Energy and motivation are good.  No extreme sadness, tearfulness, or feelings of hopelessness.  Sleeps well most of the time. ADLs and personal hygiene are normal.   Denies any changes in concentration, making decisions, or remembering things.  Denies suicidal or homicidal  thoughts.  Patient denies increased energy with decreased need for sleep, increased talkativeness, racing thoughts, impulsivity or risky behaviors, increased spending, increased libido, grandiosity, increased irritability or anger, paranoia, or hallucinations.  Denies dizziness, syncope, seizures, numbness, tingling, tremor, tics, unsteady gait, slurred speech, confusion. Denies muscle or joint pain, stiffness, or dystonia.  Individual Medical History/ Review of Systems: Changes? :Yes  GU   Past medications for mental health diagnoses include: Celexa, Klonopin, prazosin, Seroquel, Ativan, Lexapro, Pristiq  Allergies: Penicillins, Shrimp [shellfish allergy], Plecanatide, Amoxicillin-pot clavulanate, Clarithromycin, Egg-derived products, Moxifloxacin, Soy allergy, and Wheat  Current Medications:  Current Outpatient Medications:    albuterol (VENTOLIN HFA) 108 (90 Base) MCG/ACT inhaler, Inhale 2 puffs into the lungs every 4 (four) hours as needed for shortness of breath., Disp: , Rfl:    azelastine (ASTELIN) 0.1 % nasal spray, Place 1 spray into both nostrils daily. Use in each nostril as directed, Disp: , Rfl:    celecoxib (CELEBREX) 200 MG capsule, 200 mg 2 (two) times daily., Disp: , Rfl:    diclofenac Sodium (VOLTAREN) 1 % GEL, Apply 1 Application topically daily as needed for pain., Disp: , Rfl:    diphenhydrAMINE (BENADRYL) 25 MG tablet, Take 1 tablet (25 mg total) by mouth every 6 (six) hours as needed for itching., Disp: 20 tablet, Rfl: 0   EPINEPHrine 0.3 mg/0.3 mL IJ SOAJ injection, Inject 0.3 mg into the muscle as needed for anaphylaxis., Disp: 1 each, Rfl: 0   famotidine (PEPCID) 20 MG tablet, Take 1 tablet (20 mg total) by mouth every 12 (twelve) hours as needed (rash)., Disp: 30 tablet, Rfl: 0   gabapentin (NEURONTIN) 600 MG  tablet, Take 1 tablet (600 mg total) by mouth at bedtime. (Patient taking differently: Take 600 mg by mouth 3 (three) times daily.), Disp: 30 tablet, Rfl: 0    loratadine (CLARITIN) 10 MG tablet, Take 10 mg by mouth daily., Disp: , Rfl:    senna (SENOKOT) 8.6 MG tablet, Take 2 tablets by mouth daily., Disp: , Rfl:    SUMAtriptan (IMITREX) 50 MG tablet, Take 50 mg by mouth as needed for migraine., Disp: , Rfl:    topiramate (TOPAMAX) 25 MG tablet, Take 100 mg by mouth at bedtime., Disp: , Rfl:    ALPRAZolam (XANAX) 1 MG tablet, Take 1 tablet (1 mg total) by mouth 2 (two) times daily as needed. for anxiety, Disp: 60 tablet, Rfl: 5   AMITIZA 24 MCG capsule, Take 24 mcg by mouth 2 (two) times daily. (Patient not taking: Reported on 11/29/2022), Disp: , Rfl:    DULoxetine (CYMBALTA) 60 MG capsule, Take 1 capsule (60 mg total) by mouth daily., Disp: 30 capsule, Rfl: 5   ESTARYLLA 0.25-35 MG-MCG tablet, Take 1 tablet by mouth daily. (Patient not taking: Reported on 11/29/2022), Disp: , Rfl:    HYDROmorphone (DILAUDID) 4 MG tablet, Take 1 tablet (4 mg total) by mouth 3 (three) times daily as needed for severe pain. (Patient not taking: Reported on 09/12/2022), Disp: 15 tablet, Rfl: 0   hydrOXYzine (ATARAX) 10 MG tablet, Take 1-2 tablets (10-20 mg total) by mouth every 8 (eight) hours as needed., Disp: 60 tablet, Rfl: 5   ibuprofen (ADVIL) 800 MG tablet, Take 1 tablet (800 mg total) by mouth every 8 (eight) hours as needed for moderate pain or cramping. (Patient not taking: Reported on 11/29/2022), Disp: 30 tablet, Rfl: 0 Medication Side Effects: none  Family Medical/ Social History: Changes? Not working since June 2023.  MENTAL HEALTH EXAM:  There were no vitals taken for this visit.There is no height or weight on file to calculate BMI.  General Appearance: Casual and Well Groomed  Eye Contact:  Good  Speech:  Clear and Coherent and Normal Rate  Volume:  Normal  Mood:  Euthymic  Affect:  Congruent  Thought Process:  Goal Directed and Descriptions of Associations: Circumstantial  Orientation:  Full (Time, Place, and Person)  Thought Content: Logical   Suicidal  Thoughts:  No  Homicidal Thoughts:  No  Memory:  WNL  Judgement:  Good  Insight:  Good  Psychomotor Activity:  Normal  Concentration:  Concentration: Good and Attention Span: Good  Recall:  Good  Fund of Knowledge: Good  Language: Good  Assets:  Communication Skills Desire for Improvement Financial Resources/Insurance Housing Transportation  ADL's:  Intact  Cognition: WNL  Prognosis:  Good   DIAGNOSES:    ICD-10-CM   1. Recurrent major depressive disorder, in partial remission ([employer])  F33.41     2. Generalized anxiety disorder  F41.1     3. Restless leg syndrome  G25.81       Receiving Psychotherapy: No   RECOMMENDATIONS:  PDMP was reviewed.  Last Xanax filled 11/23/2022. Gabapentin filled 10/17/2022. I provided 25 minutes of non-face-to-face time during this encounter, including time spent before and after the visit in records review, medical decision making, counseling pertinent to today's visit, and charting.   Doing well w/ mental health meds. No changes needed.   Continue Xanax 1 mg p.o. twice daily as needed.   Continue Cymbalta  60 mg daily. Continue Gabapentin 600 mg, 1 tid per pain management.  Continue hydroxyzine 10  mg, 1-2 every 8 hours as needed anxiety. Recommend therapy.  Return in 6 months.  Donnal Moat, PA-C

## 2022-12-16 ENCOUNTER — Other Ambulatory Visit: Payer: Self-pay | Admitting: Physician Assistant

## 2022-12-17 DIAGNOSIS — N898 Other specified noninflammatory disorders of vagina: Secondary | ICD-10-CM | POA: Insufficient documentation

## 2022-12-17 DIAGNOSIS — M79604 Pain in right leg: Secondary | ICD-10-CM | POA: Insufficient documentation

## 2022-12-17 DIAGNOSIS — R202 Paresthesia of skin: Secondary | ICD-10-CM | POA: Insufficient documentation

## 2022-12-20 ENCOUNTER — Other Ambulatory Visit: Payer: Self-pay | Admitting: Behavioral Health

## 2022-12-20 DIAGNOSIS — F411 Generalized anxiety disorder: Secondary | ICD-10-CM

## 2022-12-20 MED ORDER — ALPRAZOLAM 1 MG PO TABS: 1.0000 mg | ORAL_TABLET | Freq: Two times a day (BID) | ORAL | 0 refills | Status: DC | PRN

## 2022-12-20 NOTE — Progress Notes (Signed)
Pt called in to after hours emergency number. Patient not sure what happened this month but says her anxiety has been more severe. I reviewed last note. Patient has 6 year hx with Donnal Moat. Pt says she has never asked for early fill but does not have enough xanax to last till Monday.  Says she will call Monday to schedule early appt with Helene Kelp. Sent Rx for  #60, xanax 1 mg tablet twice daily. Requested pharmacy approve 3 day early fill.

## 2022-12-26 ENCOUNTER — Emergency Department (HOSPITAL_COMMUNITY)
Admission: EM | Admit: 2022-12-26 | Discharge: 2022-12-26 | Disposition: A | Discharge status: Home / Self Care | Payer: Medicaid Other | Attending: Emergency Medicine | Admitting: Emergency Medicine

## 2022-12-26 ENCOUNTER — Other Ambulatory Visit: Payer: Self-pay

## 2022-12-26 ENCOUNTER — Encounter (HOSPITAL_COMMUNITY): Payer: Self-pay | Admitting: Emergency Medicine

## 2022-12-26 DIAGNOSIS — T7840XA Allergy, unspecified, initial encounter: Secondary | ICD-10-CM | POA: Diagnosis not present

## 2022-12-26 DIAGNOSIS — R21 Rash and other nonspecific skin eruption: Secondary | ICD-10-CM | POA: Diagnosis present

## 2022-12-26 LAB — BASIC METABOLIC PANEL
Anion gap: 6 (ref 5–15)
BUN: 20 mg/dL (ref 6–20)
CO2: 22 mmol/L (ref 22–32)
Calcium: 8.1 mg/dL — ABNORMAL LOW (ref 8.9–10.3)
Chloride: 107 mmol/L (ref 98–111)
Creatinine, Ser: 0.86 mg/dL (ref 0.44–1.00)
GFR, Estimated: >60 mL/min (ref >60)
Glucose, Bld: 95 mg/dL (ref 70–99)
Potassium: 3.5 mmol/L (ref 3.5–5.1)
Sodium: 135 mmol/L (ref 135–145)

## 2022-12-26 LAB — CBC WITH DIFFERENTIAL/PLATELET
Abs Immature Granulocytes: 0.02 10*3/uL (ref 0.00–0.07)
Basophils Absolute: 0.1 10*3/uL (ref 0.0–0.1)
Basophils Relative: 1 %
Eosinophils Absolute: 0.3 10*3/uL (ref 0.0–0.5)
Eosinophils Relative: 3 %
HCT: 40.5 % (ref 36.0–46.0)
Hemoglobin: 13.4 g/dL (ref 12.0–15.0)
Immature Granulocytes: 0 %
Lymphocytes Relative: 31 %
Lymphs Abs: 2.6 10*3/uL (ref 0.7–4.0)
MCH: 31.4 pg (ref 26.0–34.0)
MCHC: 33.1 g/dL (ref 30.0–36.0)
MCV: 94.8 fL (ref 80.0–100.0)
Monocytes Absolute: 0.5 10*3/uL (ref 0.1–1.0)
Monocytes Relative: 5 %
Neutro Abs: 4.9 10*3/uL (ref 1.7–7.7)
Neutrophils Relative %: 60 %
Platelets: 251 10*3/uL (ref 150–400)
RBC: 4.27 MIL/uL (ref 3.87–5.11)
RDW: 13.1 % (ref 11.5–15.5)
WBC: 8.3 10*3/uL (ref 4.0–10.5)
nRBC: 0 % (ref 0.0–0.2)

## 2022-12-26 MED ORDER — DIPHENHYDRAMINE HCL 50 MG/ML IJ SOLN
25.0000 mg | Freq: Once | INTRAMUSCULAR | Status: AC
  Administered 2022-12-26: 25 mg via INTRAVENOUS
  Filled 2022-12-26: qty 1

## 2022-12-26 MED ORDER — METHYLPREDNISOLONE SODIUM SUCC 125 MG IJ SOLR
125.0000 mg | Freq: Once | INTRAMUSCULAR | Status: AC
  Administered 2022-12-26: 125 mg via INTRAVENOUS
  Filled 2022-12-26: qty 2

## 2022-12-26 MED ORDER — EPINEPHRINE 0.3 MG/0.3ML IJ SOAJ: 0.3000 mg | INTRAMUSCULAR | 0 refills | Status: AC | PRN

## 2022-12-26 MED ORDER — FAMOTIDINE IN NACL 20-0.9 MG/50ML-% IV SOLN
20.0000 mg | Freq: Once | INTRAVENOUS | Status: AC
  Administered 2022-12-26: 20 mg via INTRAVENOUS
  Filled 2022-12-26: qty 50

## 2022-12-26 NOTE — ED Provider Notes (Signed)
[facility] Provider Note   CSN: [id number] Arrival date & time: 12/26/22  1308     History  Chief Complaint  Patient presents with   Allergic Reaction    [name] is a 37 y.o. female.  37 year old female with a history of multiple allergies, seasonal allergies, and autoimmune urticaria who presents emergency department with concerns for allergic reaction.  Says that last night she tried a new toothpaste and afterwards started feeling like she was having an allergic reaction.  Took some antihistamines for itching and a rash.  Today started to feel like her tongue and lip were swollen so she took her EpiPen at noon today.  Says that she was also having shortness of breath, dizziness, and nausea.  Says that her tongue and lips still feel odd.  Reports that the shortness of breath and rash have improved though.  No other known allergens that she was exposed to.        Home Medications Prior to Admission medications   Medication Sig Start Date End Date Taking? Authorizing Provider  albuterol (VENTOLIN HFA) 108 (90 Base) MCG/ACT inhaler Inhale 2 puffs into the lungs every 4 (four) hours as needed for shortness of breath. 03/03/22   [provider]  ALPRAZolam Duanne Moron) 1 MG tablet Take 1 tablet (1 mg total) by mouth 2 (two) times daily as needed. for anxiety 12/20/22   Lesle Chris A, NP  AMITIZA 24 MCG capsule Take 24 mcg by mouth 2 (two) times daily. Patient not taking: Reported on 11/29/2022    [provider]  azelastine (ASTELIN) 0.1 % nasal spray Place 1 spray into both nostrils daily. Use in each nostril as directed    [provider]  celecoxib (CELEBREX) 200 MG capsule 200 mg 2 (two) times daily. 07/29/22   [provider]  diclofenac Sodium (VOLTAREN) 1 % GEL Apply 1 Application topically daily as needed for pain. 03/03/22   [provider]  diphenhydrAMINE (BENADRYL) 25 MG tablet Take 1  tablet (25 mg total) by mouth every 6 (six) hours as needed for itching. 09/16/22   Maudie Flakes, MD  DULoxetine (CYMBALTA) 60 MG capsule Take 1 capsule (60 mg total) by mouth daily. 11/29/22   Donnal Moat T, PA-C  EPINEPHrine 0.3 mg/0.3 mL IJ SOAJ injection Inject 0.3 mg into the muscle as needed for anaphylaxis. 12/26/22   Fransico Meadow, MD  ESTARYLLA 0.25-35 MG-MCG tablet Take 1 tablet by mouth daily. Patient not taking: Reported on 11/29/2022 10/23/21   [provider]  famotidine (PEPCID) 20 MG tablet Take 1 tablet (20 mg total) by mouth every 12 (twelve) hours as needed (rash). 09/16/22   Maudie Flakes, MD  gabapentin (NEURONTIN) 600 MG tablet Take 1 tablet (600 mg total) by mouth at bedtime. Patient taking differently: Take 600 mg by mouth 3 (three) times daily. 06/06/22   Donnal Moat T, PA-C  HYDROmorphone (DILAUDID) 4 MG tablet Take 1 tablet (4 mg total) by mouth 3 (three) times daily as needed for severe pain. Patient not taking: Reported on 09/12/2022 05/29/22   Waymon Amato, MD  hydrOXYzine (ATARAX) 10 MG tablet Take 1-2 tablets (10-20 mg total) by mouth every 8 (eight) hours as needed. 11/29/22   Donnal Moat T, PA-C  ibuprofen (ADVIL) 800 MG tablet Take 1 tablet (800 mg total) by mouth every 8 (eight) hours as needed for moderate pain or cramping. Patient not taking: Reported on 11/29/2022 05/29/22  Waymon Amato, MD  loratadine (CLARITIN) 10 MG tablet Take 10 mg by mouth daily.    [provider]  senna (SENOKOT) 8.6 MG tablet Take 2 tablets by mouth daily.    [provider]  SUMAtriptan (IMITREX) 50 MG tablet Take 50 mg by mouth as needed for migraine. 05/26/22   [provider]  topiramate (TOPAMAX) 25 MG tablet Take 100 mg by mouth at bedtime. 04/17/22   [provider]      Allergies    Penicillins, Shrimp [shellfish allergy], Plecanatide, Amoxicillin-pot clavulanate, Clarithromycin, Egg-derived products, Moxifloxacin, Soy allergy, and  Wheat    Review of Systems   Review of Systems  Physical Exam Updated Vital Signs BP 103/71   Pulse 81   Temp 97.6 F (36.4 C) (Oral)   Resp 18   Ht 5\' 4"  (1.626 m)   Wt 72.6 kg   SpO2 100%   BMI 27.46 kg/m  Physical Exam Vitals and nursing note reviewed.  Constitutional:      General: She is not in acute distress.    Appearance: She is well-developed.  HENT:     Head: Normocephalic and atraumatic.     Right Ear: External ear normal.     Left Ear: External ear normal.     Nose: Nose normal.     Mouth/Throat:     Comments: Uvula midline and not swollen.  No lip swelling or tongue swelling. Eyes:     Extraocular Movements: Extraocular movements intact.     Conjunctiva/sclera: Conjunctivae normal.     Pupils: Pupils are equal, round, and reactive to light.  Cardiovascular:     Rate and Rhythm: Normal rate and regular rhythm.     Heart sounds: No murmur heard. Pulmonary:     Effort: Pulmonary effort is normal. No respiratory distress.     Breath sounds: Normal breath sounds. No stridor. No rales.  Musculoskeletal:     Cervical back: Normal range of motion and neck supple.     Right lower leg: No edema.     Left lower leg: No edema.  Skin:    General: Skin is warm and dry.     Findings: Rash (Does have several small papules on her chest but no other rash on her trunk or extremities.) present.  Neurological:     Mental Status: She is alert and oriented to person, place, and time. Mental status is at baseline.  Psychiatric:        Mood and Affect: Mood normal.     ED Results / Procedures / Treatments   Labs (all labs ordered are listed, but only abnormal results are displayed) Labs Reviewed  BASIC METABOLIC PANEL - Abnormal; Notable for the following components:      Result Value   Calcium 8.1 (*)    All other components within normal limits  CBC WITH DIFFERENTIAL/PLATELET    EKG None  Radiology No results found.  Procedures Procedures   Medications  Ordered in ED Medications  methylPREDNISolone sodium succinate (SOLU-MEDROL) 125 mg/2 mL injection 125 mg (125 mg Intravenous Given 12/26/22 1734)  famotidine (PEPCID) IVPB 20 mg premix (0 mg Intravenous Stopped 12/26/22 1846)  diphenhydrAMINE (BENADRYL) injection 25 mg (25 mg Intravenous Given 12/26/22 1734)    ED Course/ Medical Decision Making/ A&P                             Medical Decision Making Amount and/or Complexity of Data  Reviewed Labs: ordered.  Risk Prescription drug management.   SADIA FARONE is a 37 y.o. female with comorbidities that complicate the patient evaluation including multiple allergies, seasonal allergies, autoimmune urticaria who presented to the emergency department with allergic reaction  Initial Ddx:  Anaphylaxis, angioedema, mild allergic reaction  MDM:  Concern the patient may have had a severe allergic reaction causing lip and tongue swelling.  Did get EpiPen at home.  Does not have any signs of severe reaction at this time so we will give her steroids and antihistamines.  No angioedema noted and reports that she does not have a history of this.  Plan:  IV Solu-Medrol Pepcid Benadryl  ED Summary/Re-evaluation:  Patient observed in the emergency department and did not require any additional doses of epinephrine.  Was feeling much better and was requesting to go home.  Patient was discharged home with additional prescription of an EpiPen as well as instructions to follow-up with an allergist and her primary doctor in several days.  This patient presents to the ED for concern of complaints listed in HPI, this involves an extensive number of treatment options, and is a complaint that carries with it a high risk of complications and morbidity. Disposition including potential need for admission considered.   Dispo: DC Home. Return precautions discussed including, but not limited to, those listed in the AVS. Allowed pt time to ask questions which were  answered fully prior to dc.  Additional history obtained from significant other Records reviewed Outpatient Clinic Notes The following labs were independently interpreted: Chemistry and show no acute abnormality I personally reviewed and interpreted cardiac monitoring: normal sinus rhythm  I personally reviewed and interpreted the pt's EKG: see above for interpretation   Final Clinical Impression(s) / ED Diagnoses Final diagnoses:  Allergic reaction, initial encounter    Rx / DC Orders ED Discharge Orders          Ordered    EPINEPHrine 0.3 mg/0.3 mL IJ SOAJ injection  As needed        12/26/22 1727              Fransico Meadow, MD 12/26/22 2037

## 2022-12-26 NOTE — ED Provider Triage Note (Signed)
Emergency Medicine Provider Triage Evaluation Note  [name], a 37 y.o. female with allergies to soy, egg yolk, wheat, and penicillin was evaluated in triage.  Pt complains of allergic reaction to unknown exposure.  Last night started developing a rash and took antihistamines.  Today started feeling like her tongue and lips were swelling along with the right side of her neck and took IM epinephrine at 1205 this afternoon. Says she was having SOB, dizziness, and nausea. Still feels like she is having tongue swelling.   Physical Exam  BP 125/79 (BP Location: Right Arm)   Pulse 99   Temp (!) 97.5 F (36.4 C) (Oral)   Resp (!) 24   Ht 5\' 4"  (1.626 m)   Wt 72.6 kg   SpO2 100%   BMI 27.46 kg/m  Gen:   Awake, no distress HENT:   No uvular or tongue swelling  Resp:  Normal effort, no stridor or wheezing MSK:   Moves extremities without difficulty Other:  Mild rash on chest  Medical Decision Making  Medically screening exam initiated at 4:48 PM.  Appropriate orders placed.  [name] was informed that the remainder of the evaluation will be completed by another provider, this initial triage assessment does not replace that evaluation, and the importance of remaining in the ED until their evaluation is complete.  Will bring back to room when available to treat for possible anaphylaxis vs angioedema. However, does not appear to be in acute distress at this time.    Alexandra Meadow, MD 12/26/22 [phone]

## 2022-12-26 NOTE — Discharge Instructions (Signed)
You were seen for your allergic reaction in the emergency department.   At home, please fill the prescription for the EpiPen that we have given you.    Follow-up with your primary doctor in 2-3 days regarding your visit.  Follow-up with an allergist regarding your allergies to try to determine what caused this.  Please follow-up with the orthopedic doctor about your hip pain.  Return immediately to the emergency department if you experience any of the following: Difficulty breathing, rash, vomiting, or any other concerning symptoms.    Thank you for visiting our Emergency Department. It was a pleasure taking care of you today.

## 2022-12-26 NOTE — ED Triage Notes (Signed)
Pt states started having allergic reaction last night, but when she woke up today she felt like she could not breathe. Pt took her epi pen about one hour ago.

## 2022-12-31 ENCOUNTER — Encounter (HOSPITAL_COMMUNITY): Payer: Self-pay | Admitting: Emergency Medicine

## 2022-12-31 ENCOUNTER — Other Ambulatory Visit: Payer: Self-pay

## 2022-12-31 ENCOUNTER — Emergency Department (HOSPITAL_COMMUNITY)
Admission: EM | Admit: 2022-12-31 | Discharge: 2023-01-01 | Disposition: A | Discharge status: Home / Self Care | Payer: Medicaid Other | Attending: Emergency Medicine | Admitting: Emergency Medicine

## 2022-12-31 DIAGNOSIS — K5641 Fecal impaction: Secondary | ICD-10-CM | POA: Diagnosis not present

## 2022-12-31 DIAGNOSIS — R109 Unspecified abdominal pain: Secondary | ICD-10-CM | POA: Diagnosis present

## 2022-12-31 DIAGNOSIS — K5900 Constipation, unspecified: Secondary | ICD-10-CM

## 2022-12-31 LAB — I-STAT BETA HCG BLOOD, ED (MC, WL, AP ONLY): I-stat hCG, quantitative: <5 m[IU]/mL (ref <5)

## 2022-12-31 LAB — URINALYSIS, ROUTINE W REFLEX MICROSCOPIC
Bilirubin Urine: NEGATIVE
Glucose, UA: NEGATIVE mg/dL
Ketones, ur: 5 mg/dL — AB
Nitrite: POSITIVE — AB
Protein, ur: 30 mg/dL — AB
Specific Gravity, Urine: 1.031 — ABNORMAL HIGH (ref 1.005–1.030)
pH: 5 (ref 5.0–8.0)

## 2022-12-31 LAB — CBC
HCT: 46 % (ref 36.0–46.0)
Hemoglobin: 15.7 g/dL — ABNORMAL HIGH (ref 12.0–15.0)
MCH: 31.1 pg (ref 26.0–34.0)
MCHC: 34.1 g/dL (ref 30.0–36.0)
MCV: 91.1 fL (ref 80.0–100.0)
Platelets: 328 10*3/uL (ref 150–400)
RBC: 5.05 MIL/uL (ref 3.87–5.11)
RDW: 12.7 % (ref 11.5–15.5)
WBC: 9.6 10*3/uL (ref 4.0–10.5)
nRBC: 0 % (ref 0.0–0.2)

## 2022-12-31 LAB — LIPASE, BLOOD: Lipase: 26 U/L (ref 11–51)

## 2022-12-31 LAB — COMPREHENSIVE METABOLIC PANEL
ALT: 31 U/L (ref 0–44)
AST: 23 U/L (ref 15–41)
Albumin: 4.2 g/dL (ref 3.5–5.0)
Alkaline Phosphatase: 76 U/L (ref 38–126)
Anion gap: 10 (ref 5–15)
BUN: 13 mg/dL (ref 6–20)
CO2: 24 mmol/L (ref 22–32)
Calcium: 9.1 mg/dL (ref 8.9–10.3)
Chloride: 102 mmol/L (ref 98–111)
Creatinine, Ser: 0.97 mg/dL (ref 0.44–1.00)
GFR, Estimated: >60 mL/min (ref >60)
Glucose, Bld: 128 mg/dL — ABNORMAL HIGH (ref 70–99)
Potassium: 4.2 mmol/L (ref 3.5–5.1)
Sodium: 136 mmol/L (ref 135–145)
Total Bilirubin: 0.7 mg/dL (ref 0.3–1.2)
Total Protein: 7.3 g/dL (ref 6.5–8.1)

## 2022-12-31 NOTE — ED Triage Notes (Signed)
Pt via POV c/o RLQ pain and right flank pain with bloating, n/v, recently treated at [facility] for pyelonephritis. Hx SBO with numerous sequelae. Pt appears uncomfortable in triage.

## 2023-01-01 ENCOUNTER — Emergency Department (HOSPITAL_COMMUNITY): Payer: Medicaid Other

## 2023-01-01 MED ORDER — HYDROMORPHONE HCL 1 MG/ML IJ SOLN
1.0000 mg | Freq: Once | INTRAMUSCULAR | Status: AC
  Administered 2023-01-01: 1 mg via INTRAVENOUS
  Filled 2023-01-01: qty 1

## 2023-01-01 MED ORDER — SODIUM CHLORIDE 0.9 % IV SOLN
2.0000 g | Freq: Once | INTRAVENOUS | Status: AC
  Administered 2023-01-01: 2 g via INTRAVENOUS
  Filled 2023-01-01: qty 20

## 2023-01-01 MED ORDER — ONDANSETRON HCL 4 MG/2ML IJ SOLN
4.0000 mg | Freq: Once | INTRAMUSCULAR | Status: AC
  Administered 2023-01-01: 4 mg via INTRAVENOUS
  Filled 2023-01-01: qty 2

## 2023-01-01 MED ORDER — LACTATED RINGERS IV BOLUS
1000.0000 mL | Freq: Once | INTRAVENOUS | Status: AC
  Administered 2023-01-01: 1000 mL via INTRAVENOUS

## 2023-01-01 MED ORDER — IOHEXOL 350 MG/ML SOLN
75.0000 mL | Freq: Once | INTRAVENOUS | Status: AC | PRN
  Administered 2023-01-01: 75 mL via INTRAVENOUS

## 2023-01-01 NOTE — ED Provider Notes (Signed)
[place] [facility] Provider Note   CSN: [id number] Arrival date & time: 12/31/22  2149     History {Add pertinent medical, surgical, social history, OB history to HPI:1} Chief Complaint  Patient presents with   Abdominal Pain    Lenox PondsHeather M Estes is a 37 y.o. female.  37 year old female here with her boytfriend. She has a history of small bowel obstruction, pyelonephritis, and fibroids who presents ER today secondary to abdominal pain and bloating.  Patient states that she has had decreased bowel movements for about a week and no bowel movements in 4 days.  She states decreased flatulence over the last couple days.  No fevers.  Some nausea no emesis.  No shortness of breath.  Has been told in the past that she has adhesions causing her issues in her ascending and descending colon.  [facility] suspected pyelonephritis that was CT negative.   Abdominal Pain      Home Medications Prior to Admission medications   Medication Sig Start Date End Date Taking? Authorizing Provider  albuterol (VENTOLIN HFA) 108 (90 Base) MCG/ACT inhaler Inhale 2 puffs into the lungs every 4 (four) hours as needed for shortness of breath. 03/03/22   [provider]  ALPRAZolam Prudy Feeler(XANAX) 1 MG tablet Take 1 tablet (1 mg total) by mouth 2 (two) times daily as needed. for anxiety 12/20/22   Avelina LaineWhite, Brian A, NP  AMITIZA 24 MCG capsule Take 24 mcg by mouth 2 (two) times daily. Patient not taking: Reported on 11/29/2022    [provider]  azelastine (ASTELIN) 0.1 % nasal spray Place 1 spray into both nostrils daily. Use in each nostril as directed    [provider]  celecoxib (CELEBREX) 200 MG capsule 200 mg 2 (two) times daily. 07/29/22   [provider]  diclofenac Sodium (VOLTAREN) 1 % GEL Apply 1 Application topically daily as needed for pain. 03/03/22   [provider]  diphenhydrAMINE (BENADRYL) 25 MG  tablet Take 1 tablet (25 mg total) by mouth every 6 (six) hours as needed for itching. 09/16/22   Sabas SousBero, Michael M, MD  DULoxetine (CYMBALTA) 60 MG capsule Take 1 capsule (60 mg total) by mouth daily. 11/29/22   Melony OverlyHurst, Teresa T, PA-C  EPINEPHrine 0.3 mg/0.3 mL IJ SOAJ injection Inject 0.3 mg into the muscle as needed for anaphylaxis. 12/26/22   Rondel BatonPaterson, Robert C, MD  ESTARYLLA 0.25-35 MG-MCG tablet Take 1 tablet by mouth daily. Patient not taking: Reported on 11/29/2022 10/23/21   [provider]  famotidine (PEPCID) 20 MG tablet Take 1 tablet (20 mg total) by mouth every 12 (twelve) hours as needed (rash). 09/16/22   Sabas SousBero, Michael M, MD  gabapentin (NEURONTIN) 600 MG tablet Take 1 tablet (600 mg total) by mouth at bedtime. Patient taking differently: Take 600 mg by mouth 3 (three) times daily. 06/06/22   Melony OverlyHurst, Teresa T, PA-C  HYDROmorphone (DILAUDID) 4 MG tablet Take 1 tablet (4 mg total) by mouth 3 (three) times daily as needed for severe pain. Patient not taking: Reported on 09/12/2022 05/29/22   Hoover BrownsKulwa, Ema, MD  hydrOXYzine (ATARAX) 10 MG tablet Take 1-2 tablets (10-20 mg total) by mouth every 8 (eight) hours as needed. 11/29/22   Melony OverlyHurst, Teresa T, PA-C  ibuprofen (ADVIL) 800 MG tablet Take 1 tablet (800 mg total) by mouth every 8 (eight) hours as needed for moderate pain or cramping. Patient not taking: Reported on 11/29/2022 05/29/22  Hoover Browns, MD  loratadine (CLARITIN) 10 MG tablet Take 10 mg by mouth daily.    [provider]  senna (SENOKOT) 8.6 MG tablet Take 2 tablets by mouth daily.    [provider]  SUMAtriptan (IMITREX) 50 MG tablet Take 50 mg by mouth as needed for migraine. 05/26/22   [provider]  topiramate (TOPAMAX) 25 MG tablet Take 100 mg by mouth at bedtime. 04/17/22   [provider]      Allergies    Penicillins, Shrimp [shellfish allergy], Plecanatide, Amoxicillin-pot clavulanate, Clarithromycin, Egg-derived products, Moxifloxacin, Soy  allergy, and Wheat    Review of Systems   Review of Systems  Gastrointestinal:  Positive for abdominal pain.    Physical Exam Updated Vital Signs BP (!) 131/102 (BP Location: Right Arm)   Pulse (!) 108   Temp 98 F (36.7 C) (Oral)   Resp 20   Ht 5\' 4"  (1.626 m)   Wt 72.6 kg   LMP 08/23/2022 (Approximate)   SpO2 100%   BMI 27.46 kg/m  Physical Exam Vitals and nursing note reviewed.  Constitutional:      Appearance: She is well-developed.  HENT:     Head: Normocephalic and atraumatic.  Cardiovascular:     Rate and Rhythm: Regular rhythm. Tachycardia present.  Pulmonary:     Effort: No respiratory distress.     Breath sounds: No stridor.  Abdominal:     General: There is no distension.     Tenderness: There is abdominal tenderness.  Musculoskeletal:     Cervical back: Normal range of motion.  Skin:    General: Skin is warm and dry.  Neurological:     General: No focal deficit present.     Mental Status: She is alert.     ED Results / Procedures / Treatments   Labs (all labs ordered are listed, but only abnormal results are displayed) Labs Reviewed  COMPREHENSIVE METABOLIC PANEL - Abnormal; Notable for the following components:      Result Value   Glucose, Bld 128 (*)    All other components within normal limits  CBC - Abnormal; Notable for the following components:   Hemoglobin 15.7 (*)    All other components within normal limits  URINALYSIS, ROUTINE W REFLEX MICROSCOPIC - Abnormal; Notable for the following components:   Color, Urine AMBER (*)    APPearance HAZY (*)    Specific Gravity, Urine 1.031 (*)    Hgb urine dipstick MODERATE (*)    Ketones, ur 5 (*)    Protein, ur 30 (*)    Nitrite POSITIVE (*)    Leukocytes,Ua SMALL (*)    Bacteria, UA RARE (*)    All other components within normal limits  URINE CULTURE  LIPASE, BLOOD  I-STAT BETA HCG BLOOD, ED ([facility], [facility], [facility] ONLY)    EKG None  Radiology No results found.  Procedures Procedures   {Document cardiac monitor, telemetry assessment procedure when appropriate:1}  Medications Ordered in ED Medications  HYDROmorphone (DILAUDID) injection 1 mg (has no administration in time range)  lactated ringers bolus 1,000 mL (has no administration in time range)  ondansetron (ZOFRAN) injection 4 mg (has no administration in time range)  cefTRIAXone (ROCEPHIN) 2 g in sodium chloride 0.9 % 100 mL IVPB (has no administration in time range)    ED Course/ Medical Decision Making/ A&P   {   Click here for ABCD2, HEART and other calculatorsREFRESH Note before signing :1}  Medical Decision Making Amount and/or Complexity of Data Reviewed Labs: ordered. Radiology: ordered.  Risk Prescription drug management.  Urinalysis shows likely urinary tract infection we will start some fluids antibiotics.  Due to her abdominal distention and her pain with medical history consider also bowel obstruction so we will need a CT scan.  Pain meds and nausea meds started as well. ***  {Document critical care time when appropriate:1} {Document review of labs and clinical decision tools ie heart score, Chads2Vasc2 etc:1}  {Document your independent review of radiology images, and any outside records:1} {Document your discussion with family members, caretakers, and with consultants:1} {Document social determinants of health affecting pt's care:1} {Document your decision making why or why not admission, treatments were needed:1} Final Clinical Impression(s) / ED Diagnoses Final diagnoses:  None    Rx / DC Orders ED Discharge Orders     None

## 2023-02-03 ENCOUNTER — Ambulatory Visit (INDEPENDENT_AMBULATORY_CARE_PROVIDER_SITE_OTHER): Payer: Medicaid Other

## 2023-02-03 ENCOUNTER — Ambulatory Visit
Admission: EM | Admit: 2023-02-03 | Discharge: 2023-02-03 | Disposition: A | Discharge status: Home / Self Care | Payer: Medicaid Other

## 2023-02-03 DIAGNOSIS — M25571 Pain in right ankle and joints of right foot: Secondary | ICD-10-CM

## 2023-02-03 DIAGNOSIS — Z3202 Encounter for pregnancy test, result negative: Secondary | ICD-10-CM

## 2023-02-03 DIAGNOSIS — S93401A Sprain of unspecified ligament of right ankle, initial encounter: Secondary | ICD-10-CM

## 2023-02-03 LAB — POCT URINE PREGNANCY: Preg Test, Ur: NEGATIVE

## 2023-02-03 NOTE — Discharge Instructions (Addendum)
The ankle x-ray today does not show any broken bones, which is great.  Please wear the Ace wrap when you are up walking on your ankle.  When you are sitting down, keep your leg elevated and apply ice 15 minutes on, 45 minutes off every hour while awake.  You can take Tylenol 500 to 1000 mg every 6 hours as needed for pain.  Start the ankle sprain exercises I have attached to this packet.  Follow-up with orthopedic provider with no improvement or worsening of symptoms despite treatment.

## 2023-02-03 NOTE — ED Provider Notes (Addendum)
[facility]    CSN: [id number] Arrival date & time: 02/03/23  0102      History   Chief Complaint Chief Complaint  Patient presents with   Ankle Pain   Knee Pain    HPI [name] is a 37 y.o. female.   Patient presents today for right ankle pain.  She reports approximately 1 week ago, she fell landing on her ankle and leg that was folded in underneath of her.  She reports the ankle has been swollen and painful ever since and is difficult to walk.  She denies new numbness or tingling in the toes or decreased range of motion.  No fevers or nausea/vomiting since the pain began.  Reports she has a history of Raynaud's syndrome and does have some numbness occasionally in the toes that improves with massage.  Patient is tachycardic in triage today.  She reports whenever she is in pain, her heart rate increases and this is "normal" for her.  No chest pain, shortness of breath.  Patient reports history of endometriosis; last menstrual cycle was approximately 6 months ago.  Patient states there is a possibility she could be pregnant.    Past Medical History:  Diagnosis Date   ADHD (attention deficit hyperactivity disorder)    Allergy    seasonal   Anxiety    Arthritis    Aspergillosis (HCC)    sinus   Chest pain    none since February 2023   Chronic idiopathic urticaria    Colon disorder    right ascending colon blockage since May of 2021-present   Complication of anesthesia 2015   woke up early, when tube was removal, "I had blood all over my teeth and face, I could not catch my breath"  This was at [facility], Nov 2018 had surgery at [facility].   Dermatographism    Duodenitis    GERD (gastroesophageal reflux disease)    Headache    Herpes    History of kidney stones    Palpitations    history of palpitations right after Covid- none since not working in July 2023   PCOS (polycystic ovarian syndrome)    PID (pelvic inflammatory disease)    Pneumonia     PONV (postoperative nausea and vomiting)    vomiting after tonsillectomy, 2008   Raynaud's disease    Small bowel obstruction [facility])    June 2023- resolved, small intestine collapse- resolved   TMJ (temporomandibular joint disorder)    August 2023    Patient Active Problem List   Diagnosis Date Noted   Post-operative pain 05/30/2022   H/O ovarian cystectomy 05/29/2022   PID (acute pelvic inflammatory disease) 02/14/2020   PTSD (post-traumatic stress disorder) 07/16/2018   MDD (major depressive disorder) 07/16/2018   Night terrors 07/16/2018   Anxiety state 07/16/2018   Acute appendicitis 08/16/2017   Acute appendicitis with localized peritonitis 08/16/2017   Cough 03/23/2015    Past Surgical History:  Procedure Laterality Date   APPENDECTOMY     CERVICAL CERCLAGE     ESOPHAGOGASTRODUODENOSCOPY     KNEE SURGERY Right 2007   LAPAROSCOPIC APPENDECTOMY N/A 08/16/2017   Procedure: APPENDECTOMY LAPAROSCOPIC;  Surgeon: Glenna Fellows, MD;  Location: [facility];  Service: General;  Laterality: N/A;   LAPAROSCOPIC LYSIS OF ADHESIONS N/A 10/31/2016   Procedure: LAPAROSCOPIC LYSIS OF ADHESIONS;  Surgeon: Hoover Browns, MD;  Location: [facility];  Service: Gynecology;  Laterality: N/A;   LAPAROSCOPIC OVARIAN CYSTECTOMY N/A 05/29/2022   Procedure:  LAPAROSCOPIC LEFT OVARIAN CYSTECTOMY WITH LYSIS OF ADHESIONS;  Surgeon: Hoover Browns, MD;  Location: [facility];  Service: Gynecology;  Laterality: N/A;   LAPAROSCOPY N/A 10/31/2016   Procedure: LAPAROSCOPY DIAGNOSTIC;  Surgeon: Hoover Browns, MD;  Location: [facility];  Service: Gynecology;  Laterality: N/A;   NASAL SINUS SURGERY     TONSILLECTOMY  2008   WISDOM TOOTH EXTRACTION      OB History     Gravida  1   Para  1   Term      Preterm  1   AB      Living  1      SAB      IAB      Ectopic      Multiple      Live Births  1            Home Medications    Prior to Admission medications   Medication Sig Start Date End Date Taking?  Authorizing Provider  ALPRAZolam Prudy Feeler) 1 MG tablet Take 1 tablet (1 mg total) by mouth 2 (two) times daily as needed. for anxiety 12/20/22  Yes Avelina Laine A, NP  azelastine (ASTELIN) 0.1 % nasal spray Place 1 spray into both nostrils daily. Use in each nostril as directed   Yes [provider]  celecoxib (CELEBREX) 200 MG capsule 200 mg 2 (two) times daily. 07/29/22  Yes [provider]  diclofenac Sodium (VOLTAREN) 1 % GEL Apply 1 Application topically daily as needed for pain. 03/03/22  Yes [provider]  diphenhydrAMINE (BENADRYL) 25 MG tablet Take 1 tablet (25 mg total) by mouth every 6 (six) hours as needed for itching. 09/16/22  Yes Sabas Sous, MD  DULoxetine (CYMBALTA) 60 MG capsule Take 1 capsule (60 mg total) by mouth daily. 11/29/22  Yes Hurst, Rosey Bath T, PA-C  gabapentin (NEURONTIN) 600 MG tablet Take 1 tablet (600 mg total) by mouth at bedtime. Patient taking differently: Take 600 mg by mouth 3 (three) times daily. 06/06/22  Yes Melony Overly T, PA-C  hydrOXYzine (ATARAX) 10 MG tablet Take 1-2 tablets (10-20 mg total) by mouth every 8 (eight) hours as needed. 11/29/22  Yes Gwynneth Macleod  LINZESS 290 MCG CAPS capsule  10/01/22  Yes [provider]  loratadine (CLARITIN) 10 MG tablet Take 10 mg by mouth daily.   Yes [provider]  ondansetron (ZOFRAN) 4 MG tablet Take 4 mg by mouth every 8 (eight) hours as needed for nausea or vomiting.   Yes [provider]  senna (SENOKOT) 8.6 MG tablet Take 2 tablets by mouth daily.   Yes [provider]  SUMAtriptan (IMITREX) 50 MG tablet Take 50 mg by mouth as needed for migraine. 05/26/22  Yes [provider]  albuterol (VENTOLIN HFA) 108 (90 Base) MCG/ACT inhaler Inhale 2 puffs into the lungs every 4 (four) hours as needed for shortness of breath. 03/03/22   [provider]  AMITIZA 24 MCG capsule Take 24 mcg by mouth 2 (two) times daily. Patient not taking:  Reported on 11/29/2022    [provider]  EPINEPHrine 0.3 mg/0.3 mL IJ SOAJ injection Inject 0.3 mg into the muscle as needed for anaphylaxis. 12/26/22   Rondel Baton, MD  ESTARYLLA 0.25-35 MG-MCG tablet Take 1 tablet by mouth daily. Patient not taking: Reported on 11/29/2022 10/23/21   [provider]  famotidine (PEPCID) 20 MG tablet Take 1 tablet (20 mg total) by mouth every 12 (twelve) hours  as needed (rash). 09/16/22   Sabas Sous, MD  HYDROmorphone (DILAUDID) 4 MG tablet Take 1 tablet (4 mg total) by mouth 3 (three) times daily as needed for severe pain. Patient not taking: Reported on 09/12/2022 05/29/22   Hoover Browns, MD  ibuprofen (ADVIL) 800 MG tablet Take 1 tablet (800 mg total) by mouth every 8 (eight) hours as needed for moderate pain or cramping. Patient not taking: Reported on 11/29/2022 05/29/22   Hoover Browns, MD  LUPRON DEPOT, 33-MONTH, 11.25 MG injection SMARTSIG:11.25 Milligram(s) SUB-Q Every 3 Months    [provider]  topiramate (TOPAMAX) 25 MG tablet Take 100 mg by mouth at bedtime. 04/17/22   [provider]    Family History Family History  Problem Relation Age of Onset   Allergies Sister    Allergies Mother    Asthma Mother    Allergies Father     Social History Social History   Tobacco Use   Smoking status: Former    Packs/day: 1.00    Years: 1.00    Additional pack years: 0.00    Total pack years: 1.00    Types: Cigarettes    Quit date: 05/24/2016    Years since quitting: 6.7   Smokeless tobacco: Never  Vaping Use   Vaping Use: Former   Quit date: 04/23/2022  Substance Use Topics   Alcohol use: No    Alcohol/week: 0.0 standard drinks of alcohol   Drug use: No     Allergies   Penicillins, Shrimp [shellfish allergy], Plecanatide, Amoxicillin-pot clavulanate, Clarithromycin, Egg-derived products, Moxifloxacin, Soy allergy, and Wheat   Review of Systems Review of Systems Per HPI  Physical Exam Triage Vital  Signs ED Triage Vitals [02/03/23 1117]  Enc Vitals Group     BP (!) 156/107     Pulse Rate (!) 123     Resp 16     Temp 98.2 F (36.8 C)     Temp Source Oral     SpO2 98 %     Weight      Height      Head Circumference      Peak Flow      Pain Score 8     Pain Loc      Pain Edu?      Excl. in GC?    No data found.  Updated Vital Signs BP (!) 156/107 (BP Location: Right Arm)   Pulse (!) 123   Temp 98.2 F (36.8 C) (Oral)   Resp 16   LMP  (LMP Unknown)   SpO2 98%   Visual Acuity Right Eye Distance:   Left Eye Distance:   Bilateral Distance:    Right Eye Near:   Left Eye Near:    Bilateral Near:     Physical Exam Vitals and nursing note reviewed.  Constitutional:      General: She is not in acute distress.    Appearance: Normal appearance. She is not toxic-appearing.  HENT:     Mouth/Throat:     Mouth: Mucous membranes are moist.     Pharynx: Oropharynx is clear.  Pulmonary:     Effort: Pulmonary effort is normal. No respiratory distress.  Musculoskeletal:     Comments: Inspection: Mild swelling to the lateral aspect of the right lateral malleolus diffusely; no obvious deformity, redness, or bruising  Palpation: Right lateral malleolus diffusely tender to palpation; no obvious deformities palpated ROM: Full ROM to right ankle and flexibility of right foot  Strength: Difficult  to assess secondary to pain Neurovascular: neurovascularly intact in left and right lower extremity   Skin:    General: Skin is warm and dry.     Capillary Refill: Capillary refill takes less than 2 seconds.     Coloration: Skin is not jaundiced or pale.     Findings: No erythema.  Neurological:     Mental Status: She is alert and oriented to person, place, and time.  Psychiatric:        Behavior: Behavior is cooperative.      UC Treatments / Results  Labs (all labs ordered are listed, but only abnormal results are displayed) Labs Reviewed  POCT URINE PREGNANCY     EKG   Radiology DG Ankle Complete Right  Result Date: 02/03/2023 CLINICAL DATA:  Recent fall, pain EXAM: RIGHT ANKLE - COMPLETE 3+ VIEW COMPARISON:  None Available. FINDINGS: No displaced fracture or dislocation is seen. There is soft tissue swelling over the lateral malleolus and along the anterior aspect. There are no opaque foreign bodies. IMPRESSION: No displaced fracture or dislocation is seen in right ankle. There is soft tissue swelling over the anterior and lateral aspects. If there are continued symptoms, follow-up radiographic examination in 1-2 weeks may be considered. Electronically Signed   By: Ernie Avena M.D.   On: 02/03/2023 12:02    Procedures Procedures (including critical care time)  Medications Ordered in UC Medications - No data to display  Initial Impression / Assessment and Plan / UC Course  I have reviewed the triage vital signs and the nursing notes.  Pertinent labs & imaging results that were available during my care of the patient were reviewed by me and considered in my medical decision making (see chart for details).   Patient is well-appearing, afebrile, not tachypneic, oxygenating well on room air.  Patient is mildly hypertensive today in urgent care and is tachycardic.  Patient states these vital signs are normal for her when she is in pain.  1. Acute right ankle pain 2. Sprain of right ankle, unspecified ligament, initial encounter Ankle x-ray today is negative for acute bony abnormality Recommended rest, ice, compression, elevation Ace wrap applied today Can use Tylenol for pain ER and return precautions discussed with patient  3. Negative pregnancy test UPT today is negative  The patient was given the opportunity to ask questions.  All questions answered to their satisfaction.  The patient is in agreement to this plan.    Final Clinical Impressions(s) / UC Diagnoses   Final diagnoses:  Acute right ankle pain  Sprain of right ankle,  unspecified ligament, initial encounter  Negative pregnancy test     Discharge Instructions      The ankle x-ray today does not show any broken bones, which is great.  Please wear the Ace wrap when you are up walking on your ankle.  When you are sitting down, keep your leg elevated and apply ice 15 minutes on, 45 minutes off every hour while awake.  You can take Tylenol 500 to 1000 mg every 6 hours as needed for pain.  Start the ankle sprain exercises I have attached to this packet.  Follow-up with orthopedic provider with no improvement or worsening of symptoms despite treatment.    ED Prescriptions   None    PDMP not reviewed this encounter.   Valentino Nose, NP 02/04/23 [id number]    Valentino Nose, NP 02/04/23 [phone]

## 2023-02-03 NOTE — ED Triage Notes (Signed)
Pt states she fell 8 days ago on the ramp at her house and landed on her right ankle and knee. Now having pain and swelling in right ankle and hurts to walk or move.

## 2023-03-26 DIAGNOSIS — M792 Neuralgia and neuritis, unspecified: Secondary | ICD-10-CM | POA: Insufficient documentation

## 2023-03-26 HISTORY — DX: Neuralgia and neuritis, unspecified: M79.2

## 2023-04-18 ENCOUNTER — Other Ambulatory Visit: Payer: Self-pay | Admitting: Orthopaedic Surgery

## 2023-04-19 DIAGNOSIS — M51369 Other intervertebral disc degeneration, lumbar region without mention of lumbar back pain or lower extremity pain: Secondary | ICD-10-CM | POA: Insufficient documentation

## 2023-04-19 DIAGNOSIS — Z87898 Personal history of other specified conditions: Secondary | ICD-10-CM

## 2023-04-19 DIAGNOSIS — S20212A Contusion of left front wall of thorax, initial encounter: Secondary | ICD-10-CM | POA: Insufficient documentation

## 2023-04-19 DIAGNOSIS — Z654 Victim of crime and terrorism: Secondary | ICD-10-CM | POA: Insufficient documentation

## 2023-04-19 DIAGNOSIS — M5020 Other cervical disc displacement, unspecified cervical region: Secondary | ICD-10-CM

## 2023-04-19 DIAGNOSIS — Z87820 Personal history of traumatic brain injury: Secondary | ICD-10-CM | POA: Insufficient documentation

## 2023-04-19 DIAGNOSIS — M06361 Rheumatoid nodule, right knee: Secondary | ICD-10-CM | POA: Insufficient documentation

## 2023-04-19 HISTORY — DX: Rheumatoid nodule, right knee: M06.361

## 2023-04-19 HISTORY — DX: Personal history of traumatic brain injury: Z87.820

## 2023-04-19 HISTORY — DX: Personal history of other specified conditions: Z87.898

## 2023-04-19 HISTORY — DX: Other intervertebral disc degeneration, lumbar region without mention of lumbar back pain or lower extremity pain: M51.369

## 2023-04-19 HISTORY — DX: Other cervical disc displacement, unspecified cervical region: M50.20

## 2023-04-24 ENCOUNTER — Ambulatory Visit: Payer: Medicaid Other | Admitting: Orthopaedic Surgery

## 2023-05-01 ENCOUNTER — Encounter (HOSPITAL_COMMUNITY): Payer: Self-pay

## 2023-05-01 ENCOUNTER — Other Ambulatory Visit: Payer: Self-pay

## 2023-05-01 ENCOUNTER — Emergency Department (HOSPITAL_COMMUNITY)
Admission: EM | Admit: 2023-05-01 | Discharge: 2023-05-01 | Disposition: A | Discharge status: Home / Self Care | Payer: Medicaid Other | Attending: Emergency Medicine | Admitting: Emergency Medicine

## 2023-05-01 DIAGNOSIS — L259 Unspecified contact dermatitis, unspecified cause: Secondary | ICD-10-CM | POA: Diagnosis not present

## 2023-05-01 DIAGNOSIS — R21 Rash and other nonspecific skin eruption: Secondary | ICD-10-CM | POA: Diagnosis present

## 2023-05-01 DIAGNOSIS — L249 Irritant contact dermatitis, unspecified cause: Secondary | ICD-10-CM

## 2023-05-01 MED ORDER — HYDROCORTISONE 1 % EX CREA: TOPICAL_CREAM | CUTANEOUS | 0 refills | Status: AC

## 2023-05-01 MED ORDER — PREDNISONE 20 MG PO TABS
40.0000 mg | ORAL_TABLET | Freq: Once | ORAL | Status: AC
  Administered 2023-05-01: 40 mg via ORAL
  Filled 2023-05-01: qty 2

## 2023-05-01 MED ORDER — PREDNISONE 10 MG PO TABS
20.0000 mg | ORAL_TABLET | Freq: Every day | ORAL | 0 refills | Status: DC
Start: 2023-05-01 — End: 2023-05-05

## 2023-05-01 NOTE — Discharge Instructions (Addendum)
You can take 20 mg of prednisone for the next 4 days.  Apply hydrocortisone cream to the affected areas.  Follow-up with your primary care doctor.

## 2023-05-01 NOTE — ED Triage Notes (Signed)
Pt reports:  Rash Started 3-4 days ago Shoulder, legs, back Pt recently moved Throat pain Left side Hurts to swallow

## 2023-05-01 NOTE — ED Provider Notes (Signed)
[place] EMERGENCY DEPARTMENT AT [facility] Provider Note   CSN: [id number] Arrival date & time: 05/01/23  1058     History  Chief Complaint  Patient presents with   Rash   Sore Throat    [name] is a 37 y.o. female.  This is a 37 year old female who presents emergency department with an itchy rash that started on the dorsum of her right hand, and is also developed spots on her back right shoulder.  She does not have any oral mucosal involvement, or rash on her palms or soles.  Patient says that she is allergic to many things.  She is currently staying with a friend.   Rash Sore Throat       Home Medications Prior to Admission medications   Medication Sig Start Date End Date Taking? Authorizing Provider  albuterol (VENTOLIN HFA) 108 (90 Base) MCG/ACT inhaler Inhale 2 puffs into the lungs every 4 (four) hours as needed for shortness of breath. 03/03/22   [provider]  ALPRAZolam Prudy Feeler) 1 MG tablet Take 1 tablet (1 mg total) by mouth 2 (two) times daily as needed. for anxiety 12/20/22   Avelina Laine A, NP  AMITIZA 24 MCG capsule Take 24 mcg by mouth 2 (two) times daily. Patient not taking: Reported on 11/29/2022    [provider]  azelastine (ASTELIN) 0.1 % nasal spray Place 1 spray into both nostrils daily. Use in each nostril as directed    [provider]  celecoxib (CELEBREX) 200 MG capsule 200 mg 2 (two) times daily. 07/29/22   [provider]  diclofenac Sodium (VOLTAREN) 1 % GEL Apply 1 Application topically daily as needed for pain. 03/03/22   [provider]  diphenhydrAMINE (BENADRYL) 25 MG tablet Take 1 tablet (25 mg total) by mouth every 6 (six) hours as needed for itching. 09/16/22   Sabas Sous, MD  DULoxetine (CYMBALTA) 60 MG capsule Take 1 capsule (60 mg total) by mouth daily. 11/29/22   Melony Overly T, PA-C  EPINEPHrine 0.3 mg/0.3 mL IJ SOAJ injection Inject 0.3 mg into the muscle as needed  for anaphylaxis. 12/26/22   Rondel Baton, MD  ESTARYLLA 0.25-35 MG-MCG tablet Take 1 tablet by mouth daily. Patient not taking: Reported on 11/29/2022 10/23/21   [provider]  famotidine (PEPCID) 20 MG tablet Take 1 tablet (20 mg total) by mouth every 12 (twelve) hours as needed (rash). 09/16/22   Sabas Sous, MD  gabapentin (NEURONTIN) 600 MG tablet Take 1 tablet (600 mg total) by mouth at bedtime. Patient taking differently: Take 600 mg by mouth 3 (three) times daily. 06/06/22   Melony Overly T, PA-C  HYDROmorphone (DILAUDID) 4 MG tablet Take 1 tablet (4 mg total) by mouth 3 (three) times daily as needed for severe pain. Patient not taking: Reported on 09/12/2022 05/29/22   Hoover Browns, MD  hydrOXYzine (ATARAX) 10 MG tablet Take 1-2 tablets (10-20 mg total) by mouth every 8 (eight) hours as needed. 11/29/22   Melony Overly T, PA-C  ibuprofen (ADVIL) 800 MG tablet Take 1 tablet (800 mg total) by mouth every 8 (eight) hours as needed for moderate pain or cramping. Patient not taking: Reported on 11/29/2022 05/29/22   Hoover Browns, MD  Karlene Einstein 290 MCG CAPS capsule  10/01/22   [provider]  loratadine (CLARITIN) 10 MG tablet Take 10 mg by mouth daily.    [provider]  LUPRON DEPOT, 77-MONTH, 11.25 MG injection SMARTSIG:11.25 Milligram(s)  SUB-Q Every 3 Months    [provider]  ondansetron (ZOFRAN) 4 MG tablet Take 4 mg by mouth every 8 (eight) hours as needed for nausea or vomiting.    [provider]  senna (SENOKOT) 8.6 MG tablet Take 2 tablets by mouth daily.    [provider]  SUMAtriptan (IMITREX) 50 MG tablet Take 50 mg by mouth as needed for migraine. 05/26/22   [provider]  topiramate (TOPAMAX) 25 MG tablet Take 100 mg by mouth at bedtime. 04/17/22   [provider]      Allergies    Penicillins, Shrimp [shellfish allergy], Plecanatide, Amoxicillin-pot clavulanate, Clarithromycin, Egg-derived products, Moxifloxacin,  Soy allergy, and Wheat    Review of Systems   Review of Systems  Skin:  Positive for rash.    Physical Exam Updated Vital Signs BP 118/87 (BP Location: Right Arm)   Pulse 100   Temp (!) 97.3 F (36.3 C) (Oral)   Resp 18   Ht 5\' 4"  (1.626 m)   Wt 70.8 kg   SpO2 100%   BMI 26.78 kg/m  Physical Exam Skin:    Comments: On the right posterior shoulder there is scattered blanchable, raised maculopapular rash without weeping, or blistering.  Patient reports that it is itchy.  On the dorsum of the right hand there is a small blister, erythema.  Patient reports it is itchy.     ED Results / Procedures / Treatments   Labs (all labs ordered are listed, but only abnormal results are displayed) Labs Reviewed  GROUP A STREP BY PCR    EKG None  Radiology No results found.  Procedures Procedures    Medications Ordered in ED Medications - No data to display  ED Course/ Medical Decision Making/ A&P                                 Medical Decision Making 37 year old female here today for itchy rash.  Differential diagnoses include contact dermatitis, less likely disseminated infection, less likely [facility] spotted fever, less likely SJS, less likely TE N.  Plan-rash appears to be a simple contact dermatitis.  Will provide with oral steroids, topical steroid cream.  Patient also reported a sore throat.  I do not appreciate any exudates on my exam.  Rash does not appear to be consistent with a viral exanthem.           Final Clinical Impression(s) / ED Diagnoses Final diagnoses:  None    Rx / DC Orders ED Discharge Orders     None         Arletha Pili, DO 05/01/23 1239

## 2023-05-04 ENCOUNTER — Other Ambulatory Visit: Payer: Self-pay | Admitting: Physician Assistant

## 2023-05-05 ENCOUNTER — Ambulatory Visit (INDEPENDENT_AMBULATORY_CARE_PROVIDER_SITE_OTHER): Payer: Medicaid Other | Admitting: Urology

## 2023-05-05 ENCOUNTER — Encounter: Payer: Self-pay | Admitting: Urology

## 2023-05-05 VITALS — BP 118/83 | HR 106 | Temp 98.3°F

## 2023-05-05 DIAGNOSIS — T7491XS Unspecified adult maltreatment, confirmed, sequela: Secondary | ICD-10-CM

## 2023-05-05 DIAGNOSIS — R102 Pelvic and perineal pain: Secondary | ICD-10-CM | POA: Diagnosis not present

## 2023-05-05 DIAGNOSIS — G8929 Other chronic pain: Secondary | ICD-10-CM

## 2023-05-05 DIAGNOSIS — K594 Anal spasm: Secondary | ICD-10-CM

## 2023-05-05 DIAGNOSIS — R350 Frequency of micturition: Secondary | ICD-10-CM

## 2023-05-05 DIAGNOSIS — M6289 Other specified disorders of muscle: Secondary | ICD-10-CM | POA: Diagnosis not present

## 2023-05-05 DIAGNOSIS — Z87442 Personal history of urinary calculi: Secondary | ICD-10-CM | POA: Insufficient documentation

## 2023-05-05 DIAGNOSIS — R1030 Lower abdominal pain, unspecified: Secondary | ICD-10-CM | POA: Diagnosis not present

## 2023-05-05 DIAGNOSIS — R3915 Urgency of urination: Secondary | ICD-10-CM

## 2023-05-05 DIAGNOSIS — L508 Other urticaria: Secondary | ICD-10-CM | POA: Insufficient documentation

## 2023-05-05 DIAGNOSIS — Z87440 Personal history of urinary (tract) infections: Secondary | ICD-10-CM

## 2023-05-05 DIAGNOSIS — M797 Fibromyalgia: Secondary | ICD-10-CM | POA: Insufficient documentation

## 2023-05-05 DIAGNOSIS — R3914 Feeling of incomplete bladder emptying: Secondary | ICD-10-CM

## 2023-05-05 LAB — URINALYSIS, ROUTINE W REFLEX MICROSCOPIC
Bilirubin, UA: NEGATIVE
Glucose, UA: NEGATIVE
Leukocytes,UA: NEGATIVE
Nitrite, UA: NEGATIVE
RBC, UA: NEGATIVE
Specific Gravity, UA: 1.03 (ref 1.005–1.030)
Urobilinogen, Ur: 0.2 mg/dL (ref 0.2–1.0)
pH, UA: 6.5 (ref 5.0–7.5)

## 2023-05-05 MED ORDER — METHOCARBAMOL 500 MG PO TABS
500.0000 mg | ORAL_TABLET | Freq: Three times a day (TID) | ORAL | 3 refills | Status: AC | PRN
Start: 2023-05-05 — End: ?

## 2023-05-05 NOTE — Patient Instructions (Signed)
Recommendations regarding UTI prevention / management:  When UTI symptoms occur: Call urology office to request order for urine culture. We recommend waiting for urine culture result prior to use of any antibiotics.   For bladder pain/ burning with urination: Over the counter Pyridium (phenazopyridine) as needed (commonly known under the "AZO" brand). No more than 3 days consecutively at a time due to risk for methemoglobinemia, liver function issues, and bone health damage with long term use of Pyridium.  Routine use for UTI prevention: Adequate fluid intake (>1.5 liters/day) to flush out the urinary tract. - Go to the bathroom to urinate every 4-6 hours while awake to minimize urinary stasis / bacterial overgrowth in the bladder. - Proanthocyanidin (PAC) supplement 36 mg daily; must be soluble (insoluble form of PAC will be ineffective). Recommended brand: Ellura. This is an over-the-counter supplement (often must be found/ purchased online) supplement derived from cranberries with concentrated active component: Proanthocyanidin (PAC) 36 mg daily. Decreases bacterial adherence to bladder lining. Not recommended for patients with interstitial cystitis due to acidity. - D-mannose powder (2 grams daily). This is an over-the-counter supplement which decreases bacterial adherence to bladder lining (it is a sugar that inhibits bacterial adherence to urothelial cells by binding to the pili of enteric bacteria). Take as per manufacturer recommendation. Can be used as an alternative or in addition to the concentrated cranberry supplement. Not recommended for diabetic patients due to sugar content. - Vitamin C supplement to acidify urine to minimize bacterial growth. Not recommended for patients with interstitial cystitis due to acidity. - Probiotic to maintain healthy vaginal microbiome to suppress bacteria at urethral opening. Brand recommendations: Darrold Junker (includes probiotic & D-mannose ), Feminine Balance  (highest concentration of lactobacillus) or Hyperbiotic Pro 15.

## 2023-05-05 NOTE — Progress Notes (Signed)
post void residual=0 ?
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
## 2023-05-28 ENCOUNTER — Ambulatory Visit: Payer: Medicaid Other | Admitting: Orthopaedic Surgery

## 2023-05-31 DIAGNOSIS — L501 Idiopathic urticaria: Secondary | ICD-10-CM | POA: Insufficient documentation

## 2023-05-31 DIAGNOSIS — J339 Nasal polyp, unspecified: Secondary | ICD-10-CM | POA: Insufficient documentation

## 2023-05-31 DIAGNOSIS — R0602 Shortness of breath: Secondary | ICD-10-CM | POA: Insufficient documentation

## 2023-06-02 ENCOUNTER — Ambulatory Visit: Payer: Medicaid Other | Admitting: Physician Assistant

## 2023-06-03 DIAGNOSIS — R1310 Dysphagia, unspecified: Secondary | ICD-10-CM | POA: Insufficient documentation

## 2023-06-03 HISTORY — DX: Dysphagia, unspecified: R13.10

## 2023-06-19 ENCOUNTER — Other Ambulatory Visit: Payer: Self-pay

## 2023-06-19 ENCOUNTER — Telehealth: Payer: Self-pay | Admitting: Physician Assistant

## 2023-06-19 DIAGNOSIS — T07XXXA Unspecified multiple injuries, initial encounter: Secondary | ICD-10-CM | POA: Insufficient documentation

## 2023-06-19 DIAGNOSIS — F411 Generalized anxiety disorder: Secondary | ICD-10-CM

## 2023-06-19 HISTORY — DX: Unspecified multiple injuries, initial encounter: T07.XXXA

## 2023-06-19 NOTE — Telephone Encounter (Signed)
Please check PMP for last RF on gabapentin and alprazolam.

## 2023-06-19 NOTE — Telephone Encounter (Signed)
Gabapentin LF 05/30/23; Alprazolam LF 05/20/23

## 2023-06-19 NOTE — Telephone Encounter (Signed)
Patient called in for Gabapentin 600mg  and Alprazolam 1mg . Ph: [phone] Appt 10/3 Pharmacy [facility] [address] [place]

## 2023-06-19 NOTE — Telephone Encounter (Signed)
Pended alprazolam. Gabapentin postponed, due 10/4.

## 2023-06-20 ENCOUNTER — Other Ambulatory Visit: Payer: Self-pay | Admitting: Physician Assistant

## 2023-06-20 DIAGNOSIS — F411 Generalized anxiety disorder: Secondary | ICD-10-CM

## 2023-06-20 MED ORDER — ALPRAZOLAM 1 MG PO TABS
1.0000 mg | ORAL_TABLET | Freq: Two times a day (BID) | ORAL | 0 refills | Status: DC | PRN
Start: 2023-06-20 — End: 2023-06-26

## 2023-06-26 ENCOUNTER — Ambulatory Visit (INDEPENDENT_AMBULATORY_CARE_PROVIDER_SITE_OTHER): Payer: Medicaid Other | Admitting: Physician Assistant

## 2023-06-26 ENCOUNTER — Encounter: Payer: Self-pay | Admitting: Physician Assistant

## 2023-06-26 DIAGNOSIS — R69 Illness, unspecified: Secondary | ICD-10-CM

## 2023-06-26 DIAGNOSIS — Z91410 Personal history of adult physical and sexual abuse: Secondary | ICD-10-CM

## 2023-06-26 DIAGNOSIS — F3341 Major depressive disorder, recurrent, in partial remission: Secondary | ICD-10-CM | POA: Diagnosis not present

## 2023-06-26 DIAGNOSIS — F411 Generalized anxiety disorder: Secondary | ICD-10-CM

## 2023-06-26 DIAGNOSIS — F4310 Post-traumatic stress disorder, unspecified: Secondary | ICD-10-CM

## 2023-06-26 MED ORDER — DULOXETINE HCL 60 MG PO CPEP: 60.0000 mg | ORAL_CAPSULE | Freq: Every day | ORAL | 2 refills | Status: DC

## 2023-06-26 MED ORDER — ALPRAZOLAM 1 MG PO TABS: 1.0000 mg | ORAL_TABLET | Freq: Two times a day (BID) | ORAL | 2 refills | Status: DC | PRN
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
## 2023-06-27 ENCOUNTER — Other Ambulatory Visit: Payer: Self-pay

## 2023-06-27 NOTE — Telephone Encounter (Signed)
Gabapentin was filled 9/28.

## 2023-07-09 NOTE — Telephone Encounter (Signed)
Please see below and advise.

## 2023-07-16 ENCOUNTER — Other Ambulatory Visit: Payer: Medicaid Other | Admitting: Urology

## 2023-07-16 DIAGNOSIS — F32A Depression, unspecified: Secondary | ICD-10-CM | POA: Insufficient documentation

## 2023-07-26 ENCOUNTER — Other Ambulatory Visit: Payer: Self-pay

## 2023-07-26 ENCOUNTER — Emergency Department (HOSPITAL_COMMUNITY)
Admission: EM | Admit: 2023-07-26 | Discharge: 2023-07-27 | Discharge status: Against Medical Advice | Payer: Medicaid Other | Attending: Emergency Medicine | Admitting: Emergency Medicine

## 2023-07-26 ENCOUNTER — Encounter (HOSPITAL_COMMUNITY): Payer: Self-pay

## 2023-07-26 DIAGNOSIS — Z5321 Procedure and treatment not carried out due to patient leaving prior to being seen by health care provider: Secondary | ICD-10-CM | POA: Insufficient documentation

## 2023-07-26 DIAGNOSIS — R1031 Right lower quadrant pain: Secondary | ICD-10-CM | POA: Diagnosis present

## 2023-07-26 LAB — CBC
HCT: 44.1 % (ref 36.0–46.0)
Hemoglobin: 14.7 g/dL (ref 12.0–15.0)
MCH: 30.6 pg (ref 26.0–34.0)
MCHC: 33.3 g/dL (ref 30.0–36.0)
MCV: 91.9 fL (ref 80.0–100.0)
Platelets: 271 10*3/uL (ref 150–400)
RBC: 4.8 MIL/uL (ref 3.87–5.11)
RDW: 12.3 % (ref 11.5–15.5)
WBC: 13.1 10*3/uL — ABNORMAL HIGH (ref 4.0–10.5)
nRBC: 0 % (ref 0.0–0.2)

## 2023-07-26 LAB — COMPREHENSIVE METABOLIC PANEL
ALT: 16 U/L (ref 0–44)
AST: 16 U/L (ref 15–41)
Albumin: 3.7 g/dL (ref 3.5–5.0)
Alkaline Phosphatase: 72 U/L (ref 38–126)
Anion gap: 11 (ref 5–15)
BUN: 15 mg/dL (ref 6–20)
CO2: 21 mmol/L — ABNORMAL LOW (ref 22–32)
Calcium: 9 mg/dL (ref 8.9–10.3)
Chloride: 108 mmol/L (ref 98–111)
Creatinine, Ser: 0.82 mg/dL (ref 0.44–1.00)
GFR, Estimated: >60 mL/min (ref >60)
Glucose, Bld: 124 mg/dL — ABNORMAL HIGH (ref 70–99)
Potassium: 3.9 mmol/L (ref 3.5–5.1)
Sodium: 140 mmol/L (ref 135–145)
Total Bilirubin: 0.4 mg/dL (ref 0.3–1.2)
Total Protein: 6.7 g/dL (ref 6.5–8.1)

## 2023-07-26 LAB — URINALYSIS, ROUTINE W REFLEX MICROSCOPIC
Bilirubin Urine: NEGATIVE
Glucose, UA: NEGATIVE mg/dL
Hgb urine dipstick: NEGATIVE
Ketones, ur: NEGATIVE mg/dL
Leukocytes,Ua: NEGATIVE
Nitrite: NEGATIVE
Protein, ur: NEGATIVE mg/dL
Specific Gravity, Urine: 1.023 (ref 1.005–1.030)
pH: 5 (ref 5.0–8.0)

## 2023-07-26 LAB — LIPASE, BLOOD: Lipase: 29 U/L (ref 11–51)

## 2023-07-26 LAB — HCG, SERUM, QUALITATIVE: Preg, Serum: POSITIVE — AB

## 2023-07-26 NOTE — ED Triage Notes (Addendum)
Patient reports having a cyst and hearing "popping sound" around 6pm. Patient reports pain in the RLQ 10/10. Patient states she was advised to come to the ER for pain control. Patient has been taking zofran 8mg  and hydrocodone at home with no relief. Denies bleeding. Scheduled to see OBGYN in 2 weeks.

## 2023-07-27 NOTE — ED Notes (Signed)
Pt called [facility] to go to a room. Pt could not be found.  

## 2023-07-30 ENCOUNTER — Ambulatory Visit: Payer: Medicaid Other | Admitting: Physical Therapy

## 2023-08-04 ENCOUNTER — Ambulatory Visit: Payer: Medicaid Other | Admitting: Physical Therapy

## 2023-08-09 ENCOUNTER — Other Ambulatory Visit: Payer: Self-pay

## 2023-08-09 ENCOUNTER — Emergency Department (HOSPITAL_COMMUNITY): Payer: Medicaid Other

## 2023-08-09 ENCOUNTER — Inpatient Hospital Stay (HOSPITAL_COMMUNITY)
Admission: EM | Admit: 2023-08-09 | Discharge: 2023-08-11 | DRG: 833 | Disposition: A | Discharge status: Home / Self Care | Payer: Medicaid Other | Attending: Obstetrics and Gynecology | Admitting: Obstetrics and Gynecology

## 2023-08-09 DIAGNOSIS — Z79899 Other long term (current) drug therapy: Secondary | ICD-10-CM

## 2023-08-09 DIAGNOSIS — Z825 Family history of asthma and other chronic lower respiratory diseases: Secondary | ICD-10-CM

## 2023-08-09 DIAGNOSIS — Z23 Encounter for immunization: Secondary | ICD-10-CM

## 2023-08-09 DIAGNOSIS — Z3A01 Less than 8 weeks gestation of pregnancy: Secondary | ICD-10-CM

## 2023-08-09 DIAGNOSIS — O009 Unspecified ectopic pregnancy without intrauterine pregnancy: Principal | ICD-10-CM | POA: Diagnosis present

## 2023-08-09 DIAGNOSIS — Z880 Allergy status to penicillin: Secondary | ICD-10-CM

## 2023-08-09 DIAGNOSIS — R1031 Right lower quadrant pain: Secondary | ICD-10-CM

## 2023-08-09 DIAGNOSIS — F17210 Nicotine dependence, cigarettes, uncomplicated: Secondary | ICD-10-CM | POA: Diagnosis present

## 2023-08-09 DIAGNOSIS — O2691 Pregnancy related conditions, unspecified, first trimester: Secondary | ICD-10-CM | POA: Diagnosis present

## 2023-08-09 DIAGNOSIS — O99331 Smoking (tobacco) complicating pregnancy, first trimester: Secondary | ICD-10-CM | POA: Diagnosis present

## 2023-08-09 LAB — I-STAT CHEM 8, ED
BUN: 16 mg/dL (ref 6–20)
Calcium, Ion: 1.16 mmol/L (ref 1.15–1.40)
Chloride: 101 mmol/L (ref 98–111)
Creatinine, Ser: 0.9 mg/dL (ref 0.44–1.00)
Glucose, Bld: 98 mg/dL (ref 70–99)
HCT: 43 % (ref 36.0–46.0)
Hemoglobin: 14.6 g/dL (ref 12.0–15.0)
Potassium: 3.8 mmol/L (ref 3.5–5.1)
Sodium: 137 mmol/L (ref 135–145)
TCO2: 24 mmol/L (ref 22–32)

## 2023-08-09 LAB — CBC WITH DIFFERENTIAL/PLATELET
Abs Immature Granulocytes: 0.05 10*3/uL (ref 0.00–0.07)
Basophils Absolute: 0.1 10*3/uL (ref 0.0–0.1)
Basophils Relative: 1 %
Eosinophils Absolute: 0.6 10*3/uL — ABNORMAL HIGH (ref 0.0–0.5)
Eosinophils Relative: 5 %
HCT: 43.1 % (ref 36.0–46.0)
Hemoglobin: 14.4 g/dL (ref 12.0–15.0)
Immature Granulocytes: 0 %
Lymphocytes Relative: 22 %
Lymphs Abs: 3.1 10*3/uL (ref 0.7–4.0)
MCH: 30.8 pg (ref 26.0–34.0)
MCHC: 33.4 g/dL (ref 30.0–36.0)
MCV: 92.1 fL (ref 80.0–100.0)
Monocytes Absolute: 0.7 10*3/uL (ref 0.1–1.0)
Monocytes Relative: 5 %
Neutro Abs: 9.1 10*3/uL — ABNORMAL HIGH (ref 1.7–7.7)
Neutrophils Relative %: 67 %
Platelets: 286 10*3/uL (ref 150–400)
RBC: 4.68 MIL/uL (ref 3.87–5.11)
RDW: 12.3 % (ref 11.5–15.5)
WBC: 13.6 10*3/uL — ABNORMAL HIGH (ref 4.0–10.5)
nRBC: 0 % (ref 0.0–0.2)

## 2023-08-09 LAB — COMPREHENSIVE METABOLIC PANEL
ALT: 23 U/L (ref 0–44)
AST: 25 U/L (ref 15–41)
Albumin: 3.4 g/dL — ABNORMAL LOW (ref 3.5–5.0)
Alkaline Phosphatase: 77 U/L (ref 38–126)
Anion gap: 8 (ref 5–15)
BUN: 14 mg/dL (ref 6–20)
CO2: 25 mmol/L (ref 22–32)
Calcium: 8.9 mg/dL (ref 8.9–10.3)
Chloride: 104 mmol/L (ref 98–111)
Creatinine, Ser: 0.81 mg/dL (ref 0.44–1.00)
GFR, Estimated: >60 mL/min (ref >60)
Glucose, Bld: 100 mg/dL — ABNORMAL HIGH (ref 70–99)
Potassium: 3.9 mmol/L (ref 3.5–5.1)
Sodium: 137 mmol/L (ref 135–145)
Total Bilirubin: 0.4 mg/dL (ref <1.2)
Total Protein: 6 g/dL — ABNORMAL LOW (ref 6.5–8.1)

## 2023-08-09 LAB — HCG, QUANTITATIVE, PREGNANCY: hCG, Beta Chain, Quant, S: 3461 m[IU]/mL — ABNORMAL HIGH (ref <5)

## 2023-08-09 MED ORDER — MORPHINE SULFATE (PF) 4 MG/ML IV SOLN
4.0000 mg | Freq: Once | INTRAVENOUS | Status: AC
  Administered 2023-08-09: 4 mg via INTRAVENOUS
  Filled 2023-08-09: qty 1

## 2023-08-09 MED ORDER — ONDANSETRON HCL 4 MG/2ML IJ SOLN
4.0000 mg | Freq: Once | INTRAMUSCULAR | Status: AC
  Administered 2023-08-10: 4 mg via INTRAVENOUS
  Filled 2023-08-09: qty 2

## 2023-08-09 MED ORDER — FENTANYL CITRATE PF 50 MCG/ML IJ SOSY: 50.0000 ug | PREFILLED_SYRINGE | Freq: Once | INTRAMUSCULAR | Status: DC

## 2023-08-09 NOTE — ED Triage Notes (Addendum)
Pt bib [name], pt states she was trying to come to [facility] but called EMS due to severe abdominal pain. EMS states pt has had positive pregnancy tests, both urine and blood tests. Pt states this was earlier this week and states "they said I was about 5-7 weeks but they couldn't see anything on the ultrasound." Pt endorses constant abdominal pain that has started around 3 weeks ago. Pt reports light vaginal bleeding that started around 24 hours ago, both bright and dark blood. Pt states she is not on birth control.     EMS vitals: 148/71 98 HR 100 RA 110 cbg

## 2023-08-09 NOTE — ED Provider Notes (Incomplete)
[place] EMERGENCY DEPARTMENT AT [facility] Provider Note   CSN: [id number] Arrival date & time: 08/09/23  2128     History {Add pertinent medical, surgical, social history, OB history to HPI:1} Chief Complaint  Patient presents with  . Abdominal Pain  . Vaginal Bleeding    [name] is a 37 y.o. female, G2P0101, who presents to the ED secondary to right lower quadrant pain for the last day.  She states that she had an ultrasound done at [facility], this week, and a beta-hCG, there was concern for an ectopic, since she was around 5 to [redacted] weeks pregnant based off her beta-hCG, and she was having a little bit of vaginal bleeding, and there was no fetus visualized on the ultrasound.  She states then she started having heavy vaginal bleeding today, and severe right lower quadrant pain, and feels like her abdomen is now bulging.  She notes that she decided come to the ER because the pain is so severe 10 out of 10.  Has only had 1 birth prior, which was a premature baby, no history of abortions or miscarriages.  Denies any fevers or chills, but states that with her last pregnancy, she had a infection.  Home Medications Prior to Admission medications   Medication Sig Start Date End Date Taking? Authorizing Provider  albuterol (VENTOLIN HFA) 108 (90 Base) MCG/ACT inhaler Inhale 2 puffs into the lungs every 4 (four) hours as needed for shortness of breath. 03/03/22   [provider]  ALPRAZolam Prudy Feeler) 1 MG tablet Take 1 tablet (1 mg total) by mouth 2 (two) times daily as needed. for anxiety 06/26/23   Melony Overly T, PA-C  azelastine (ASTELIN) 0.1 % nasal spray Place 1 spray into both nostrils daily. Use in each nostril as directed    [provider]  diclofenac Sodium (VOLTAREN) 1 % GEL Apply 1 Application topically daily as needed for pain. 03/03/22   [provider]  diphenhydrAMINE (BENADRYL) 25 MG tablet Take 1 tablet (25 mg total) by  mouth every 6 (six) hours as needed for itching. 09/16/22   Sabas Sous, MD  DULoxetine (CYMBALTA) 60 MG capsule Take 1 capsule (60 mg total) by mouth daily. 06/26/23   Melony Overly T, PA-C  EPINEPHrine 0.3 mg/0.3 mL IJ SOAJ injection Inject 0.3 mg into the muscle as needed for anaphylaxis. Patient not taking: Reported on 06/26/2023 12/26/22   Rondel Baton, MD  famotidine (PEPCID) 20 MG tablet Take 1 tablet (20 mg total) by mouth every 12 (twelve) hours as needed (rash). 09/16/22   Sabas Sous, MD  gabapentin (NEURONTIN) 600 MG tablet Take 1 tablet (600 mg total) by mouth at bedtime. Patient taking differently: Take 600 mg by mouth 3 (three) times daily. 06/06/22   Melony Overly T, PA-C  hydrocortisone cream 1 % Apply to affected area 2 times daily 05/01/23   Anders Simmonds T, DO  hydrOXYzine (ATARAX) 10 MG tablet Take 1-2 tablets (10-20 mg total) by mouth every 8 (eight) hours as needed. 11/29/22   Cherie Ouch, PA-C  LINZESS 290 MCG CAPS capsule  10/01/22   [provider]  loratadine (CLARITIN) 10 MG tablet Take 10 mg by mouth daily.    [provider]  LUPRON DEPOT, 46-MONTH, 11.25 MG injection SMARTSIG:11.25 Milligram(s) SUB-Q Every 3 Months Patient not taking: Reported on 06/26/2023    [provider]  methocarbamol (ROBAXIN) 500 MG tablet Take 1 tablet (500 mg total) by  mouth every 8 (eight) hours as needed for muscle spasms. 05/05/23   Donnita Falls, FNP  ondansetron (ZOFRAN) 4 MG tablet Take 4 mg by mouth every 8 (eight) hours as needed for nausea or vomiting.    [provider]  senna (SENOKOT) 8.6 MG tablet Take 2 tablets by mouth daily.    [provider]  SUMAtriptan (IMITREX) 50 MG tablet Take 50 mg by mouth as needed for migraine. 05/26/22   [provider]      Allergies    Penicillins, Shrimp [shellfish allergy], Plecanatide, Amoxicillin-pot clavulanate, Clarithromycin, Egg-derived products, Moxifloxacin, Soy allergy,  and Wheat    Review of Systems   Review of Systems  Gastrointestinal:  Positive for abdominal pain.  Genitourinary:  Positive for vaginal bleeding.    Physical Exam Updated Vital Signs BP 133/83   Pulse 98   Temp 97.7 F (36.5 C) (Oral)   Resp 15   Ht 5\' 4"  (1.626 m)   Wt 79.8 kg   SpO2 100%   BMI 30.21 kg/m  Physical Exam Vitals and nursing note reviewed.  Constitutional:      General: She is not in acute distress.    Appearance: She is well-developed.  HENT:     Head: Normocephalic and atraumatic.  Eyes:     Conjunctiva/sclera: Conjunctivae normal.  Cardiovascular:     Rate and Rhythm: Normal rate and regular rhythm.     Heart sounds: No murmur heard. Pulmonary:     Effort: Pulmonary effort is normal. No respiratory distress.     Breath sounds: Normal breath sounds.  Abdominal:     Palpations: Abdomen is soft.     Tenderness: There is abdominal tenderness in the right lower quadrant. There is no guarding or rebound.  Musculoskeletal:        General: No swelling.     Cervical back: Neck supple.  Skin:    General: Skin is warm and dry.     Capillary Refill: Capillary refill takes less than 2 seconds.  Neurological:     Mental Status: She is alert.  Psychiatric:        Mood and Affect: Mood normal.     ED Results / Procedures / Treatments   Labs (all labs ordered are listed, but only abnormal results are displayed) Labs Reviewed  CBC WITH DIFFERENTIAL/PLATELET - Abnormal; Notable for the following components:      Result Value   WBC 13.6 (*)    Neutro Abs 9.1 (*)    Eosinophils Absolute 0.6 (*)    All other components within normal limits  COMPREHENSIVE METABOLIC PANEL - Abnormal; Notable for the following components:   Glucose, Bld 100 (*)    Total Protein 6.0 (*)    Albumin 3.4 (*)    All other components within normal limits  HCG, QUANTITATIVE, PREGNANCY - Abnormal; Notable for the following components:   hCG, Beta Chain, Quant, S 3,461 (*)    All  other components within normal limits  URINALYSIS, ROUTINE W REFLEX MICROSCOPIC  POC URINE PREG, ED  I-STAT CHEM 8, ED  TYPE AND SCREEN    EKG None  Radiology US OB Transvaginal  Result Date: 08/09/2023 CLINICAL DATA:  Right lower quadrant pain, beta HCG 3,461 EXAM: TRANSVAGINAL OB ULTRASOUND TECHNIQUE: Transvaginal ultrasound was performed for complete evaluation of the gestation as well as the maternal uterus, adnexal regions, and pelvic cul-de-sac. COMPARISON:  None Available. FINDINGS: Intrauterine gestational sac: None Yolk sac:  Not Visualized. Embryo:  Not  Visualized. Maternal uterus/adnexae: The uterus is anteverted measuring 8.5 x 3.4 by 5.4 cm. Endometrium measures 6 mm in thickness. 1.6 x 1.0 x 1.5 cm cystic area within the cervix consistent with nabothian cysts. Right ovary measures 2.7 x 2.1 x 2.9 cm. The left ovary is not identified. No adnexal masses or free fluid. IMPRESSION: 1. No evidence of intrauterine pregnancy at this time. Serial beta HCG measurements and follow-up ultrasound recommended to document IUP and exclude ectopic pregnancy. Electronically Signed   By: Sharlet Salina M.D.   On: 08/09/2023 23:42    Procedures Procedures  {Document cardiac monitor, telemetry assessment procedure when appropriate:1}  Medications Ordered in ED Medications  fentaNYL (SUBLIMAZE) injection 50 mcg (has no administration in time range)  ondansetron (ZOFRAN) injection 4 mg (has no administration in time range)  morphine (PF) 4 MG/ML injection 4 mg (4 mg Intravenous Given 08/09/23 2250)    ED Course/ Medical Decision Making/ A&P   {   Click here for ABCD2, HEART and other calculatorsREFRESH Note before signing :1}                              Medical Decision Making Patient is a 37 year old female, here for right lower quadrant pain, that began today, states she is around 5 to [redacted] weeks pregnant, based off of beta-hCG that she got done about a week ago, and that that when they did  a transvaginal ultrasound there is no evidence of any kind of fetus evidence of uterus.  She states she is concerned for an ectopic, and now she is having vaginal bleeding, and severe right lower quadrant pain.  There is concern for an ectopic, [facility] was contacted by charge, they refused to take the patient till pregnancy test is administered.  I spoke with Dr. Richardson Dopp, concerns for ectopic, she would like [place] to further evaluate, here in the ER instead of send to [facility], and to call once hemoglobin is back, as well as ultrasound.  Additionally I called the ultrasound tech, immediately, to tell her concerns of ectopic, she is coming to get the patient.  Amount and/or Complexity of Data Reviewed Labs: ordered.    Details: Beta hcg of 3400+, hgb 14.4 Radiology: ordered.    Details: Transvaginal ultrasound does not show any kind of fetus in the uterus Discussion of management or test interpretation with external provider(s): Discussed with Dr. Richardson Dopp again immediately after ultrasound results, resulted patient not having any control with the morphine, no improvement, we will give her some fentanyl.  She would like to have her transferred to [facility], for further management, and possible surgery versus discussion on use of methotrexate for possible ectopic pregnancy.  Informed charge nurse, [place], the patient will need to be transferred for [facility] emergently for concern for ectopic pregnancy  Risk Prescription drug management. Decision regarding hospitalization.    Final Clinical Impression(s) / ED Diagnoses Final diagnoses:  Ectopic pregnancy without intrauterine pregnancy, unspecified location  RLQ abdominal pain    Rx / DC Orders ED Discharge Orders     None

## 2023-08-09 NOTE — ED Notes (Signed)
ED TO INPATIENT HANDOFF REPORT  ED Nurse Name and Phone #: Molli Hazard [phone]  MORE NOTES, SCROLL DOWN!!!!  Name/Age/Gender [name] 37 y.o. female Room/Bed: 034C/034C  Code Status   Code Status: Prior  Home/SNF/Other Home Patient oriented to: self, place, time, and situation Is this baseline? Yes   Triage Complete: Triage complete  Chief Complaint Ectopic preg.  Triage Note Pt bib [facility], pt states she was trying to come to [facility] but called EMS due to severe abdominal pain. EMS states pt has had positive pregnancy tests, both urine and blood tests. Pt states this was earlier this week and states "they said I was about 5-7 weeks but they couldn't see anything on the ultrasound." Pt endorses constant abdominal pain that has started around 3 weeks ago. Pt reports light vaginal bleeding that started around 24 hours ago, both bright and dark blood. Pt states she is not on birth control.     EMS vitals: 148/71 98 HR 100 RA 110 cbg    Allergies Allergies  Allergen Reactions   Penicillins Anaphylaxis    ++++ABLE TO TAKE ROCEPHIN++++ Has patient had a PCN reaction causing immediate rash, facial/tongue/throat swelling, SOB or lightheadedness with hypotension: yes Has patient had a PCN reaction causing severe rash involving mucus membranes or skin necrosis: unknown Has patient had a PCN reaction that required hospitalization no Has patient had a PCN reaction occurring within the last 10 years: yes If all of the above answers are "NO", then may proceed with Cephalosporin use.    Shrimp [Shellfish Allergy] Anaphylaxis   Plecanatide Other (See Comments)    Trulance Pitting edema    Amoxicillin-Pot Clavulanate    Clarithromycin Other (See Comments)    Gi -upset   Egg-Derived Products Nausea Only    Yolk   Moxifloxacin Other (See Comments)    Gi- upset   Soy Allergy Nausea Only   Wheat Other (See Comments)    inflammation    Level of Care/Admitting  Diagnosis ED Disposition     ED Disposition  Send to L&D/MAU   Condition  --   Comment  --         B Medical/Surgery History Past Medical History:  Diagnosis Date   ADHD (attention deficit hyperactivity disorder)    Allergy    seasonal   Anxiety    Arthritis    Aspergillosis (HCC)    sinus   Chest pain    none since February 2023   Chronic idiopathic urticaria    Colon disorder    right ascending colon blockage since May of 2021-present   Complication of anesthesia 2015   woke up early, when tube was removal, "I had blood all over my teeth and face, I could not catch my breath"  This was at [facility], Nov 2018 had surgery at [facility].   Dermatographism    Duodenitis    GERD (gastroesophageal reflux disease)    Headache    Herpes    History of kidney stones    Palpitations    history of palpitations right after Covid- none since not working in July 2023   PCOS (polycystic ovarian syndrome)    PID (pelvic inflammatory disease)    Pneumonia    PONV (postoperative nausea and vomiting)    vomiting after tonsillectomy, 2008   Raynaud's disease    Small bowel obstruction [facility])    June 2023- resolved, small intestine collapse- resolved   TMJ (temporomandibular joint disorder)    August  2023   Past Surgical History:  Procedure Laterality Date   APPENDECTOMY     CERVICAL CERCLAGE     ESOPHAGOGASTRODUODENOSCOPY     KNEE SURGERY Right 2007   LAPAROSCOPIC APPENDECTOMY N/A 08/16/2017   Procedure: APPENDECTOMY LAPAROSCOPIC;  Surgeon: Glenna Fellows, MD;  Location: [facility];  Service: General;  Laterality: N/A;   LAPAROSCOPIC LYSIS OF ADHESIONS N/A 10/31/2016   Procedure: LAPAROSCOPIC LYSIS OF ADHESIONS;  Surgeon: Hoover Browns, MD;  Location: [facility]S;  Service: Gynecology;  Laterality: N/A;   LAPAROSCOPIC OVARIAN CYSTECTOMY N/A 05/29/2022   Procedure: LAPAROSCOPIC LEFT OVARIAN CYSTECTOMY WITH LYSIS OF ADHESIONS;  Surgeon: Hoover Browns, MD;  Location: [facility] OR;  Service: Gynecology;   Laterality: N/A;   LAPAROSCOPY N/A 10/31/2016   Procedure: LAPAROSCOPY DIAGNOSTIC;  Surgeon: Hoover Browns, MD;  Location: [facility];  Service: Gynecology;  Laterality: N/A;   NASAL SINUS SURGERY     TONSILLECTOMY  2008   WISDOM TOOTH EXTRACTION       A IV Location/Drains/Wounds Patient Lines/Drains/Airways Status     Active Line/Drains/Airways     Name Placement date Placement time Site Days   Peripheral IV 01/01/23 20 G Left Antecubital 01/01/23  0224  Antecubital  220   Incision - 2 Ports Abdomen Umbilicus Right;Mid 10/31/16  1425  -- 2473   Incision - 3 Ports Abdomen 1: Right;Upper 2: Umbilicus 3: Left;Lower 08/16/17  1442  -- 2184   Incision - 3 Ports Abdomen Umbilicus Left;Lower Right;Lower 05/29/22  2000  -- 437            Intake/Output Last 24 hours No intake or output data in the 24 hours ending 08/09/23 2354  Labs/Imaging Results for orders placed or performed during the hospital encounter of 08/09/23 (from the past 48 hour(s))  CBC with Differential     Status: Abnormal   Collection Time: 08/09/23  9:52 PM  Result Value Ref Range   WBC 13.6 (H) 4.0 - 10.5 K/uL   RBC 4.68 3.87 - 5.11 MIL/uL   Hemoglobin 14.4 12.0 - 15.0 g/dL   HCT 56.2 13.0 - 86.5 %   MCV 92.1 80.0 - 100.0 fL   MCH 30.8 26.0 - 34.0 pg   MCHC 33.4 30.0 - 36.0 g/dL   RDW 78.4 69.6 - 29.5 %   Platelets 286 150 - 400 K/uL   nRBC 0.0 0.0 - 0.2 %   Neutrophils Relative % 67 %   Neutro Abs 9.1 (H) 1.7 - 7.7 K/uL   Lymphocytes Relative 22 %   Lymphs Abs 3.1 0.7 - 4.0 K/uL   Monocytes Relative 5 %   Monocytes Absolute 0.7 0.1 - 1.0 K/uL   Eosinophils Relative 5 %   Eosinophils Absolute 0.6 (H) 0.0 - 0.5 K/uL   Basophils Relative 1 %   Basophils Absolute 0.1 0.0 - 0.1 K/uL   Immature Granulocytes 0 %   Abs Immature Granulocytes 0.05 0.00 - 0.07 K/uL    Comment: Performed at [facility], [address] [place], [place] [address]  Comprehensive metabolic panel     Status: Abnormal   Collection  Time: 08/09/23  9:52 PM  Result Value Ref Range   Sodium 137 135 - 145 mmol/L   Potassium 3.9 3.5 - 5.1 mmol/L   Chloride 104 98 - 111 mmol/L   CO2 25 22 - 32 mmol/L   Glucose, Bld 100 (H) 70 - 99 mg/dL    Comment: Glucose reference range applies only to samples taken after fasting  for at least 8 hours.   BUN 14 6 - 20 mg/dL   Creatinine, Ser 1.61 0.44 - 1.00 mg/dL   Calcium 8.9 8.9 - 09.6 mg/dL   Total Protein 6.0 (L) 6.5 - 8.1 g/dL   Albumin 3.4 (L) 3.5 - 5.0 g/dL   AST 25 15 - 41 U/L   ALT 23 0 - 44 U/L   Alkaline Phosphatase 77 38 - 126 U/L   Total Bilirubin 0.4 <1.2 mg/dL   GFR, Estimated >04 >54 mL/min    Comment: (NOTE) Calculated using the CKD-EPI Creatinine Equation (2021)    Anion gap 8 5 - 15    Comment: Performed at [facility], [address], [place], [place] [address]  hCG, quantitative, pregnancy     Status: Abnormal   Collection Time: 08/09/23  9:52 PM  Result Value Ref Range   hCG, Beta Chain, Quant, S 3,461 (H) <5 mIU/mL    Comment:          GEST. AGE      CONC.  (mIU/mL)   <=1 WEEK        5 - 50     2 WEEKS       50 - 500     3 WEEKS       100 - 10,000     4 WEEKS     1,000 - 30,000     5 WEEKS     3,500 - 115,000   6-8 WEEKS     12,000 - 270,000    12 WEEKS     15,000 - 220,000        FEMALE AND NON-PREGNANT FEMALE:     LESS THAN 5 mIU/mL Performed at [facility], [address], [place], [place] [address]   I-stat chem 8, ED (not at [facility], [facility] or [facility])     Status: None   Collection Time: 08/09/23 10:17 PM  Result Value Ref Range   Sodium 137 135 - 145 mmol/L   Potassium 3.8 3.5 - 5.1 mmol/L   Chloride 101 98 - 111 mmol/L   BUN 16 6 - 20 mg/dL   Creatinine, Ser 2.95 0.44 - 1.00 mg/dL   Glucose, Bld 98 70 - 99 mg/dL    Comment: Glucose reference range applies only to samples taken after fasting for at least 8 hours.   Calcium, Ion 1.16 1.15 - 1.40 mmol/L   TCO2 24 22 - 32 mmol/L   Hemoglobin 14.6 12.0 - 15.0 g/dL   HCT 62.1 30.8 -  65.7 %   US OB Transvaginal  Result Date: 08/09/2023 CLINICAL DATA:  Right lower quadrant pain, beta HCG 3,461 EXAM: TRANSVAGINAL OB ULTRASOUND TECHNIQUE: Transvaginal ultrasound was performed for complete evaluation of the gestation as well as the maternal uterus, adnexal regions, and pelvic cul-de-sac. COMPARISON:  None Available. FINDINGS: Intrauterine gestational sac: None Yolk sac:  Not Visualized. Embryo:  Not Visualized. Maternal uterus/adnexae: The uterus is anteverted measuring 8.5 x 3.4 by 5.4 cm. Endometrium measures 6 mm in thickness. 1.6 x 1.0 x 1.5 cm cystic area within the cervix consistent with nabothian cysts. Right ovary measures 2.7 x 2.1 x 2.9 cm. The left ovary is not identified. No adnexal masses or free fluid. IMPRESSION: 1. No evidence of intrauterine pregnancy at this time. Serial beta HCG measurements and follow-up ultrasound recommended to document IUP and exclude ectopic pregnancy. Electronically Signed   By: Sharlet Salina M.D.   On: 08/09/2023 23:42  Pending Labs Unresulted Labs (From admission, onward)     Start     Ordered   08/09/23 2225  Urinalysis, Routine w reflex microscopic -Urine, Clean Catch  Once,   URGENT       Question:  Specimen Source  Answer:  Urine, Clean Catch   08/09/23 2224   08/09/23 2204  Type and screen [facility]  Once,   STAT       Comments: [facility]    08/09/23 2204            Vitals/Pain Today's Vitals   08/09/23 2134 08/09/23 2346  BP: 133/83   Pulse: 98   Resp: 15   Temp: 97.7 F (36.5 C)   TempSrc: Oral   SpO2: 100%   Weight: 176 lb (79.8 kg)   Height: 5\' 4"  (1.626 m)   PainSc: 10-Worst pain ever 2     Isolation Precautions No active isolations  Medications Medications  morphine (PF) 4 MG/ML injection 4 mg (4 mg Intravenous Given 08/09/23 2250)    Mobility walks     Focused Assessments Cardiac Assessment Handoff:    No results found for: "CKTOTAL", "CKMB",  "CKMBINDEX", "TROPONINI" No results found for: "DDIMER" Does the Patient currently have chest pain? No   , Neuro Assessment Handoff:  Swallow screen pass? Yes          Neuro Assessment:   Neuro Checks:      Has TPA been given?  If patient is a Neuro Trauma and patient is going to OR before floor call report to 4N Charge nurse: [phone] or [phone]  , Renal Assessment Handoff: Hemodialysis Schedule:  Last Hemodialysis date and time:    Restricted appendage:   , Pulmonary Assessment Handoff:  Lung sounds:          R Recommendations: See Admitting Provider Note  Report given to:   Additional Notes:   Pt is pleasant and kind. Report mild abdominal pain at present, but reports she has recently had severe abdominal pain with difficulty ambulating due to pain. Pt reports she is pregnant, but is concern about ectopic pregnancy. Pt was able to ambulate without assistance and vitals are stable. Med admin are as noted.

## 2023-08-09 NOTE — ED Provider Notes (Signed)
[place] EMERGENCY DEPARTMENT AT [facility] Provider Note   CSN: [id number] Arrival date & time: 08/09/23  2128     History {Add pertinent medical, surgical, social history, OB history to HPI:1} Chief Complaint  Patient presents with   Abdominal Pain   Vaginal Bleeding    [name] is a 37 y.o. female, G2P0101, who presents to the ED secondary to right lower quadrant pain for the last day.  She states that she had an ultrasound done at [facility], this week, and a beta-hCG, there was concern for an ectopic, since she was around 5 to [redacted] weeks pregnant based off her beta-hCG, and she was having a little bit of vaginal bleeding, and there was no fetus visualized on the ultrasound.  She states then she started having heavy vaginal bleeding today, and severe right lower quadrant pain, and feels like her abdomen is now bulging.  She notes that she decided come to the ER because the pain is so severe 10 out of 10.  Has only had 1 birth prior, which was a premature baby, no history of abortions or miscarriages.  Denies any fevers or chills, but states that with her last pregnancy, she had a infection.  Home Medications Prior to Admission medications   Medication Sig Start Date End Date Taking? Authorizing Provider  albuterol (VENTOLIN HFA) 108 (90 Base) MCG/ACT inhaler Inhale 2 puffs into the lungs every 4 (four) hours as needed for shortness of breath. 03/03/22   [provider]  ALPRAZolam Prudy Feeler) 1 MG tablet Take 1 tablet (1 mg total) by mouth 2 (two) times daily as needed. for anxiety 06/26/23   Melony Overly T, PA-C  azelastine (ASTELIN) 0.1 % nasal spray Place 1 spray into both nostrils daily. Use in each nostril as directed    [provider]  diclofenac Sodium (VOLTAREN) 1 % GEL Apply 1 Application topically daily as needed for pain. 03/03/22   [provider]  diphenhydrAMINE (BENADRYL) 25 MG tablet Take 1 tablet (25 mg total) by  mouth every 6 (six) hours as needed for itching. 09/16/22   Sabas Sous, MD  DULoxetine (CYMBALTA) 60 MG capsule Take 1 capsule (60 mg total) by mouth daily. 06/26/23   Melony Overly T, PA-C  EPINEPHrine 0.3 mg/0.3 mL IJ SOAJ injection Inject 0.3 mg into the muscle as needed for anaphylaxis. Patient not taking: Reported on 06/26/2023 12/26/22   Rondel Baton, MD  famotidine (PEPCID) 20 MG tablet Take 1 tablet (20 mg total) by mouth every 12 (twelve) hours as needed (rash). 09/16/22   Sabas Sous, MD  gabapentin (NEURONTIN) 600 MG tablet Take 1 tablet (600 mg total) by mouth at bedtime. Patient taking differently: Take 600 mg by mouth 3 (three) times daily. 06/06/22   Melony Overly T, PA-C  hydrocortisone cream 1 % Apply to affected area 2 times daily 05/01/23   Anders Simmonds T, DO  hydrOXYzine (ATARAX) 10 MG tablet Take 1-2 tablets (10-20 mg total) by mouth every 8 (eight) hours as needed. 11/29/22   Cherie Ouch, PA-C  LINZESS 290 MCG CAPS capsule  10/01/22   [provider]  loratadine (CLARITIN) 10 MG tablet Take 10 mg by mouth daily.    [provider]  LUPRON DEPOT, 57-MONTH, 11.25 MG injection SMARTSIG:11.25 Milligram(s) SUB-Q Every 3 Months Patient not taking: Reported on 06/26/2023    [provider]  methocarbamol (ROBAXIN) 500 MG tablet Take 1 tablet (500 mg total) by  mouth every 8 (eight) hours as needed for muscle spasms. 05/05/23   Donnita Falls, FNP  ondansetron (ZOFRAN) 4 MG tablet Take 4 mg by mouth every 8 (eight) hours as needed for nausea or vomiting.    [provider]  senna (SENOKOT) 8.6 MG tablet Take 2 tablets by mouth daily.    [provider]  SUMAtriptan (IMITREX) 50 MG tablet Take 50 mg by mouth as needed for migraine. 05/26/22   [provider]      Allergies    Penicillins, Shrimp [shellfish allergy], Plecanatide, Amoxicillin-pot clavulanate, Clarithromycin, Egg-derived products, Moxifloxacin, Soy allergy,  and Wheat    Review of Systems   Review of Systems  Gastrointestinal:  Positive for abdominal pain.  Genitourinary:  Positive for vaginal bleeding.    Physical Exam Updated Vital Signs BP 133/83   Pulse 98   Temp 97.7 F (36.5 C) (Oral)   Resp 15   Ht 5\' 4"  (1.626 m)   Wt 79.8 kg   SpO2 100%   BMI 30.21 kg/m  Physical Exam Vitals and nursing note reviewed.  Constitutional:      General: She is not in acute distress.    Appearance: She is well-developed.  HENT:     Head: Normocephalic and atraumatic.  Eyes:     Conjunctiva/sclera: Conjunctivae normal.  Cardiovascular:     Rate and Rhythm: Normal rate and regular rhythm.     Heart sounds: No murmur heard. Pulmonary:     Effort: Pulmonary effort is normal. No respiratory distress.     Breath sounds: Normal breath sounds.  Abdominal:     Palpations: Abdomen is soft.     Tenderness: There is abdominal tenderness in the right lower quadrant. There is no guarding or rebound.  Musculoskeletal:        General: No swelling.     Cervical back: Neck supple.  Skin:    General: Skin is warm and dry.     Capillary Refill: Capillary refill takes less than 2 seconds.  Neurological:     Mental Status: She is alert.  Psychiatric:        Mood and Affect: Mood normal.     ED Results / Procedures / Treatments   Labs (all labs ordered are listed, but only abnormal results are displayed) Labs Reviewed  CBC WITH DIFFERENTIAL/PLATELET - Abnormal; Notable for the following components:      Result Value   WBC 13.6 (*)    Neutro Abs 9.1 (*)    Eosinophils Absolute 0.6 (*)    All other components within normal limits  COMPREHENSIVE METABOLIC PANEL - Abnormal; Notable for the following components:   Glucose, Bld 100 (*)    Total Protein 6.0 (*)    Albumin 3.4 (*)    All other components within normal limits  HCG, QUANTITATIVE, PREGNANCY - Abnormal; Notable for the following components:   hCG, Beta Chain, Quant, S 3,461 (*)    All  other components within normal limits  URINALYSIS, ROUTINE W REFLEX MICROSCOPIC  POC URINE PREG, ED  I-STAT CHEM 8, ED  TYPE AND SCREEN  RH IG WORKUP (INCLUDES ABO/RH)    EKG None  Radiology No results found.  Procedures Procedures  {Document cardiac monitor, telemetry assessment procedure when appropriate:1}  Medications Ordered in ED Medications  morphine (PF) 4 MG/ML injection 4 mg (4 mg Intravenous Given 08/09/23 2250)    ED Course/ Medical Decision Making/ A&P   {   Click here for ABCD2, HEART  and other calculatorsREFRESH Note before signing :1}                              Medical Decision Making Patient is a 37 year old female, here for right lower quadrant pain, that began today, states she is around 5 to [redacted] weeks pregnant, based off of beta-hCG that she got done about a week ago, and that that when they did a transvaginal ultrasound there is no evidence of any kind of fetus evidence of uterus.  She states she is concerned for an ectopic, and now she is having vaginal bleeding, and severe right lower quadrant pain.  There is concern for an ectopic, [facility] was contacted by charge, they refused to take the patient till pregnancy test is administered.  I spoke with Dr. Richardson Dopp, concerns for ectopic, she would like [place] to further evaluate, here in the ER instead of send to [facility], and to call once hemoglobin is back, as well as ultrasound.  Additionally I called the ultrasound tech, immediately, to tell her concerns of ectopic, she is coming to get the patient.  Amount and/or Complexity of Data Reviewed Labs: ordered.    Details: Beta hcg of 3400+, hgb 14.4 Radiology: ordered.  Risk Prescription drug management.   ***  {Document critical care time when appropriate:1} {Document review of labs and clinical decision tools ie heart score, Chads2Vasc2 etc:1}  {Document your independent review of radiology images, and any outside records:1} {Document your discussion with family  members, caretakers, and with consultants:1} {Document social determinants of health affecting pt's care:1} {Document your decision making why or why not admission, treatments were needed:1} Final Clinical Impression(s) / ED Diagnoses Final diagnoses:  None    Rx / DC Orders ED Discharge Orders     None

## 2023-08-10 ENCOUNTER — Encounter (HOSPITAL_COMMUNITY): Payer: Self-pay | Admitting: Obstetrics and Gynecology

## 2023-08-10 DIAGNOSIS — O99331 Smoking (tobacco) complicating pregnancy, first trimester: Secondary | ICD-10-CM | POA: Diagnosis present

## 2023-08-10 DIAGNOSIS — Z79899 Other long term (current) drug therapy: Secondary | ICD-10-CM | POA: Diagnosis not present

## 2023-08-10 DIAGNOSIS — O009 Unspecified ectopic pregnancy without intrauterine pregnancy: Secondary | ICD-10-CM | POA: Diagnosis present

## 2023-08-10 DIAGNOSIS — Z3A01 Less than 8 weeks gestation of pregnancy: Secondary | ICD-10-CM | POA: Diagnosis not present

## 2023-08-10 DIAGNOSIS — F17210 Nicotine dependence, cigarettes, uncomplicated: Secondary | ICD-10-CM | POA: Diagnosis present

## 2023-08-10 DIAGNOSIS — Z23 Encounter for immunization: Secondary | ICD-10-CM | POA: Diagnosis not present

## 2023-08-10 DIAGNOSIS — Z880 Allergy status to penicillin: Secondary | ICD-10-CM | POA: Diagnosis not present

## 2023-08-10 DIAGNOSIS — R1031 Right lower quadrant pain: Secondary | ICD-10-CM | POA: Diagnosis present

## 2023-08-10 DIAGNOSIS — Z825 Family history of asthma and other chronic lower respiratory diseases: Secondary | ICD-10-CM | POA: Diagnosis not present

## 2023-08-10 LAB — TYPE AND SCREEN
ABO/RH(D): B POS
Antibody Screen: NEGATIVE

## 2023-08-10 MED ORDER — HYDROMORPHONE HCL 1 MG/ML IJ SOLN: 0.2000 mg | INTRAMUSCULAR | Status: DC | PRN

## 2023-08-10 MED ORDER — ONDANSETRON HCL 4 MG PO TABS
8.0000 mg | ORAL_TABLET | Freq: Three times a day (TID) | ORAL | Status: DC | PRN
Start: 2023-08-10 — End: 2023-08-11
  Administered 2023-08-10 – 2023-08-11 (×2): 8 mg via ORAL
  Filled 2023-08-10 (×2): qty 2

## 2023-08-10 MED ORDER — ONDANSETRON HCL 4 MG/2ML IJ SOLN
4.0000 mg | Freq: Four times a day (QID) | INTRAMUSCULAR | Status: DC | PRN
Start: 2023-08-10 — End: 2023-08-10

## 2023-08-10 MED ORDER — METHOTREXATE FOR ECTOPIC PREGNANCY
50.0000 mg/m2 | Freq: Once | INTRAMUSCULAR | Status: AC
  Administered 2023-08-10: 95 mg via INTRAMUSCULAR
  Filled 2023-08-10: qty 3.8

## 2023-08-10 MED ORDER — ALUM & MAG HYDROXIDE-SIMETH 200-200-20 MG/5ML PO SUSP
30.0000 mL | ORAL | Status: DC | PRN
  Administered 2023-08-10: 30 mL via ORAL
  Filled 2023-08-10: qty 30

## 2023-08-10 MED ORDER — SODIUM CHLORIDE 0.9% FLUSH
3.0000 mL | Freq: Two times a day (BID) | INTRAVENOUS | Status: DC
  Administered 2023-08-10 – 2023-08-11 (×4): 3 mL via INTRAVENOUS

## 2023-08-10 MED ORDER — HYDROMORPHONE HCL 1 MG/ML IJ SOLN
0.2000 mg | INTRAMUSCULAR | Status: DC | PRN
  Administered 2023-08-10 (×2): 0.6 mg via INTRAVENOUS
  Filled 2023-08-10 (×2): qty 1

## 2023-08-10 MED ORDER — SODIUM CHLORIDE 0.9 % IV SOLN
250.0000 mL | INTRAVENOUS | Status: AC | PRN
Start: 2023-08-10 — End: 2023-08-11
  Administered 2023-08-10: 250 mL via INTRAVENOUS

## 2023-08-10 MED ORDER — GUAIFENESIN 100 MG/5ML PO LIQD
15.0000 mL | ORAL | Status: DC | PRN
  Administered 2023-08-10 – 2023-08-11 (×3): 15 mL via ORAL
  Filled 2023-08-10 (×3): qty 15

## 2023-08-10 MED ORDER — INFLUENZA VIRUS VACC SPLIT PF (FLUZONE) 0.5 ML IM SUSY
0.5000 mL | PREFILLED_SYRINGE | INTRAMUSCULAR | Status: AC
  Administered 2023-08-11: 0.5 mL via INTRAMUSCULAR
  Filled 2023-08-10: qty 0.5

## 2023-08-10 MED ORDER — HYDROXYZINE HCL 50 MG PO TABS
100.0000 mg | ORAL_TABLET | Freq: Two times a day (BID) | ORAL | Status: DC
  Administered 2023-08-10 – 2023-08-11 (×3): 100 mg via ORAL
  Filled 2023-08-10 (×3): qty 2

## 2023-08-10 MED ORDER — SENNA 8.6 MG PO TABS
1.0000 | ORAL_TABLET | Freq: Two times a day (BID) | ORAL | Status: DC
  Administered 2023-08-11: 8.6 mg via ORAL
  Filled 2023-08-10: qty 1

## 2023-08-10 MED ORDER — FENTANYL CITRATE (PF) 100 MCG/2ML IJ SOLN: 50.0000 ug | Freq: Once | INTRAMUSCULAR | Status: DC

## 2023-08-10 MED ORDER — ONDANSETRON HCL 40 MG/20ML IJ SOLN
8.0000 mg | Freq: Three times a day (TID) | INTRAMUSCULAR | Status: DC | PRN
  Administered 2023-08-10: 8 mg via INTRAVENOUS
  Filled 2023-08-10: qty 4

## 2023-08-10 MED ORDER — ONDANSETRON HCL 4 MG PO TABS
4.0000 mg | ORAL_TABLET | Freq: Four times a day (QID) | ORAL | Status: DC | PRN
Start: 2023-08-10 — End: 2023-08-10

## 2023-08-10 MED ORDER — OXYCODONE-ACETAMINOPHEN 5-325 MG PO TABS
1.0000 | ORAL_TABLET | ORAL | Status: DC | PRN
  Administered 2023-08-10 – 2023-08-11 (×8): 2 via ORAL
  Filled 2023-08-10 (×8): qty 2

## 2023-08-10 MED ORDER — SODIUM CHLORIDE 0.9% FLUSH: 3.0000 mL | INTRAVENOUS | Status: DC | PRN

## 2023-08-10 MED ORDER — MENTHOL 3 MG MT LOZG: 1.0000 | LOZENGE | OROMUCOSAL | Status: DC | PRN

## 2023-08-10 MED ORDER — PROCHLORPERAZINE EDISYLATE 10 MG/2ML IJ SOLN
10.0000 mg | Freq: Once | INTRAMUSCULAR | Status: AC
  Administered 2023-08-10: 10 mg via INTRAVENOUS
  Filled 2023-08-10: qty 2

## 2023-08-10 MED ORDER — IBUPROFEN 800 MG PO TABS
800.0000 mg | ORAL_TABLET | Freq: Three times a day (TID) | ORAL | Status: DC | PRN
  Administered 2023-08-10 – 2023-08-11 (×4): 800 mg via ORAL
  Filled 2023-08-10 (×4): qty 1

## 2023-08-10 MED ORDER — SIMETHICONE 80 MG PO CHEW: 80.0000 mg | CHEWABLE_TABLET | Freq: Four times a day (QID) | ORAL | Status: DC | PRN

## 2023-08-10 NOTE — Progress Notes (Signed)
GYN COMPREHENSIVE PROGRESS NOTE  [name] is a 37 y.o. G2P0101 who is admitted overnight for abdominal pain 2/2 ectopic pregnancy. She receives care by Dr. Sallye Ober at [facility].   Length of Stay:  0 Days. Admitted 08/09/2023  Subjective: Sitting up in bed eating lunch. States she continue to have lower abdominal discomfort but pain medication is helping. She states she is very tired. She also endorses increased bleeding.   Vitals:  Blood pressure (!) 106/53, pulse 82, temperature 97.6 F (36.4 C), resp. rate 17, height 5\' 4"  (1.626 m), weight 79.8 kg, SpO2 98%. Physical Examination: General: Appears well, no acute distress. Age appropriate. Appears sleepy Cardiac: normal heart rate noted Respiratory: normal effort Abdomen: soft, mildly tender diffuse lower abdomen, nondistended GU: No vaginal bleeding visualized, pad in underwear is clean.  Extremities: No edema or cyanosis. Skin: Warm and dry, no rashes noted Neuro: alert and oriented, no focal deficits Psych: normal affect  Results for orders placed or performed during the hospital encounter of 08/09/23 (from the past 48 hour(s))  CBC with Differential     Status: Abnormal   Collection Time: 08/09/23  9:52 PM  Result Value Ref Range   WBC 13.6 (H) 4.0 - 10.5 K/uL   RBC 4.68 3.87 - 5.11 MIL/uL   Hemoglobin 14.4 12.0 - 15.0 g/dL   HCT 32.4 40.1 - 02.7 %   MCV 92.1 80.0 - 100.0 fL   MCH 30.8 26.0 - 34.0 pg   MCHC 33.4 30.0 - 36.0 g/dL   RDW 25.3 66.4 - 40.3 %   Platelets 286 150 - 400 K/uL   nRBC 0.0 0.0 - 0.2 %   Neutrophils Relative % 67 %   Neutro Abs 9.1 (H) 1.7 - 7.7 K/uL   Lymphocytes Relative 22 %   Lymphs Abs 3.1 0.7 - 4.0 K/uL   Monocytes Relative 5 %   Monocytes Absolute 0.7 0.1 - 1.0 K/uL   Eosinophils Relative 5 %   Eosinophils Absolute 0.6 (H) 0.0 - 0.5 K/uL   Basophils Relative 1 %   Basophils Absolute 0.1 0.0 - 0.1 K/uL   Immature Granulocytes 0 %   Abs Immature Granulocytes 0.05 0.00 - 0.07 K/uL     Comment: Performed at [facility], [address], [place], [place] [address]  Comprehensive metabolic panel     Status: Abnormal   Collection Time: 08/09/23  9:52 PM  Result Value Ref Range   Sodium 137 135 - 145 mmol/L   Potassium 3.9 3.5 - 5.1 mmol/L   Chloride 104 98 - 111 mmol/L   CO2 25 22 - 32 mmol/L   Glucose, Bld 100 (H) 70 - 99 mg/dL    Comment: Glucose reference range applies only to samples taken after fasting for at least 8 hours.   BUN 14 6 - 20 mg/dL   Creatinine, Ser 9.56 0.44 - 1.00 mg/dL   Calcium 8.9 8.9 - 38.7 mg/dL   Total Protein 6.0 (L) 6.5 - 8.1 g/dL   Albumin 3.4 (L) 3.5 - 5.0 g/dL   AST 25 15 - 41 U/L   ALT 23 0 - 44 U/L   Alkaline Phosphatase 77 38 - 126 U/L   Total Bilirubin 0.4 <1.2 mg/dL   GFR, Estimated >56 >43 mL/min    Comment: (NOTE) Calculated using the CKD-EPI Creatinine Equation (2021)    Anion gap 8 5 - 15    Comment: Performed at [facility], [address] [place], [place]  [address]  hCG, quantitative, pregnancy     Status: Abnormal   Collection Time: 08/09/23  9:52 PM  Result Value Ref Range   hCG, Beta Chain, Quant, S 3,461 (H) <5 mIU/mL    Comment:          GEST. AGE      CONC.  (mIU/mL)   <=1 WEEK        5 - 50     2 WEEKS       50 - 500     3 WEEKS       100 - 10,000     4 WEEKS     1,000 - 30,000     5 WEEKS     3,500 - 115,000   6-8 WEEKS     12,000 - 270,000    12 WEEKS     15,000 - 220,000        FEMALE AND NON-PREGNANT FEMALE:     LESS THAN 5 mIU/mL Performed at [facility], [address] [place], [place] [address]   I-stat chem 8, ED (not at [facility], [facility] or [facility])     Status: None   Collection Time: 08/09/23 10:17 PM  Result Value Ref Range   Sodium 137 135 - 145 mmol/L   Potassium 3.8 3.5 - 5.1 mmol/L   Chloride 101 98 - 111 mmol/L   BUN 16 6 - 20 mg/dL   Creatinine, Ser 1.30 0.44 - 1.00 mg/dL   Glucose, Bld 98 70 - 99 mg/dL    Comment: Glucose reference range applies only to samples taken after  fasting for at least 8 hours.   Calcium, Ion 1.16 1.15 - 1.40 mmol/L   TCO2 24 22 - 32 mmol/L   Hemoglobin 14.6 12.0 - 15.0 g/dL   HCT 86.5 78.4 - 69.6 %  Type and screen [facility]     Status: None   Collection Time: 08/10/23 12:00 AM  Result Value Ref Range   ABO/RH(D) B POS    Antibody Screen NEG    Sample Expiration      08/13/2023,2359 Performed at [facility], [address], [place], [place] [address]     US OB Transvaginal  Result Date: 08/09/2023 CLINICAL DATA:  Right lower quadrant pain, beta HCG 3,461 EXAM: TRANSVAGINAL OB ULTRASOUND TECHNIQUE: Transvaginal ultrasound was performed for complete evaluation of the gestation as well as the maternal uterus, adnexal regions, and pelvic cul-de-sac. COMPARISON:  None Available. FINDINGS: Intrauterine gestational sac: None Yolk sac:  Not Visualized. Embryo:  Not Visualized. Maternal uterus/adnexae: The uterus is anteverted measuring 8.5 x 3.4 by 5.4 cm. Endometrium measures 6 mm in thickness. 1.6 x 1.0 x 1.5 cm cystic area within the cervix consistent with nabothian cysts. Right ovary measures 2.7 x 2.1 x 2.9 cm. The left ovary is not identified. No adnexal masses or free fluid. IMPRESSION: 1. No evidence of intrauterine pregnancy at this time. Serial beta HCG measurements and follow-up ultrasound recommended to document IUP and exclude ectopic pregnancy. Electronically Signed   By: Sharlet Salina M.D.   On: 08/09/2023 23:42    Current scheduled medications  fentaNYL (SUBLIMAZE) injection  50 mcg Intravenous Once   hydrOXYzine  100 mg Oral BID   [START ON 08/11/2023] influenza vac split trivalent PF  0.5 mL Intramuscular Tomorrow-1000   senna  1 tablet Oral BID   sodium chloride flush  3 mL Intravenous Q12H    I have reviewed the patient's  current medications.  ASSESSMENT/PLAN: Ectopic pregnancy on unknown location. Of note, she endorses increased vaginal bleeding but her pad is clean on exam.  She desires  to try methotrexate given history of pelvic adhesive disease.  Pain well controlled. Per RN will state she is in a 10/10 pain when RN wakes up the patient. With me she appeared relax and in no acute distress. S/p IV morphine x1 and dilaudid x2. De-escalate to percocet, ibuprofen. We discussed expectation of pain management.  Repeat quantative HCG day 4 and 7 Dr. Sallye Ober to assume care on 08/11/2023 at 7 am.    Lavonda Jumbo, DO Family Medicine & Obstetrics [facility] 08/10/2023, 1:16 PM

## 2023-08-10 NOTE — MAU Note (Signed)
..  [name] is a 38 y.o. at Unknown here in MAU from [facility] reporting: lower abdominal pain that began 2-3 weeks ago but it got more intense today. Reports light vaginal bleeding. Only half a pad every a few hours.   Upon arrival to unit Dr. Richardson Dopp met patient in room and entered orders.   Pain score: 10/10

## 2023-08-10 NOTE — H&P (Signed)
[name] is an 37 y.o. female.female, [id number], who presents to the ED secondary to right lower quadrant pain for the 7 days. She is a patient of Dr. Hoover Browns with [facility]. She states that she had an ultrasound done at [facility], this week, and a beta-hCG, there was concern for an ectopic, since she was around 5 to [redacted] weeks pregnant based off her beta-hCG, and she was having a little bit of vaginal bleeding, and there was no fetus visualized on the ultrasound.  She states then she started having heavy vaginal bleeding today, and severe right lower quadrant pain, and feels like her abdomen is now bulging.  She notes that she decided come to the ER because the pain is so severe 10 out of 10.  Has only had 1 birth prior, which was a premature baby, no history of abortions or miscarriages.  Denies any fevers or chills, but states that with her last pregnancy, she had a infection.   She reports chronic pelvic pain over the last year and a history of pelvic adhesive disease/ endometriosis and fibromyagia.      Past Medical History:  Diagnosis Date   ADHD (attention deficit hyperactivity disorder)    Allergy    seasonal   Anxiety    Arthritis    Aspergillosis (HCC)    sinus   Chest pain    none since February 2023   Chronic idiopathic urticaria    Colon disorder    right ascending colon blockage since May of 2021-present   Complication of anesthesia 2015   woke up early, when tube was removal, "I had blood all over my teeth and face, I could not catch my breath"  This was at [facility], Nov 2018 had surgery at [facility]   Dermatographism    Duodenitis    GERD (gastroesophageal reflux disease)    Headache    Herpes    History of kidney stones    Palpitations    history of palpitations right after Covid- none since not working in July 2023   PCOS (polycystic ovarian syndrome)    PID (pelvic inflammatory disease)    Pneumonia    PONV (postoperative nausea and  vomiting)    vomiting after tonsillectomy, 2008   Raynaud's disease    Small bowel obstruction [facility])    June 2023- resolved, small intestine collapse- resolved   TMJ (temporomandibular joint disorder)    August 2023    Past Surgical History:  Procedure Laterality Date   APPENDECTOMY     CERVICAL CERCLAGE     ESOPHAGOGASTRODUODENOSCOPY     KNEE SURGERY Right 2007   LAPAROSCOPIC APPENDECTOMY N/A 08/16/2017   Procedure: APPENDECTOMY LAPAROSCOPIC;  Surgeon: Glenna Fellows, MD;  Location: [facility] ORS;  Service: General;  Laterality: N/A;   LAPAROSCOPIC LYSIS OF ADHESIONS N/A 10/31/2016   Procedure: LAPAROSCOPIC LYSIS OF ADHESIONS;  Surgeon: Hoover Browns, MD;  Location: [facility] ORS;  Service: Gynecology;  Laterality: N/A;   LAPAROSCOPIC OVARIAN CYSTECTOMY N/A 05/29/2022   Procedure: LAPAROSCOPIC LEFT OVARIAN CYSTECTOMY WITH LYSIS OF ADHESIONS;  Surgeon: Hoover Browns, MD;  Location: [facility] OR;  Service: Gynecology;  Laterality: N/A;   LAPAROSCOPY N/A 10/31/2016   Procedure: LAPAROSCOPY DIAGNOSTIC;  Surgeon: Hoover Browns, MD;  Location: [facility] ORS;  Service: Gynecology;  Laterality: N/A;   NASAL SINUS SURGERY     TONSILLECTOMY  2008   WISDOM TOOTH EXTRACTION      Family History  Problem Relation Age of Onset  Allergies Sister    Allergies Mother    Asthma Mother    Allergies Father     Social History:  reports that she has been smoking cigarettes and e-cigarettes. She started smoking about 8 years ago. She has a 1 pack-year smoking history. She has never used smokeless tobacco. She reports that she does not drink alcohol and does not use drugs.  Allergies:  Allergies  Allergen Reactions   Penicillins Anaphylaxis    ++++ABLE TO TAKE ROCEPHIN++++ Has patient had a PCN reaction causing immediate rash, facial/tongue/throat swelling, SOB or lightheadedness with hypotension: yes Has patient had a PCN reaction causing severe rash involving mucus membranes or skin necrosis: unknown Has patient had a PCN reaction  that required hospitalization no Has patient had a PCN reaction occurring within the last 10 years: yes If all of the above answers are "NO", then may proceed with Cephalosporin use.    Shrimp [Shellfish Allergy] Anaphylaxis   Plecanatide Other (See Comments)    Trulance Pitting edema    Amoxicillin-Pot Clavulanate    Clarithromycin Other (See Comments)    Gi -upset   Egg-Derived Products Nausea Only    Yolk   Moxifloxacin Other (See Comments)    Gi- upset   Soy Allergy Nausea Only   Wheat Other (See Comments)    inflammation    Medications Prior to Admission  Medication Sig Dispense Refill Last Dose   albuterol (VENTOLIN HFA) 108 (90 Base) MCG/ACT inhaler Inhale 2 puffs into the lungs every 4 (four) hours as needed for shortness of breath.      ALPRAZolam (XANAX) 1 MG tablet Take 1 tablet (1 mg total) by mouth 2 (two) times daily as needed. for anxiety 60 tablet 2    azelastine (ASTELIN) 0.1 % nasal spray Place 1 spray into both nostrils daily. Use in each nostril as directed      diclofenac Sodium (VOLTAREN) 1 % GEL Apply 1 Application topically daily as needed for pain.      diphenhydrAMINE (BENADRYL) 25 MG tablet Take 1 tablet (25 mg total) by mouth every 6 (six) hours as needed for itching. 20 tablet 0    DULoxetine (CYMBALTA) 60 MG capsule Take 1 capsule (60 mg total) by mouth daily. 30 capsule 2    EPINEPHrine 0.3 mg/0.3 mL IJ SOAJ injection Inject 0.3 mg into the muscle as needed for anaphylaxis. (Patient not taking: Reported on 06/26/2023) 1 each 0    famotidine (PEPCID) 20 MG tablet Take 1 tablet (20 mg total) by mouth every 12 (twelve) hours as needed (rash). 30 tablet 0    gabapentin (NEURONTIN) 600 MG tablet Take 1 tablet (600 mg total) by mouth at bedtime. (Patient taking differently: Take 600 mg by mouth 3 (three) times daily.) 30 tablet 0    hydrocortisone cream 1 % Apply to affected area 2 times daily 15 g 0    hydrOXYzine (ATARAX) 10 MG tablet Take 1-2 tablets (10-20  mg total) by mouth every 8 (eight) hours as needed. 60 tablet 5    LINZESS 290 MCG CAPS capsule       loratadine (CLARITIN) 10 MG tablet Take 10 mg by mouth daily.      LUPRON DEPOT, 62-MONTH, 11.25 MG injection SMARTSIG:11.25 Milligram(s) SUB-Q Every 3 Months (Patient not taking: Reported on 06/26/2023)      methocarbamol (ROBAXIN) 500 MG tablet Take 1 tablet (500 mg total) by mouth every 8 (eight) hours as needed for muscle spasms. 90 tablet 3  ondansetron (ZOFRAN) 4 MG tablet Take 4 mg by mouth every 8 (eight) hours as needed for nausea or vomiting.      senna (SENOKOT) 8.6 MG tablet Take 2 tablets by mouth daily.      SUMAtriptan (IMITREX) 50 MG tablet Take 50 mg by mouth as needed for migraine.       Review of Systems  Constitutional: Negative.   HENT: Negative.    Eyes: Negative.   Respiratory: Negative.    Cardiovascular: Negative.   Gastrointestinal:  Positive for nausea.  Endocrine: Negative.   Genitourinary:  Positive for pelvic pain.  Musculoskeletal: Negative.   Skin: Negative.   Allergic/Immunologic: Negative.   Neurological: Negative.   Hematological: Negative.   Psychiatric/Behavioral: Negative.      Blood pressure 133/83, pulse 98, temperature 97.7 F (36.5 C), temperature source Oral, resp. rate 15, height 5\' 4"  (1.626 m), weight 79.8 kg, SpO2 100%. Physical Exam Constitutional:      General: She is not in acute distress.    Appearance: Normal appearance.  HENT:     Head: Normocephalic and atraumatic.     Nose: Nose normal.     Mouth/Throat:     Mouth: Mucous membranes are moist.  Cardiovascular:     Rate and Rhythm: Normal rate and regular rhythm.     Pulses: Normal pulses.     Heart sounds: Normal heart sounds.  Pulmonary:     Effort: Pulmonary effort is normal.     Breath sounds: Normal breath sounds.  Abdominal:     General: Bowel sounds are normal.     Palpations: Abdomen is soft.     Tenderness: There is abdominal tenderness. There is no guarding  or rebound.  Musculoskeletal:        General: No swelling.     Cervical back: Normal range of motion and neck supple.  Skin:    General: Skin is warm.  Neurological:     General: No focal deficit present.     Mental Status: She is alert and oriented to person, place, and time.  Psychiatric:        Mood and Affect: Mood normal.        Behavior: Behavior normal.     Results for orders placed or performed during the hospital encounter of 08/09/23 (from the past 24 hour(s))  CBC with Differential     Status: Abnormal   Collection Time: 08/09/23  9:52 PM  Result Value Ref Range   WBC 13.6 (H) 4.0 - 10.5 K/uL   RBC 4.68 3.87 - 5.11 MIL/uL   Hemoglobin 14.4 12.0 - 15.0 g/dL   HCT 01.0 27.2 - 53.6 %   MCV 92.1 80.0 - 100.0 fL   MCH 30.8 26.0 - 34.0 pg   MCHC 33.4 30.0 - 36.0 g/dL   RDW 64.4 03.4 - 74.2 %   Platelets 286 150 - 400 K/uL   nRBC 0.0 0.0 - 0.2 %   Neutrophils Relative % 67 %   Neutro Abs 9.1 (H) 1.7 - 7.7 K/uL   Lymphocytes Relative 22 %   Lymphs Abs 3.1 0.7 - 4.0 K/uL   Monocytes Relative 5 %   Monocytes Absolute 0.7 0.1 - 1.0 K/uL   Eosinophils Relative 5 %   Eosinophils Absolute 0.6 (H) 0.0 - 0.5 K/uL   Basophils Relative 1 %   Basophils Absolute 0.1 0.0 - 0.1 K/uL   Immature Granulocytes 0 %   Abs Immature Granulocytes 0.05 0.00 - 0.07 K/uL  Comprehensive  metabolic panel     Status: Abnormal   Collection Time: 08/09/23  9:52 PM  Result Value Ref Range   Sodium 137 135 - 145 mmol/L   Potassium 3.9 3.5 - 5.1 mmol/L   Chloride 104 98 - 111 mmol/L   CO2 25 22 - 32 mmol/L   Glucose, Bld 100 (H) 70 - 99 mg/dL   BUN 14 6 - 20 mg/dL   Creatinine, Ser 4.09 0.44 - 1.00 mg/dL   Calcium 8.9 8.9 - 81.1 mg/dL   Total Protein 6.0 (L) 6.5 - 8.1 g/dL   Albumin 3.4 (L) 3.5 - 5.0 g/dL   AST 25 15 - 41 U/L   ALT 23 0 - 44 U/L   Alkaline Phosphatase 77 38 - 126 U/L   Total Bilirubin 0.4 <1.2 mg/dL   GFR, Estimated >91 >47 mL/min   Anion gap 8 5 - 15  hCG, quantitative,  pregnancy     Status: Abnormal   Collection Time: 08/09/23  9:52 PM  Result Value Ref Range   hCG, Beta Chain, Quant, S 3,461 (H) <5 mIU/mL  I-stat chem 8, ED (not at [facility], [facility] or [facility])     Status: None   Collection Time: 08/09/23 10:17 PM  Result Value Ref Range   Sodium 137 135 - 145 mmol/L   Potassium 3.8 3.5 - 5.1 mmol/L   Chloride 101 98 - 111 mmol/L   BUN 16 6 - 20 mg/dL   Creatinine, Ser 8.29 0.44 - 1.00 mg/dL   Glucose, Bld 98 70 - 99 mg/dL   Calcium, Ion 5.62 1.30 - 1.40 mmol/L   TCO2 24 22 - 32 mmol/L   Hemoglobin 14.6 12.0 - 15.0 g/dL   HCT 86.5 78.4 - 69.6 %  Type and screen [facility]     Status: None (Preliminary result)   Collection Time: 08/10/23 12:00 AM  Result Value Ref Range   ABO/RH(D) PENDING    Antibody Screen PENDING    Sample Expiration      08/13/2023,2359 Performed at [facility], [address] [place], [place] [address]     US OB Transvaginal  Result Date: 08/09/2023 CLINICAL DATA:  Right lower quadrant pain, beta HCG 3,461 EXAM: TRANSVAGINAL OB ULTRASOUND TECHNIQUE: Transvaginal ultrasound was performed for complete evaluation of the gestation as well as the maternal uterus, adnexal regions, and pelvic cul-de-sac. COMPARISON:  None Available. FINDINGS: Intrauterine gestational sac: None Yolk sac:  Not Visualized. Embryo:  Not Visualized. Maternal uterus/adnexae: The uterus is anteverted measuring 8.5 x 3.4 by 5.4 cm. Endometrium measures 6 mm in thickness. 1.6 x 1.0 x 1.5 cm cystic area within the cervix consistent with nabothian cysts. Right ovary measures 2.7 x 2.1 x 2.9 cm. The left ovary is not identified. No adnexal masses or free fluid. IMPRESSION: 1. No evidence of intrauterine pregnancy at this time. Serial beta HCG measurements and follow-up ultrasound recommended to document IUP and exclude ectopic pregnancy. Electronically Signed   By: Sharlet Salina M.D.   On: 08/09/2023 23:42    Assessment/Plan: Ectopic pregnancy on  unknown location.  I discussed with the patient option of laparoscopy with removal of ectopic unilateral salpingostomy possible unilateral salpingectomy vs methotrexate with close observation in patient.  She desires to try methotrexate given history of pelvic adhesive disease.  Will admit to [facility] care for observation.  Pain control. IV fentanyl or dilaudid.  Repeat quantative HCG day 4 and 7 Dr. Sallye Ober to assume care on 08/11/2023  at 7 am.   Gerald Leitz 08/10/2023, 12:57 AM

## 2023-08-10 NOTE — Plan of Care (Signed)

## 2023-08-11 ENCOUNTER — Ambulatory Visit: Payer: Medicaid Other | Admitting: Physical Therapy

## 2023-08-11 DIAGNOSIS — O2691 Pregnancy related conditions, unspecified, first trimester: Secondary | ICD-10-CM | POA: Diagnosis present

## 2023-08-11 MED ORDER — IBUPROFEN 800 MG PO TABS: 800.0000 mg | ORAL_TABLET | Freq: Three times a day (TID) | ORAL | 1 refills | Status: AC | PRN

## 2023-08-11 MED ORDER — PROMETHAZINE HCL 25 MG PO TABS: 25.0000 mg | ORAL_TABLET | Freq: Four times a day (QID) | ORAL | 0 refills | Status: DC | PRN

## 2023-08-11 MED ORDER — ACETAMINOPHEN 500 MG PO TABS
1000.0000 mg | ORAL_TABLET | Freq: Four times a day (QID) | ORAL | 1 refills | Status: AC | PRN
Start: 2023-08-11 — End: ?

## 2023-08-11 MED ORDER — OXYCODONE HCL 5 MG PO TABS: 5.0000 mg | ORAL_TABLET | Freq: Four times a day (QID) | ORAL | 0 refills | Status: DC | PRN

## 2023-08-11 MED ORDER — PROCHLORPERAZINE EDISYLATE 10 MG/2ML IJ SOLN
10.0000 mg | Freq: Four times a day (QID) | INTRAMUSCULAR | Status: DC | PRN
  Administered 2023-08-11: 10 mg via INTRAVENOUS
  Filled 2023-08-11 (×4): qty 2
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
## 2023-08-11 NOTE — Progress Notes (Signed)
RN walked pt to car without issue

## 2023-08-13 ENCOUNTER — Inpatient Hospital Stay (HOSPITAL_COMMUNITY)
Admission: AD | Admit: 2023-08-13 | Discharge: 2023-08-14 | Disposition: A | Discharge status: Home / Self Care | Payer: Medicaid Other | Attending: Obstetrics and Gynecology | Admitting: Obstetrics and Gynecology

## 2023-08-13 ENCOUNTER — Inpatient Hospital Stay (HOSPITAL_COMMUNITY): Payer: Medicaid Other

## 2023-08-13 DIAGNOSIS — J9811 Atelectasis: Secondary | ICD-10-CM | POA: Diagnosis not present

## 2023-08-13 DIAGNOSIS — K5900 Constipation, unspecified: Secondary | ICD-10-CM | POA: Diagnosis not present

## 2023-08-13 DIAGNOSIS — J069 Acute upper respiratory infection, unspecified: Secondary | ICD-10-CM

## 2023-08-13 DIAGNOSIS — R1031 Right lower quadrant pain: Secondary | ICD-10-CM | POA: Insufficient documentation

## 2023-08-13 DIAGNOSIS — N888 Other specified noninflammatory disorders of cervix uteri: Secondary | ICD-10-CM | POA: Diagnosis not present

## 2023-08-13 DIAGNOSIS — R7989 Other specified abnormal findings of blood chemistry: Secondary | ICD-10-CM | POA: Diagnosis not present

## 2023-08-13 DIAGNOSIS — R748 Abnormal levels of other serum enzymes: Secondary | ICD-10-CM

## 2023-08-13 LAB — RESPIRATORY PANEL BY PCR
Adenovirus: NOT DETECTED
Bordetella Parapertussis: NOT DETECTED
Bordetella pertussis: NOT DETECTED
Chlamydophila pneumoniae: NOT DETECTED
Coronavirus 229E: NOT DETECTED
Coronavirus HKU1: NOT DETECTED
Coronavirus NL63: NOT DETECTED
Coronavirus OC43: DETECTED — AB
Influenza A: NOT DETECTED
Influenza B: NOT DETECTED
Metapneumovirus: NOT DETECTED
Mycoplasma pneumoniae: NOT DETECTED
Parainfluenza Virus 1: NOT DETECTED
Parainfluenza Virus 2: NOT DETECTED
Parainfluenza Virus 3: NOT DETECTED
Parainfluenza Virus 4: NOT DETECTED
Respiratory Syncytial Virus: NOT DETECTED
Rhinovirus / Enterovirus: DETECTED — AB

## 2023-08-13 MED ORDER — OXYCODONE-ACETAMINOPHEN 5-325 MG PO TABS
2.0000 | ORAL_TABLET | Freq: Once | ORAL | Status: AC
  Administered 2023-08-13: 2 via ORAL
  Filled 2023-08-13: qty 2

## 2023-08-13 MED ORDER — ONDANSETRON 4 MG PO TBDP
8.0000 mg | ORAL_TABLET | Freq: Once | ORAL | Status: AC
  Administered 2023-08-13: 8 mg via ORAL
  Filled 2023-08-13: qty 2

## 2023-08-13 MED ORDER — CYCLOBENZAPRINE HCL 5 MG PO TABS
10.0000 mg | ORAL_TABLET | Freq: Once | ORAL | Status: AC
  Administered 2023-08-13: 10 mg via ORAL
  Filled 2023-08-13: qty 2

## 2023-08-13 MED ORDER — KETOROLAC TROMETHAMINE 60 MG/2ML IM SOLN
60.0000 mg | Freq: Once | INTRAMUSCULAR | Status: AC
  Administered 2023-08-13: 60 mg via INTRAMUSCULAR
  Filled 2023-08-13: qty 2

## 2023-08-13 NOTE — MAU Provider Note (Cosign Needed)
History     CSN: [id number]  Arrival date and time: 08/13/23 1908   None     Chief Complaint  Patient presents with   Abdominal Pain   Patient presents with possible ectopic pregnancy s/p methotrexate on Sunday. Severe abdominal pain, especially noted on the right side. Reports small to moderate bleeding. Reports running a fever with sore throat past 2 days. Positive sick contacts.        OB History     Gravida  1   Para  1   Term      Preterm  1   AB      Living  1      SAB      IAB      Ectopic      Multiple      Live Births  1           Past Medical History:  Diagnosis Date   ADHD (attention deficit hyperactivity disorder)    Allergy    seasonal   Anxiety    Arthritis    Aspergillosis (HCC)    sinus   Chest pain    none since February 2023   Chronic idiopathic urticaria    Colon disorder    right ascending colon blockage since May of 2021-present   Complication of anesthesia 2015   woke up early, when tube was removal, "I had blood all over my teeth and face, I could not catch my breath"  This was at [facility], Nov 2018 had surgery at [facility]   Dermatographism    Duodenitis    GERD (gastroesophageal reflux disease)    Headache    Herpes    History of kidney stones    Palpitations    history of palpitations right after Covid- none since not working in July 2023   PCOS (polycystic ovarian syndrome)    PID (pelvic inflammatory disease)    Pneumonia    PONV (postoperative nausea and vomiting)    vomiting after tonsillectomy, 2008   Raynaud's disease    Small bowel obstruction [facility])    June 2023- resolved, small intestine collapse- resolved   TMJ (temporomandibular joint disorder)    August 2023    Past Surgical History:  Procedure Laterality Date   APPENDECTOMY     CERVICAL CERCLAGE     ESOPHAGOGASTRODUODENOSCOPY     KNEE SURGERY Right 2007   LAPAROSCOPIC APPENDECTOMY N/A 08/16/2017   Procedure: APPENDECTOMY LAPAROSCOPIC;   Surgeon: Glenna Fellows, MD;  Location: [facility] ORS;  Service: General;  Laterality: N/A;   LAPAROSCOPIC LYSIS OF ADHESIONS N/A 10/31/2016   Procedure: LAPAROSCOPIC LYSIS OF ADHESIONS;  Surgeon: Hoover Browns, MD;  Location: [facility] ORS;  Service: Gynecology;  Laterality: N/A;   LAPAROSCOPIC OVARIAN CYSTECTOMY N/A 05/29/2022   Procedure: LAPAROSCOPIC LEFT OVARIAN CYSTECTOMY WITH LYSIS OF ADHESIONS;  Surgeon: Hoover Browns, MD;  Location: [facility] OR;  Service: Gynecology;  Laterality: N/A;   LAPAROSCOPY N/A 10/31/2016   Procedure: LAPAROSCOPY DIAGNOSTIC;  Surgeon: Hoover Browns, MD;  Location: [facility];  Service: Gynecology;  Laterality: N/A;   NASAL SINUS SURGERY     TONSILLECTOMY  2008   WISDOM TOOTH EXTRACTION      Family History  Problem Relation Age of Onset   Allergies Sister    Allergies Mother    Asthma Mother    Allergies Father     Social History   Tobacco Use   Smoking status: Every Day    Current packs/day: 0.00  Average packs/day: 1 pack/day for 1 year (1.0 ttl pk-yrs)    Types: Cigarettes, E-cigarettes    Start date: 05/25/2015    Last attempt to quit: 05/24/2016    Years since quitting: 7.2   Smokeless tobacco: Never  Vaping Use   Vaping status: Former   Quit date: 04/23/2022  Substance Use Topics   Alcohol use: No    Alcohol/week: 0.0 standard drinks of alcohol   Drug use: No    Allergies:  Allergies  Allergen Reactions   Penicillins Anaphylaxis    ++++ABLE TO TAKE ROCEPHIN++++ Has patient had a PCN reaction causing immediate rash, facial/tongue/throat swelling, SOB or lightheadedness with hypotension: yes Has patient had a PCN reaction causing severe rash involving mucus membranes or skin necrosis: unknown Has patient had a PCN reaction that required hospitalization no Has patient had a PCN reaction occurring within the last 10 years: yes If all of the above answers are "NO", then may proceed with Cephalosporin use.    Shrimp [Shellfish Allergy] Anaphylaxis   Plecanatide Other (See  Comments)    Trulance Pitting edema    Amoxicillin-Pot Clavulanate    Clarithromycin Other (See Comments)    Gi -upset   Moxifloxacin Other (See Comments)    Gi- upset    Medications Prior to Admission  Medication Sig Dispense Refill Last Dose   acetaminophen (TYLENOL) 500 MG tablet Take 2 tablets (1,000 mg total) by mouth every 6 (six) hours as needed for moderate pain (pain score 4-6) or mild pain (pain score 1-3). 30 tablet 1 08/13/2023 at 1400   albuterol (VENTOLIN HFA) 108 (90 Base) MCG/ACT inhaler Inhale 2 puffs into the lungs every 4 (four) hours as needed for shortness of breath.   Past Month   ALPRAZolam (XANAX) 1 MG tablet Take 1 tablet (1 mg total) by mouth 2 (two) times daily as needed. for anxiety 60 tablet 2 Past Week   azelastine (ASTELIN) 0.1 % nasal spray Place 1 spray into both nostrils daily. Use in each nostril as directed   Past Month   diclofenac Sodium (VOLTAREN) 1 % GEL Apply 1 Application topically daily as needed for pain.   Past Month   DULoxetine (CYMBALTA) 60 MG capsule Take 1 capsule (60 mg total) by mouth daily. 30 capsule 2 08/12/2023   gabapentin (NEURONTIN) 600 MG tablet Take 1 tablet (600 mg total) by mouth at bedtime. (Patient taking differently: Take 600 mg by mouth 3 (three) times daily.) 30 tablet 0 08/12/2023 at 2230   loratadine (CLARITIN) 10 MG tablet Take 10 mg by mouth daily.   08/13/2023 at 0700   methocarbamol (ROBAXIN) 500 MG tablet Take 1 tablet (500 mg total) by mouth every 8 (eight) hours as needed for muscle spasms. 90 tablet 3 08/13/2023 at 1330   oxyCODONE (ROXICODONE) 5 MG immediate release tablet Take 1 tablet (5 mg total) by mouth every 6 (six) hours as needed for breakthrough pain. 10 tablet 0 08/13/2023 at 0700   promethazine (PHENERGAN) 25 MG tablet Take 1 tablet (25 mg total) by mouth every 6 (six) hours as needed for nausea or vomiting. 30 tablet 0 08/13/2023 at 0700   diphenhydrAMINE (BENADRYL) 25 MG tablet Take 1 tablet (25 mg  total) by mouth every 6 (six) hours as needed for itching. 20 tablet 0 More than a month   EPINEPHrine 0.3 mg/0.3 mL IJ SOAJ injection Inject 0.3 mg into the muscle as needed for anaphylaxis. (Patient not taking: Reported on 06/26/2023) 1 each 0  famotidine (PEPCID) 20 MG tablet Take 1 tablet (20 mg total) by mouth every 12 (twelve) hours as needed (rash). 30 tablet 0    hydrocortisone cream 1 % Apply to affected area 2 times daily 15 g 0 More than a month   hydrOXYzine (ATARAX) 10 MG tablet Take 1-2 tablets (10-20 mg total) by mouth every 8 (eight) hours as needed. (Patient taking differently: Take 25 mg by mouth every 8 (eight) hours as needed.) 60 tablet 5    ibuprofen (ADVIL) 800 MG tablet Take 1 tablet (800 mg total) by mouth every 8 (eight) hours as needed for moderate pain (pain score 4-6), cramping or mild pain (pain score 1-3) (mild pain). 30 tablet 1    LINZESS 290 MCG CAPS capsule       ondansetron (ZOFRAN) 4 MG tablet Take 4 mg by mouth every 8 (eight) hours as needed for nausea or vomiting.      senna (SENOKOT) 8.6 MG tablet Take 2 tablets by mouth daily.      SUMAtriptan (IMITREX) 50 MG tablet Take 50 mg by mouth as needed for migraine.   More than a month    Review of Systems  Constitutional:  Positive for fatigue and fever. Negative for chills and unexpected weight change.  Respiratory:  Negative for cough and shortness of breath.   Cardiovascular:  Negative for chest pain and palpitations.  Gastrointestinal:  Positive for abdominal pain. Negative for constipation, diarrhea, nausea and vomiting.  Genitourinary:  Positive for vaginal bleeding. Negative for difficulty urinating, flank pain, frequency and urgency.   Physical Exam   Blood pressure 119/79, pulse 94, temperature 98.1 F (36.7 C), temperature source Oral, resp. rate 17, height 5\' 4"  (1.626 m), weight 83.5 kg, SpO2 98%.  Physical Exam Vitals reviewed.  Constitutional:      Appearance: Normal appearance. She is  ill-appearing.  HENT:     Head: Normocephalic.  Cardiovascular:     Rate and Rhythm: Normal rate.     Pulses: Normal pulses.  Pulmonary:     Effort: Pulmonary effort is normal.  Skin:    General: Skin is warm and dry.     Capillary Refill: Capillary refill takes less than 2 seconds.  Neurological:     Mental Status: She is oriented to person, place, and time.  Psychiatric:        Mood and Affect: Mood normal.        Behavior: Behavior normal.        Thought Content: Thought content normal.        Judgment: Judgment normal.      MAU Course  No results found for this or any previous visit (from the past 24 hour(s)). No results found.  MDM PE Labs: [place], beta HcG, respiratory panel EFM Medications  Assessment and Plan  37 y G1 P0101  PUA s/p possible ectopic. Tx with methotrexate.   - Ectopic pregnancy vs. Normal miscarriage bleeding.  - Exam findings discussed. Collect labs and [place].  -   Richardson Landry MSN, CNM 08/13/2023, 9:55 PM   Handoff of care to Dr. Lucianne Muss @ [phone]

## 2023-08-13 NOTE — MAU Note (Signed)
.  [name] is a 37 y.o. at Unknown here in MAU reporting: ectopic pregnancy - was given methotrexate on Sunday. Reports still having sharp abdominal pain mainly on right side and some on the left. Reports still having moderate VB. Reports she was running a fever yesterday and today throat has been sore - no longer running a fever.   Pain score: 10 - abdomen; 7 - throat  Vitals:   08/13/23 1941  BP: 119/79  Pulse: 94  Resp: 17  Temp: 98.1 F (36.7 C)  SpO2: 98%     FHT:NA  Lab orders placed from triage:  none

## 2023-08-14 ENCOUNTER — Inpatient Hospital Stay (HOSPITAL_COMMUNITY): Payer: Medicaid Other

## 2023-08-14 DIAGNOSIS — R1031 Right lower quadrant pain: Secondary | ICD-10-CM

## 2023-08-14 LAB — CBC
HCT: 38.8 % (ref 36.0–46.0)
Hemoglobin: 12.8 g/dL (ref 12.0–15.0)
MCH: 30.1 pg (ref 26.0–34.0)
MCHC: 33 g/dL (ref 30.0–36.0)
MCV: 91.3 fL (ref 80.0–100.0)
Platelets: 240 10*3/uL (ref 150–400)
RBC: 4.25 MIL/uL (ref 3.87–5.11)
RDW: 12.2 % (ref 11.5–15.5)
WBC: 8.7 10*3/uL (ref 4.0–10.5)
nRBC: 0 % (ref 0.0–0.2)

## 2023-08-14 LAB — COMPREHENSIVE METABOLIC PANEL
ALT: 191 U/L — ABNORMAL HIGH (ref 0–44)
AST: 112 U/L — ABNORMAL HIGH (ref 15–41)
Albumin: 3.2 g/dL — ABNORMAL LOW (ref 3.5–5.0)
Alkaline Phosphatase: 74 U/L (ref 38–126)
Anion gap: 7 (ref 5–15)
BUN: 11 mg/dL (ref 6–20)
CO2: 23 mmol/L (ref 22–32)
Calcium: 8.5 mg/dL — ABNORMAL LOW (ref 8.9–10.3)
Chloride: 105 mmol/L (ref 98–111)
Creatinine, Ser: 0.76 mg/dL (ref 0.44–1.00)
GFR, Estimated: >60 mL/min (ref >60)
Glucose, Bld: 99 mg/dL (ref 70–99)
Potassium: 3.7 mmol/L (ref 3.5–5.1)
Sodium: 135 mmol/L (ref 135–145)
Total Bilirubin: 0.5 mg/dL (ref <1.2)
Total Protein: 6.2 g/dL — ABNORMAL LOW (ref 6.5–8.1)

## 2023-08-14 LAB — AMYLASE: Amylase: 39 U/L (ref 28–100)

## 2023-08-14 LAB — LIPASE, BLOOD: Lipase: 39 U/L (ref 11–51)

## 2023-08-14 LAB — HCG, QUANTITATIVE, PREGNANCY: hCG, Beta Chain, Quant, S: 1829 m[IU]/mL — ABNORMAL HIGH (ref <5)

## 2023-08-14 LAB — HEPATITIS PANEL, ACUTE
HCV Ab: NONREACTIVE
Hep A IgM: NONREACTIVE
Hep B C IgM: NONREACTIVE
Hepatitis B Surface Ag: NONREACTIVE

## 2023-08-14 MED ORDER — KETOROLAC TROMETHAMINE 60 MG/2ML IM SOLN: 30.0000 mg | Freq: Once | INTRAMUSCULAR | Status: DC

## 2023-08-14 MED ORDER — OXYCODONE HCL 5 MG PO TABS
5.0000 mg | ORAL_TABLET | Freq: Once | ORAL | Status: AC
  Administered 2023-08-14: 5 mg via ORAL
  Filled 2023-08-14: qty 1

## 2023-08-14 MED ORDER — IOHEXOL 350 MG/ML SOLN
75.0000 mL | Freq: Once | INTRAVENOUS | Status: AC | PRN
  Administered 2023-08-14: 75 mL via INTRAVENOUS

## 2023-08-15 NOTE — Progress Notes (Signed)
Negative hepatitis panel.

## 2023-08-18 ENCOUNTER — Ambulatory Visit: Payer: Medicaid Other | Admitting: Physical Therapy

## 2023-08-23 ENCOUNTER — Other Ambulatory Visit: Payer: Self-pay | Admitting: Urology

## 2023-08-23 DIAGNOSIS — M6289 Other specified disorders of muscle: Secondary | ICD-10-CM

## 2023-08-23 DIAGNOSIS — R350 Frequency of micturition: Secondary | ICD-10-CM

## 2023-08-23 DIAGNOSIS — Z87440 Personal history of urinary (tract) infections: Secondary | ICD-10-CM

## 2023-08-23 DIAGNOSIS — R1030 Lower abdominal pain, unspecified: Secondary | ICD-10-CM

## 2023-08-23 DIAGNOSIS — R3914 Feeling of incomplete bladder emptying: Secondary | ICD-10-CM

## 2023-08-23 DIAGNOSIS — G8929 Other chronic pain: Secondary | ICD-10-CM

## 2023-08-23 DIAGNOSIS — Z87442 Personal history of urinary calculi: Secondary | ICD-10-CM

## 2023-08-23 DIAGNOSIS — K594 Anal spasm: Secondary | ICD-10-CM

## 2023-08-23 DIAGNOSIS — R3915 Urgency of urination: Secondary | ICD-10-CM

## 2023-08-26 ENCOUNTER — Encounter: Payer: Self-pay | Admitting: Physician Assistant

## 2023-08-26 ENCOUNTER — Ambulatory Visit (INDEPENDENT_AMBULATORY_CARE_PROVIDER_SITE_OTHER): Payer: Medicaid Other | Admitting: Physician Assistant

## 2023-08-26 DIAGNOSIS — F4310 Post-traumatic stress disorder, unspecified: Secondary | ICD-10-CM

## 2023-08-26 DIAGNOSIS — G2581 Restless legs syndrome: Secondary | ICD-10-CM

## 2023-08-26 DIAGNOSIS — F4323 Adjustment disorder with mixed anxiety and depressed mood: Secondary | ICD-10-CM | POA: Diagnosis not present

## 2023-08-26 DIAGNOSIS — F411 Generalized anxiety disorder: Secondary | ICD-10-CM | POA: Diagnosis not present

## 2023-08-26 DIAGNOSIS — Z91410 Personal history of adult physical and sexual abuse: Secondary | ICD-10-CM

## 2023-08-26 MED ORDER — DULOXETINE HCL 60 MG PO CPEP: 60.0000 mg | ORAL_CAPSULE | Freq: Every day | ORAL | 2 refills | Status: DC

## 2023-08-26 MED ORDER — GABAPENTIN 600 MG PO TABS: 600.0000 mg | ORAL_TABLET | Freq: Every day | ORAL | 2 refills | Status: DC

## 2023-08-26 MED ORDER — ALPRAZOLAM 1 MG PO TABS: 1.0000 mg | ORAL_TABLET | Freq: Two times a day (BID) | ORAL | 2 refills | Status: DC | PRN
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
## 2023-09-20 ENCOUNTER — Telehealth: Payer: Self-pay | Admitting: Psychiatry

## 2023-09-20 ENCOUNTER — Other Ambulatory Visit: Payer: Self-pay | Admitting: Psychiatry

## 2023-09-20 MED ORDER — GABAPENTIN 600 MG PO TABS: 600.0000 mg | ORAL_TABLET | Freq: Every day | ORAL | 0 refills | Status: DC

## 2023-09-20 NOTE — Telephone Encounter (Signed)
RTC  Asking for RF gabapentin and alprazolam bc pharmacy px.  Informed will not RF controlled substances on weekend.  Sent in #15 gabapentin Meredith Staggers, MD, DFAPA

## 2023-09-21 ENCOUNTER — Other Ambulatory Visit: Payer: Self-pay | Admitting: Physician Assistant

## 2023-09-21 DIAGNOSIS — F411 Generalized anxiety disorder: Secondary | ICD-10-CM

## 2023-09-21 NOTE — Telephone Encounter (Signed)
Due 1/3

## 2023-10-04 ENCOUNTER — Encounter (HOSPITAL_COMMUNITY): Payer: Self-pay

## 2023-10-04 ENCOUNTER — Other Ambulatory Visit: Payer: Self-pay

## 2023-10-04 ENCOUNTER — Emergency Department (HOSPITAL_COMMUNITY): Payer: Medicaid Other

## 2023-10-04 ENCOUNTER — Emergency Department (HOSPITAL_COMMUNITY)
Admission: EM | Admit: 2023-10-04 | Discharge: 2023-10-05 | Disposition: A | Discharge status: Home / Self Care | Payer: Medicaid Other | Attending: Emergency Medicine | Admitting: Emergency Medicine

## 2023-10-04 DIAGNOSIS — R911 Solitary pulmonary nodule: Secondary | ICD-10-CM | POA: Insufficient documentation

## 2023-10-04 DIAGNOSIS — G43901 Migraine, unspecified, not intractable, with status migrainosus: Secondary | ICD-10-CM | POA: Diagnosis not present

## 2023-10-04 DIAGNOSIS — M1990 Unspecified osteoarthritis, unspecified site: Secondary | ICD-10-CM | POA: Insufficient documentation

## 2023-10-04 DIAGNOSIS — R519 Headache, unspecified: Secondary | ICD-10-CM | POA: Diagnosis present

## 2023-10-04 LAB — I-STAT CHEM 8, ED
BUN: 16 mg/dL (ref 6–20)
Calcium, Ion: 1.18 mmol/L (ref 1.15–1.40)
Chloride: 108 mmol/L (ref 98–111)
Creatinine, Ser: 0.8 mg/dL (ref 0.44–1.00)
Glucose, Bld: 103 mg/dL — ABNORMAL HIGH (ref 70–99)
HCT: 42 % (ref 36.0–46.0)
Hemoglobin: 14.3 g/dL (ref 12.0–15.0)
Potassium: 4.4 mmol/L (ref 3.5–5.1)
Sodium: 140 mmol/L (ref 135–145)
TCO2: 23 mmol/L (ref 22–32)

## 2023-10-04 LAB — CBC WITH DIFFERENTIAL/PLATELET
Abs Immature Granulocytes: 0.02 10*3/uL (ref 0.00–0.07)
Basophils Absolute: 0.1 10*3/uL (ref 0.0–0.1)
Basophils Relative: 1 %
Eosinophils Absolute: 0.3 10*3/uL (ref 0.0–0.5)
Eosinophils Relative: 3 %
HCT: 46.3 % — ABNORMAL HIGH (ref 36.0–46.0)
Hemoglobin: 15.4 g/dL — ABNORMAL HIGH (ref 12.0–15.0)
Immature Granulocytes: 0 %
Lymphocytes Relative: 31 %
Lymphs Abs: 3.4 10*3/uL (ref 0.7–4.0)
MCH: 30.1 pg (ref 26.0–34.0)
MCHC: 33.3 g/dL (ref 30.0–36.0)
MCV: 90.4 fL (ref 80.0–100.0)
Monocytes Absolute: 0.6 10*3/uL (ref 0.1–1.0)
Monocytes Relative: 6 %
Neutro Abs: 6.5 10*3/uL (ref 1.7–7.7)
Neutrophils Relative %: 59 %
Platelets: 223 10*3/uL (ref 150–400)
RBC: 5.12 MIL/uL — ABNORMAL HIGH (ref 3.87–5.11)
RDW: 12.4 % (ref 11.5–15.5)
WBC: 11 10*3/uL — ABNORMAL HIGH (ref 4.0–10.5)
nRBC: 0 % (ref 0.0–0.2)

## 2023-10-04 LAB — BASIC METABOLIC PANEL
Anion gap: 6 (ref 5–15)
BUN: 13 mg/dL (ref 6–20)
CO2: 21 mmol/L — ABNORMAL LOW (ref 22–32)
Calcium: 8.8 mg/dL — ABNORMAL LOW (ref 8.9–10.3)
Chloride: 107 mmol/L (ref 98–111)
Creatinine, Ser: 0.83 mg/dL (ref 0.44–1.00)
GFR, Estimated: >60 mL/min (ref >60)
Glucose, Bld: 97 mg/dL (ref 70–99)
Potassium: 3.6 mmol/L (ref 3.5–5.1)
Sodium: 134 mmol/L — ABNORMAL LOW (ref 135–145)

## 2023-10-04 MED ORDER — IOHEXOL 350 MG/ML SOLN
75.0000 mL | Freq: Once | INTRAVENOUS | Status: AC | PRN
  Administered 2023-10-04: 75 mL via INTRAVENOUS

## 2023-10-04 MED ORDER — DIPHENHYDRAMINE HCL 50 MG/ML IJ SOLN
12.5000 mg | Freq: Once | INTRAMUSCULAR | Status: AC
  Administered 2023-10-04: 12.5 mg via INTRAVENOUS
  Filled 2023-10-04: qty 1

## 2023-10-04 MED ORDER — KETOROLAC TROMETHAMINE 30 MG/ML IJ SOLN
15.0000 mg | Freq: Once | INTRAMUSCULAR | Status: AC
  Administered 2023-10-04: 15 mg via INTRAVENOUS
  Filled 2023-10-04: qty 1

## 2023-10-04 MED ORDER — METOCLOPRAMIDE HCL 5 MG/ML IJ SOLN
5.0000 mg | Freq: Once | INTRAMUSCULAR | Status: AC
  Administered 2023-10-04: 5 mg via INTRAVENOUS
  Filled 2023-10-04: qty 2

## 2023-10-04 MED ORDER — SODIUM CHLORIDE 0.9 % IV BOLUS
500.0000 mL | Freq: Once | INTRAVENOUS | Status: AC
Start: 2023-10-04 — End: 2023-10-05
  Administered 2023-10-04: 500 mL via INTRAVENOUS

## 2023-10-04 MED ORDER — DIAZEPAM 5 MG/ML IJ SOLN
2.5000 mg | Freq: Once | INTRAMUSCULAR | Status: AC
  Administered 2023-10-05: 2.5 mg via INTRAVENOUS
  Filled 2023-10-04: qty 2

## 2023-10-04 MED ORDER — DROPERIDOL 2.5 MG/ML IJ SOLN
1.2500 mg | Freq: Once | INTRAMUSCULAR | Status: AC
  Administered 2023-10-05: 1.25 mg via INTRAVENOUS
  Filled 2023-10-04: qty 2

## 2023-10-04 NOTE — Discharge Instructions (Signed)

## 2023-10-04 NOTE — ED Triage Notes (Signed)
 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 Pt states she is now having blurred vision since last night and numbness on L side of her head. States she took her prescribed migraine medicine once yesterday and today. Pt also c/o sharp ringing in L ear. Pt ambulatory to triage.
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
## 2023-10-29 ENCOUNTER — Ambulatory Visit: Payer: Medicaid Other | Admitting: Physician Assistant

## 2023-10-29 NOTE — Progress Notes (Signed)
 No show

## 2023-11-29 ENCOUNTER — Other Ambulatory Visit: Payer: Self-pay | Admitting: Physician Assistant

## 2023-12-11 ENCOUNTER — Other Ambulatory Visit: Payer: Self-pay

## 2023-12-11 ENCOUNTER — Other Ambulatory Visit: Payer: Self-pay | Admitting: Physician Assistant

## 2023-12-11 ENCOUNTER — Telehealth: Payer: Self-pay | Admitting: Physician Assistant

## 2023-12-11 DIAGNOSIS — F411 Generalized anxiety disorder: Secondary | ICD-10-CM

## 2023-12-11 MED ORDER — ALPRAZOLAM 1 MG PO TABS: 1.0000 mg | ORAL_TABLET | Freq: Two times a day (BID) | ORAL | 1 refills | Status: AC | PRN

## 2023-12-11 MED ORDER — DULOXETINE HCL 60 MG PO CPEP: 60.0000 mg | ORAL_CAPSULE | Freq: Every day | ORAL | 1 refills | Status: AC

## 2023-12-11 MED ORDER — GABAPENTIN 600 MG PO TABS
600.0000 mg | ORAL_TABLET | Freq: Every day | ORAL | 1 refills | Status: AC
Start: 2023-12-11 — End: ?

## 2023-12-11 NOTE — Telephone Encounter (Signed)
 Pt received a letter stating that she has been referred out to a different provider per teresa request. She was told that we would provide refills for 2 more months. Letter written in February. She needs refills on her xanax,gabapentin and cymbalta. Pharmacy is [facility] on [address] in [place]

## 2023-12-11 NOTE — Telephone Encounter (Signed)
 Pended 2 mo of RF to [facility].

## 2023-12-12 NOTE — Telephone Encounter (Signed)
 Noted.

## 2024-01-23 ENCOUNTER — Encounter: Payer: Self-pay | Admitting: Radiology

## 2024-02-22 ENCOUNTER — Other Ambulatory Visit: Payer: Self-pay

## 2024-02-22 ENCOUNTER — Emergency Department (HOSPITAL_COMMUNITY)
Admission: EM | Admit: 2024-02-22 | Discharge: 2024-02-22 | Disposition: A | Discharge status: Home / Self Care | Attending: Emergency Medicine | Admitting: Emergency Medicine

## 2024-02-22 ENCOUNTER — Encounter (HOSPITAL_COMMUNITY): Payer: Self-pay

## 2024-02-22 DIAGNOSIS — F17210 Nicotine dependence, cigarettes, uncomplicated: Secondary | ICD-10-CM | POA: Diagnosis not present

## 2024-02-22 DIAGNOSIS — W57XXXA Bitten or stung by nonvenomous insect and other nonvenomous arthropods, initial encounter: Secondary | ICD-10-CM | POA: Diagnosis not present

## 2024-02-22 DIAGNOSIS — R21 Rash and other nonspecific skin eruption: Secondary | ICD-10-CM | POA: Diagnosis not present

## 2024-02-22 DIAGNOSIS — S40261A Insect bite (nonvenomous) of right shoulder, initial encounter: Secondary | ICD-10-CM | POA: Insufficient documentation

## 2024-02-22 MED ORDER — ONDANSETRON 4 MG PO TBDP: 4.0000 mg | ORAL_TABLET | Freq: Three times a day (TID) | ORAL | 0 refills | Status: DC | PRN

## 2024-02-22 MED ORDER — DOXYCYCLINE HYCLATE 100 MG PO CAPS: 100.0000 mg | ORAL_CAPSULE | Freq: Two times a day (BID) | ORAL | 0 refills | Status: DC

## 2024-02-22 MED ORDER — ONDANSETRON 8 MG PO TBDP
8.0000 mg | ORAL_TABLET | Freq: Once | ORAL | Status: AC
  Administered 2024-02-22: 8 mg via ORAL
  Filled 2024-02-22: qty 1

## 2024-02-22 NOTE — ED Notes (Signed)
 Pt/family received d/c paperwork at this time. After going over the paperwork any questions, comments, or concerns were answered to the best of this nurse's knowledge. The pt/family verbally acknowledged the teachings/instructions.

## 2024-02-22 NOTE — ED Provider Notes (Signed)
 [place] EMERGENCY DEPARTMENT AT [facility] Provider Note   CSN: [id number] Arrival date & time: 02/22/24  1516     History  Chief Complaint  Patient presents with   Insect Bite    [name] is a 38 y.o. female.  HPI   38 year old female presents emergency department because of bug bite.  States it has been on there since last Tuesday.  States that she has been scratching at the area as well as been putting peroxide on it as well as topical antibiotic ointment.  States that she noticed some redness extending as well as pain prompting visit the emergency department.  Denies any fever, chills.  Unaware of what bug actually bit her.  Does not think it was a tick.  Past medical history significant for migraine, recurrent sinusitis, IBS, ADHD, aspergillosis, GERD, PCOS, SBO, Raynaud's disease, chronic back pain  Home Medications Prior to Admission medications   Medication Sig Start Date End Date Taking? Authorizing Provider  doxycycline  (VIBRAMYCIN ) 100 MG capsule Take 1 capsule (100 mg total) by mouth 2 (two) times daily. 02/22/24  Yes Neil Balls A, PA  acetaminophen  (TYLENOL ) 500 MG tablet Take 2 tablets (1,000 mg total) by mouth every 6 (six) hours as needed for moderate pain (pain score 4-6) or mild pain (pain score 1-3). 08/11/23   Renea Carrion, MD  albuterol  (VENTOLIN  HFA) 108 [phone] Base) MCG/ACT inhaler Inhale 2 puffs into the lungs every 4 (four) hours as needed for shortness of breath. 03/03/22   [provider]  ALPRAZolam  (XANAX ) 1 MG tablet Take 1 tablet (1 mg total) by mouth 2 (two) times daily as needed. for anxiety 12/11/23   Mozingo, Regina Nattalie, NP  azelastine (ASTELIN) 0.1 % nasal spray Place 1 spray into both nostrils daily. Use in each nostril as directed    [provider]  diclofenac Sodium (VOLTAREN) 1 % GEL Apply 1 Application topically daily as needed for pain. 03/03/22   [provider]  diphenhydrAMINE  (BENADRYL )  25 MG tablet Take 1 tablet (25 mg total) by mouth every 6 (six) hours as needed for itching. 09/16/22   Edson Graces, MD  DULoxetine  (CYMBALTA ) 60 MG capsule Take 1 capsule (60 mg total) by mouth daily. 12/11/23   Verneda Golder, PA-C  EPINEPHrine  0.3 mg/0.3 mL IJ SOAJ injection Inject 0.3 mg into the muscle as needed for anaphylaxis. Patient not taking: Reported on 06/26/2023 12/26/22   Ninetta Basket, MD  famotidine  (PEPCID ) 20 MG tablet Take 1 tablet (20 mg total) by mouth every 12 (twelve) hours as needed (rash). 09/16/22   Edson Graces, MD  gabapentin  (NEURONTIN ) 600 MG tablet Take 1 tablet (600 mg total) by mouth at bedtime. 12/11/23   Mozingo, Regina Nattalie, NP  hydrocortisone  cream 1 % Apply to affected area 2 times daily 05/01/23   Afton Horse T, DO  hydrOXYzine  (ATARAX ) 10 MG tablet Take 1-2 tablets (10-20 mg total) by mouth every 8 (eight) hours as needed. Patient taking differently: Take 25 mg by mouth every 8 (eight) hours as needed. 11/29/22   Marvia Slocumb T, PA-C  ibuprofen  (ADVIL ) 800 MG tablet Take 1 tablet (800 mg total) by mouth every 8 (eight) hours as needed for moderate pain (pain score 4-6), cramping or mild pain (pain score 1-3) (mild pain). 08/11/23   Renea Carrion, MD  Glory Larsen 290 MCG CAPS capsule  10/01/22   [provider]  loratadine  (CLARITIN ) 10 MG tablet Take 10 mg by  mouth daily.    [provider]  methocarbamol  (ROBAXIN ) 500 MG tablet Take 1 tablet (500 mg total) by mouth every 8 (eight) hours as needed for muscle spasms. 05/05/23   Lauretta Ponto, FNP  oxyCODONE  (ROXICODONE ) 5 MG immediate release tablet Take 1 tablet (5 mg total) by mouth every 6 (six) hours as needed for breakthrough pain. Patient not taking: Reported on 08/26/2023 08/11/23   Renea Carrion, MD  promethazine  (PHENERGAN ) 25 MG tablet Take 1 tablet (25 mg total) by mouth every 6 (six) hours as needed for nausea or vomiting. 08/11/23   Renea Carrion, MD       Allergies    Penicillins, Shrimp [shellfish allergy], Plecanatide, Amoxicillin-pot clavulanate, Clarithromycin, and Moxifloxacin    Review of Systems   Review of Systems  All other systems reviewed and are negative.   Physical Exam Updated Vital Signs BP (!) 146/91 (BP Location: Right Arm)   Pulse (!) 106   Temp 98.1 F (36.7 C) (Oral)   Resp 20   Ht 5\' 4"  (1.626 m)   Wt 85.3 kg   SpO2 92%   BMI 32.27 kg/m  Physical Exam Vitals and nursing note reviewed.  Constitutional:      General: She is not in acute distress.    Appearance: She is well-developed.  HENT:     Head: Normocephalic and atraumatic.  Eyes:     Conjunctiva/sclera: Conjunctivae normal.  Cardiovascular:     Rate and Rhythm: Normal rate and regular rhythm.     Heart sounds: No murmur heard. Pulmonary:     Effort: Pulmonary effort is normal. No respiratory distress.     Breath sounds: Normal breath sounds.  Abdominal:     Palpations: Abdomen is soft.     Tenderness: There is no abdominal tenderness.  Musculoskeletal:        General: No swelling.     Cervical back: Neck supple.  Skin:    General: Skin is warm and dry.     Capillary Refill: Capillary refill takes less than 2 seconds.     Comments: Patient with shallow wound appreciated superior aspect of right shoulder.  Minimal ulcerative.  Extending erythema about 1.5 centimeters in diameter.  Area warm to touch.  No obvious palpable fluctuance.  Bedside ultrasound showed no fluid collection.  Neurological:     Mental Status: She is alert.  Psychiatric:        Mood and Affect: Mood normal.     ED Results / Procedures / Treatments   Labs (all labs ordered are listed, but only abnormal results are displayed) Labs Reviewed - No data to display  EKG None  Radiology No results found.  Procedures Procedures    Medications Ordered in ED Medications  ondansetron  (ZOFRAN -ODT) disintegrating tablet 8 mg (8 mg Oral Given 02/22/24 1556)    ED  Course/ Medical Decision Making/ A&P                                 Medical Decision Making Risk Prescription drug management.   This patient presents to the ED for concern of rash, this involves an extensive number of treatment options, and is a complaint that carries with it a high risk of complications and morbidity.  The differential diagnosis includes cellulitis, erysipelas, necrotizing infection, abscess, SJS/TN, bug bite, other   Co morbidities that complicate the patient evaluation  See HPI   Additional history obtained:  Additional history obtained from EMR External records from outside source obtained and reviewed including hospital records   Lab Tests:  N/a   Imaging Studies ordered:  N/a   Cardiac Monitoring: / EKG:  N/a   Consultations Obtained:  N/a   Problem List / ED Course / Critical interventions / Medication management  Rash Reevaluation of the patient showed that the patient stayed the same I have reviewed the patients home medicines and have made adjustments as needed   Social Determinants of Health:  Chronic cigarette use.  Denies illicit drug use.   Test / Admission - Considered:  Rash Vitals signs significant for hypertension blood pressure 146/91. Otherwise within normal range and stable throughout visit. 38 year old female presents emergency department because of bug bite.  States it has been on there since last Tuesday.  States that she has been scratching at the area as well as been putting peroxide on it as well as topical antibiotic ointment.  States that she noticed some redness extending as well as pain prompting visit the emergency department.  Denies any fever, chills.  Unaware of what bug actually bit her.  Does not think it was a tick. On exam, wound appreciated superior aspect of right shoulder.  Ulcerations present where patient has been picking at the area.  Extending erythema that is palpably warm and tender to the  touch.  Bedside ultrasound showed no deep fluid collection.  Concerned that patient is developed cellulitis from prior presumed insect bite that she has been " picking it."  Will place patient on antibiotics empirically.  Will recommend cessation of scratching at area.  Will also recommend local wound care at home as described in AVS.  Close follow-up with PCP recommended for reevaluation in the outpatient setting.  Treatment plan discussed with patient and she acknowledged understanding was agreeable to said plan.  Patient overall well-appearing, afebrile in no acute distress. Worrisome signs and symptoms were discussed with the patient, and the patient acknowledged understanding to return to the ED if noticed. Patient was stable upon discharge.          Final Clinical Impression(s) / ED Diagnoses Final diagnoses:  Insect bite, unspecified site, initial encounter    Rx / DC Orders ED Discharge Orders          Ordered    doxycycline  (VIBRAMYCIN ) 100 MG capsule  2 times daily        02/22/24 1550              U[place] Butter, [place] 02/22/24 1624    Sueellen Emery, MD 02/22/24 [phone]

## 2024-02-22 NOTE — Discharge Instructions (Signed)
 As discussed, will put on antibiotics given concern for bacterial skin infection where the bug bite initially was.  Avoid picking at the area.  Recommend steroid cream over the spreading rash on the right upper back in the form of hydrocortisone  cream of which she can find over-the-counter at your pharmacy.  Also recommend beginning allergy medicine such as Zyrtec/Claritin Aldo Amble which should help as well.  Recommend follow-up with your primary care for reassessment.  Please do not hesitate to return to emergency department if the worrisome signs and symptoms we discussed become apparent.

## 2024-02-22 NOTE — ED Triage Notes (Signed)
 Pt arrives ambulatory to ED with reports of bug bite to right shoulder states she first noticed a week ago and area has become red.

## 2024-02-24 DIAGNOSIS — T7819XA Other adverse food reactions, not elsewhere classified, initial encounter: Secondary | ICD-10-CM | POA: Insufficient documentation

## 2024-08-23 ENCOUNTER — Ambulatory Visit
Admission: RE | Admit: 2024-08-23 | Discharge: 2024-08-23 | Disposition: A | Discharge status: Home / Self Care | Source: Ambulatory Visit | Attending: Nurse Practitioner

## 2024-08-23 VITALS — BP 138/86 | HR 97 | Temp 98.6°F | Resp 16

## 2024-08-23 DIAGNOSIS — J029 Acute pharyngitis, unspecified: Secondary | ICD-10-CM | POA: Diagnosis not present

## 2024-08-23 DIAGNOSIS — J329 Chronic sinusitis, unspecified: Secondary | ICD-10-CM

## 2024-08-23 DIAGNOSIS — R059 Cough, unspecified: Secondary | ICD-10-CM | POA: Diagnosis not present

## 2024-08-23 LAB — POCT RAPID STREP A (OFFICE): Rapid Strep A Screen: NEGATIVE

## 2024-08-23 LAB — POC COVID19/FLU A&B COMBO
Covid Antigen, POC: NEGATIVE
Influenza A Antigen, POC: NEGATIVE
Influenza B Antigen, POC: NEGATIVE

## 2024-08-23 MED ORDER — DOXYCYCLINE HYCLATE 100 MG PO TABS: 100.0000 mg | ORAL_TABLET | Freq: Two times a day (BID) | ORAL | 0 refills | Status: AC

## 2024-08-23 MED ORDER — PROMETHAZINE-DM 6.25-15 MG/5ML PO SYRP: 5.0000 mL | ORAL_SOLUTION | Freq: Four times a day (QID) | ORAL | 0 refills | Status: DC | PRN
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
## 2024-08-23 NOTE — Discharge Instructions (Addendum)
 The COVID/flu test and rapid strep test were negative.  A throat culture is pending.  You will be contacted if the pending test result is abnormal. Take medication as prescribed. Increase fluids and allow for plenty of rest. You may take over-the-counter Tylenol  as needed for pain, fever, or general discomfort. Recommend warm salt water gargles 3-4 times daily as needed for throat pain or discomfort.  You may also use over-the-counter Chloraseptic throat spray or throat lozenges while symptoms persist.   Recommend a soft diet while symptoms persist. For your cough, recommend use of a humidifier in your bedroom at nighttime during sleep and sleeping elevated on pillows while symptoms persist. Continue your current allergy medications. If symptoms fail to improve, recommend follow-up with your primary care physician or with ENT for further evaluation. Follow-up as needed.

## 2024-08-23 NOTE — ED Triage Notes (Addendum)
 Pt present with c/o sore throat, pain when swallowing and coughing x 5 days. Pt states she is concerned of PNA flare up. States her symptoms are worsening.

## 2024-09-17 ENCOUNTER — Encounter (HOSPITAL_COMMUNITY): Payer: Self-pay

## 2024-09-17 ENCOUNTER — Emergency Department (HOSPITAL_COMMUNITY)
Admission: EM | Admit: 2024-09-17 | Discharge: 2024-09-18 | Disposition: A | Discharge status: Home / Self Care | Attending: Emergency Medicine | Admitting: Emergency Medicine

## 2024-09-17 ENCOUNTER — Other Ambulatory Visit: Payer: Self-pay

## 2024-09-17 ENCOUNTER — Emergency Department (HOSPITAL_COMMUNITY)

## 2024-09-17 DIAGNOSIS — N390 Urinary tract infection, site not specified: Secondary | ICD-10-CM | POA: Diagnosis not present

## 2024-09-17 DIAGNOSIS — N132 Hydronephrosis with renal and ureteral calculous obstruction: Secondary | ICD-10-CM | POA: Insufficient documentation

## 2024-09-17 DIAGNOSIS — Z79899 Other long term (current) drug therapy: Secondary | ICD-10-CM | POA: Diagnosis not present

## 2024-09-17 DIAGNOSIS — N200 Calculus of kidney: Secondary | ICD-10-CM

## 2024-09-17 DIAGNOSIS — R509 Fever, unspecified: Secondary | ICD-10-CM | POA: Diagnosis present

## 2024-09-17 DIAGNOSIS — D72829 Elevated white blood cell count, unspecified: Secondary | ICD-10-CM | POA: Insufficient documentation

## 2024-09-17 LAB — URINALYSIS, ROUTINE W REFLEX MICROSCOPIC
Bilirubin Urine: NEGATIVE
Glucose, UA: NEGATIVE mg/dL
Ketones, ur: NEGATIVE mg/dL
Nitrite: NEGATIVE
Protein, ur: 30 mg/dL — AB
Specific Gravity, Urine: 1.028 (ref 1.005–1.030)
WBC, UA: >50 WBC/hpf (ref 0–5)
pH: 5 (ref 5.0–8.0)

## 2024-09-17 LAB — COMPREHENSIVE METABOLIC PANEL WITH GFR
ALT: 19 U/L (ref 0–44)
AST: 21 U/L (ref 15–41)
Albumin: 4.4 g/dL (ref 3.5–5.0)
Alkaline Phosphatase: 95 U/L (ref 38–126)
Anion gap: 16 — ABNORMAL HIGH (ref 5–15)
BUN: 13 mg/dL (ref 6–20)
CO2: 20 mmol/L — ABNORMAL LOW (ref 22–32)
Calcium: 9.2 mg/dL (ref 8.9–10.3)
Chloride: 104 mmol/L (ref 98–111)
Creatinine, Ser: 0.77 mg/dL (ref 0.44–1.00)
GFR, Estimated: >60 mL/min
Glucose, Bld: 99 mg/dL (ref 70–99)
Potassium: 3.8 mmol/L (ref 3.5–5.1)
Sodium: 140 mmol/L (ref 135–145)
Total Bilirubin: 0.2 mg/dL (ref 0.0–1.2)
Total Protein: 7.4 g/dL (ref 6.5–8.1)

## 2024-09-17 LAB — CBC
HCT: 46.2 % — ABNORMAL HIGH (ref 36.0–46.0)
Hemoglobin: 15.5 g/dL — ABNORMAL HIGH (ref 12.0–15.0)
MCH: 29.6 pg (ref 26.0–34.0)
MCHC: 33.5 g/dL (ref 30.0–36.0)
MCV: 88.2 fL (ref 80.0–100.0)
Platelets: 322 K/uL (ref 150–400)
RBC: 5.24 MIL/uL — ABNORMAL HIGH (ref 3.87–5.11)
RDW: 12.7 % (ref 11.5–15.5)
WBC: 12 K/uL — ABNORMAL HIGH (ref 4.0–10.5)
nRBC: 0 % (ref 0.0–0.2)

## 2024-09-17 LAB — RESP PANEL BY RT-PCR (RSV, FLU A&B, COVID)  RVPGX2
Influenza A by PCR: NEGATIVE
Influenza B by PCR: NEGATIVE
Resp Syncytial Virus by PCR: NEGATIVE
SARS Coronavirus 2 by RT PCR: NEGATIVE

## 2024-09-17 LAB — POC URINE PREG, ED: Preg Test, Ur: NEGATIVE

## 2024-09-17 LAB — LIPASE, BLOOD: Lipase: 19 U/L (ref 11–51)

## 2024-09-17 MED ORDER — PANTOPRAZOLE SODIUM 40 MG IV SOLR
40.0000 mg | Freq: Once | INTRAVENOUS | Status: AC
  Administered 2024-09-18: 40 mg via INTRAVENOUS
  Filled 2024-09-17: qty 10

## 2024-09-17 MED ORDER — LACTATED RINGERS IV BOLUS
1000.0000 mL | Freq: Once | INTRAVENOUS | Status: AC
  Administered 2024-09-18: 1000 mL via INTRAVENOUS

## 2024-09-17 MED ORDER — ONDANSETRON HCL 4 MG/2ML IJ SOLN
4.0000 mg | Freq: Once | INTRAMUSCULAR | Status: AC
  Administered 2024-09-18: 4 mg via INTRAVENOUS
  Filled 2024-09-17: qty 2

## 2024-09-17 MED ORDER — FENTANYL CITRATE (PF) 100 MCG/2ML IJ SOLN
50.0000 ug | Freq: Once | INTRAMUSCULAR | Status: AC
  Administered 2024-09-18: 50 ug via INTRAVENOUS
  Filled 2024-09-17: qty 2

## 2024-09-17 MED ORDER — IOHEXOL 300 MG/ML  SOLN
100.0000 mL | Freq: Once | INTRAMUSCULAR | Status: AC | PRN
  Administered 2024-09-18: 100 mL via INTRAVENOUS

## 2024-09-17 NOTE — ED Provider Notes (Signed)
 " [place] EMERGENCY DEPARTMENT AT [facility] Provider Note   CSN: [id number] Arrival date & time: 09/17/24  2015     Patient presents with: Emesis   [name] is a 38 y.o. female.   38 year old female with a history of appendicitis status post appendectomy complicated by recurrent small bowel obstructions afterwards requiring at least 1 adhesiolysis.  Also has a history of ovarian cyst removal.  She states that at 1 point she has some type of mass in her stomach but then on it and exploratory laparoscopy they did not see it.  Has had recurrent gallbladder issues in the past also kidney stones.  She has a history of some type of trauma that caused multiple neurologic issues that are still being worked up but also some undiagnosed autoimmune disease.  She states she started with vomiting dark and coffee-ground emesis 3 days ago.  This persisted.  Today she had an episode where she felt a little bit better but that she started having pretty severe right upper quadrant abdominal pain that radiated towards her back.  She also had a fever.  She states that she has had decreased intake today because of the discomfort and vomiting.  No blood in her stools.  No constipation.  She states he does have history of reflux and does not use NSAIDs.  Does not drink alcohol.  Unsure if she is ever had ulcers.   Emesis      Prior to Admission medications  Medication Sig Start Date End Date Taking? Authorizing Provider  levofloxacin  (LEVAQUIN ) 500 MG tablet Take 1 tablet (500 mg total) by mouth daily. 09/18/24  Yes Kamerin Axford, Selinda, MD  ondansetron  (ZOFRAN ) 4 MG tablet Take 1 tablet (4 mg total) by mouth every 8 (eight) hours as needed for nausea or vomiting. 09/18/24  Yes Hameed Kolar, Selinda, MD  oxyCODONE -acetaminophen  (PERCOCET/ROXICET) 5-325 MG tablet Take 1 tablet by mouth every 6 (six) hours as needed for severe pain (pain score 7-10). 09/18/24  Yes Elianie Hubers, Selinda, MD  promethazine  (PHENERGAN )  25 MG suppository Place 1 suppository (25 mg total) rectally every 6 (six) hours as needed for nausea or vomiting. 09/18/24  Yes Neev Mcmains, Selinda, MD  promethazine  (PHENERGAN ) 25 MG tablet Take 1 tablet (25 mg total) by mouth every 6 (six) hours as needed for nausea or vomiting. 09/18/24  Yes Mingo Siegert, Selinda, MD  tamsulosin  (FLOMAX ) 0.4 MG CAPS capsule Take 1 capsule (0.4 mg total) by mouth daily. Until stone passes 09/18/24  Yes Gage Weant, Selinda, MD  acetaminophen  (TYLENOL ) 500 MG tablet Take 2 tablets (1,000 mg total) by mouth every 6 (six) hours as needed for moderate pain (pain score 4-6) or mild pain (pain score 1-3). 08/11/23   Henry Slough, MD  albuterol  (VENTOLIN  HFA) 108 [phone] Base) MCG/ACT inhaler Inhale 2 puffs into the lungs every 4 (four) hours as needed for shortness of breath. 03/03/22   [provider]  ALPRAZolam  (XANAX ) 1 MG tablet Take 1 tablet (1 mg total) by mouth 2 (two) times daily as needed. for anxiety 12/11/23   Mozingo, Regina Nattalie, NP  azelastine (ASTELIN) 0.1 % nasal spray Place 1 spray into both nostrils daily. Use in each nostril as directed    [provider]  diclofenac Sodium (VOLTAREN) 1 % GEL Apply 1 Application topically daily as needed for pain. 03/03/22   [provider]  diphenhydrAMINE  (BENADRYL ) 25 MG tablet Take 1 tablet (25 mg total) by mouth every 6 (six) hours as needed for itching.  09/16/22   Theadore Ozell HERO, MD  DULoxetine  (CYMBALTA ) 60 MG capsule Take 1 capsule (60 mg total) by mouth daily. 12/11/23   Rhys Verneita DASEN, PA-C  EPINEPHrine  0.3 mg/0.3 mL IJ SOAJ injection Inject 0.3 mg into the muscle as needed for anaphylaxis. Patient not taking: Reported on 06/26/2023 12/26/22   Yolande Lamar BROCKS, MD  famotidine  (PEPCID ) 20 MG tablet Take 1 tablet (20 mg total) by mouth every 12 (twelve) hours as needed (rash). 09/16/22   Theadore Ozell HERO, MD  gabapentin  (NEURONTIN ) 600 MG tablet Take 1 tablet (600 mg total) by mouth at bedtime. 12/11/23    Mozingo, Regina Nattalie, NP  hydrocortisone  cream 1 % Apply to affected area 2 times daily 05/01/23   Mannie Pac T, DO  hydrOXYzine  (ATARAX ) 10 MG tablet Take 1-2 tablets (10-20 mg total) by mouth every 8 (eight) hours as needed. Patient taking differently: Take 25 mg by mouth every 8 (eight) hours as needed. 11/29/22   Rhys Verneita DASEN, PA-C  ibuprofen  (ADVIL ) 800 MG tablet Take 1 tablet (800 mg total) by mouth every 8 (eight) hours as needed for moderate pain (pain score 4-6), cramping or mild pain (pain score 1-3) (mild pain). 08/11/23   Henry Slough, MD  VESTA 290 MCG CAPS capsule  10/01/22   [provider]  loratadine  (CLARITIN ) 10 MG tablet Take 10 mg by mouth daily.    [provider]  methocarbamol  (ROBAXIN ) 500 MG tablet Take 1 tablet (500 mg total) by mouth every 8 (eight) hours as needed for muscle spasms. 05/05/23   Gerldine Lauraine BROCKS, FNP  oxyCODONE  (ROXICODONE ) 5 MG immediate release tablet Take 1 tablet (5 mg total) by mouth every 6 (six) hours as needed for breakthrough pain. Patient not taking: Reported on 08/26/2023 08/11/23   Henry Slough, MD  promethazine -dextromethorphan (PROMETHAZINE -DM) 6.25-15 MG/5ML syrup Take 5 mLs by mouth 4 (four) times daily as needed. 08/23/24   Leath-Warren, Etta PARAS, NP    Allergies: Penicillins, Shrimp [shellfish allergy], Plecanatide, Amoxicillin-pot clavulanate, Clarithromycin, and Moxifloxacin    Review of Systems  Gastrointestinal:  Positive for vomiting.    Updated Vital Signs BP 119/68   Pulse 68   Temp 98.5 F (36.9 C) (Oral)   Resp 18   Ht 5' 4 (1.626 m)   Wt 82.6 kg   LMP 08/18/2024 (Exact Date)   SpO2 95%   BMI 31.24 kg/m   Physical Exam Vitals and nursing note reviewed.  Constitutional:      Appearance: She is well-developed.  HENT:     Head: Normocephalic and atraumatic.  Cardiovascular:     Rate and Rhythm: Normal rate and regular rhythm.  Pulmonary:     Effort: No respiratory distress.      Breath sounds: No stridor.  Abdominal:     General: There is no distension.     Tenderness: There is abdominal tenderness (ruq).  Musculoskeletal:     Cervical back: Normal range of motion.  Neurological:     Mental Status: She is alert.     (all labs ordered are listed, but only abnormal results are displayed) Labs Reviewed  COMPREHENSIVE METABOLIC PANEL WITH GFR - Abnormal; Notable for the following components:      Result Value   CO2 20 (*)    Anion gap 16 (*)    All other components within normal limits  CBC - Abnormal; Notable for the following components:   WBC 12.0 (*)    RBC 5.24 (*)  Hemoglobin 15.5 (*)    HCT 46.2 (*)    All other components within normal limits  URINALYSIS, ROUTINE W REFLEX MICROSCOPIC - Abnormal; Notable for the following components:   Color, Urine AMBER (*)    APPearance CLOUDY (*)    Hgb urine dipstick SMALL (*)    Protein, ur 30 (*)    Leukocytes,Ua LARGE (*)    Bacteria, UA RARE (*)    All other components within normal limits  URINALYSIS, ROUTINE W REFLEX MICROSCOPIC - Abnormal; Notable for the following components:   APPearance HAZY (*)    Hgb urine dipstick MODERATE (*)    Protein, ur 30 (*)    Leukocytes,Ua LARGE (*)    Bacteria, UA RARE (*)    Crystals PRESENT (*)    All other components within normal limits  RESP PANEL BY RT-PCR (RSV, FLU A&B, COVID)  RVPGX2  LIPASE, BLOOD  OCCULT BLOOD, GASTRIC FLUID (SPECIMEN CUP)  POC URINE PREG, ED    EKG: None  Radiology: CT ABDOMEN PELVIS W CONTRAST Result Date: 09/18/2024 EXAM: CT ABDOMEN AND PELVIS WITH CONTRAST 09/18/2024 12:35:13 AM TECHNIQUE: CT of the abdomen and pelvis was performed with the administration of 100 mL iohexol  300 MG/ML solution. Multiplanar reformatted images are provided for review. Automated exposure control, iterative reconstruction, and/or weight-based adjustment of the mA/kV was utilized to reduce the radiation dose to as low as reasonably achievable.  COMPARISON: CT with intravenous contrast 08/14/2023 and 01/01/2023. CLINICAL HISTORY: Abdominal pain, acute, nonlocalized; Abdominal/flank pain, stone suspected. FINDINGS: LOWER CHEST: No acute abnormality. LIVER: The liver is 20 cm in length, mildly steatotic without mass. Fatty sparing is noted at the gallbladder fossa in segment 5. GALLBLADDER AND BILE DUCTS: Gallbladder is unremarkable. No biliary ductal dilatation. SPLEEN: No acute abnormality. PANCREAS: No acute abnormality. ADRENAL GLANDS: No acute abnormality. There is no adrenal mass. KIDNEYS, URETERS AND BLADDER: There is a 3 mm right UPJ stone at the level of L2-L3, with mild hydronephrosis and right peripelvic edema. There are no intrarenal stones or further ureteral stones. No renal mass. No perinephric or periureteral stranding. Urinary bladder is unremarkable. GI AND BOWEL: Stomach demonstrates no acute abnormality. The appendix is surgically absent. There is mild to moderate retained stool in the ascending colon. No evidence of colitis or diverticulitis. There is no bowel obstruction. PERITONEUM AND RETROPERITONEUM: No ascites. No free air. VASCULATURE: Aorta is normal in caliber. LYMPH NODES: No lymphadenopathy. REPRODUCTIVE ORGANS: The uterus is intact. The ovaries are not enlarged. There is a 1.7 cm cervical nabothian cyst to the right. BONES AND SOFT TISSUES: No acute osseous abnormality. No focal soft tissue abnormality. IMPRESSION: 1. 3 mm right UPJ stone at the level of L2-L3 with mild hydronephrosis and right peripelvic edema. No further ureteral stones. No intrarenal stones. 2. Constipation. No bowel obstruction or inflammation. 3. Mildly prominent liver with mild steatosis. . Electronically signed by: Francis Quam MD 09/18/2024 12:53 AM EST RP Workstation: [id number]     Procedures   Medications Ordered in the ED  lactated ringers  bolus 1,000 mL (0 mLs Intravenous Stopped 09/18/24 0215)  ondansetron  (ZOFRAN ) injection 4 mg (4 mg  Intravenous Given 09/18/24 0005)  pantoprazole  (PROTONIX ) injection 40 mg (40 mg Intravenous Given 09/18/24 0007)  fentaNYL  (SUBLIMAZE ) injection 50 mcg (50 mcg Intravenous Given 09/18/24 0007)  iohexol  (OMNIPAQUE ) 300 MG/ML solution 100 mL (100 mLs Intravenous Contrast Given 09/18/24 0023)  ketorolac  (TORADOL ) 30 MG/ML injection 15 mg (15 mg Intravenous Given 09/18/24 0215)  HYDROmorphone  (DILAUDID ) injection  0.5 mg (0.5 mg Intravenous Given 09/18/24 0217)  oxyCODONE -acetaminophen  (PERCOCET/ROXICET) 5-325 MG per tablet 2 tablet (2 tablets Oral Given 09/18/24 0334)  levofloxacin  (LEVAQUIN ) tablet 500 mg (500 mg Oral Given 09/18/24 0515)                                    Medical Decision Making Amount and/or Complexity of Data Reviewed Labs: ordered. Radiology: ordered.  Risk Prescription drug management.  Her labs show likely hemoconcentration so we will give some fluids and nausea medication.  Will also give Protonix  in case she does have a bleeding peptic ulcer.  Urine is contaminated but does show blood so could be kidney stone versus gallbladder versus pyelonephritis versus duodenal ulcer.  Get CT scan to further evaluate. CT with a kidney stone.  Repeat urine does appear to be infected.  This with her leukocytosis emesis and pain I discussed with urology.  They felt that since her pain is controlled with medications and overall appears well at this time that discharge was appropriate.  I did recommend strict return precautions for any new or worsening symptoms.  Patient is tolerating p.o.  Pain is mostly resolved.  Vital signs are within normal limits.  Feel she is safe for discharge with close follow-up otherwise return to the ER for any new or worsening symptoms.  Final diagnoses:  Urinary tract infection without hematuria, site unspecified  Kidney stone    ED Discharge Orders          Ordered    oxyCODONE -acetaminophen  (PERCOCET/ROXICET) 5-325 MG tablet  Every 6 hours PRN         09/18/24 0457    ondansetron  (ZOFRAN ) 4 MG tablet  Every 8 hours PRN        09/18/24 0457    tamsulosin  (FLOMAX ) 0.4 MG CAPS capsule  Daily        09/18/24 0501    levofloxacin  (LEVAQUIN ) 500 MG tablet  Daily        09/18/24 0501    promethazine  (PHENERGAN ) 25 MG tablet  Every 6 hours PRN        09/18/24 0501    promethazine  (PHENERGAN ) 25 MG suppository  Every 6 hours PRN        09/18/24 0501               Kajsa Butrum, Selinda, MD 09/18/24 [phone]  "

## 2024-09-17 NOTE — ED Triage Notes (Signed)
 Pt c/o n/v, fever, chills for 3 days. Pt states today vomiting coffee colored.

## 2024-09-18 ENCOUNTER — Emergency Department (HOSPITAL_COMMUNITY)

## 2024-09-18 LAB — URINALYSIS, ROUTINE W REFLEX MICROSCOPIC
Bilirubin Urine: NEGATIVE
Glucose, UA: NEGATIVE mg/dL
Ketones, ur: NEGATIVE mg/dL
Nitrite: NEGATIVE
Protein, ur: 30 mg/dL — AB
RBC / HPF: >50 RBC/hpf (ref 0–5)
Specific Gravity, Urine: 1.028 (ref 1.005–1.030)
WBC, UA: >50 WBC/hpf (ref 0–5)
pH: 5 (ref 5.0–8.0)

## 2024-09-18 MED ORDER — PROMETHAZINE HCL 25 MG PO TABS: 25.0000 mg | ORAL_TABLET | Freq: Four times a day (QID) | ORAL | 0 refills | Status: DC | PRN

## 2024-09-18 MED ORDER — PROMETHAZINE HCL 25 MG RE SUPP: 25.0000 mg | Freq: Four times a day (QID) | RECTAL | 0 refills | Status: DC | PRN

## 2024-09-18 MED ORDER — KETOROLAC TROMETHAMINE 30 MG/ML IJ SOLN
15.0000 mg | Freq: Once | INTRAMUSCULAR | Status: AC
  Administered 2024-09-18: 15 mg via INTRAVENOUS
  Filled 2024-09-18: qty 1

## 2024-09-18 MED ORDER — ONDANSETRON HCL 4 MG PO TABS: 4.0000 mg | ORAL_TABLET | Freq: Three times a day (TID) | ORAL | 0 refills | Status: DC | PRN

## 2024-09-18 MED ORDER — LEVOFLOXACIN 500 MG PO TABS
500.0000 mg | ORAL_TABLET | Freq: Once | ORAL | Status: AC
  Administered 2024-09-18: 500 mg via ORAL
  Filled 2024-09-18: qty 1

## 2024-09-18 MED ORDER — OXYCODONE-ACETAMINOPHEN 5-325 MG PO TABS
2.0000 | ORAL_TABLET | Freq: Once | ORAL | Status: AC
  Administered 2024-09-18: 2 via ORAL
  Filled 2024-09-18: qty 2

## 2024-09-18 MED ORDER — TAMSULOSIN HCL 0.4 MG PO CAPS: 0.4000 mg | ORAL_CAPSULE | Freq: Every day | ORAL | 0 refills | Status: AC

## 2024-09-18 MED ORDER — HYDROMORPHONE HCL 1 MG/ML IJ SOLN
0.5000 mg | Freq: Once | INTRAMUSCULAR | Status: AC
  Administered 2024-09-18: 0.5 mg via INTRAVENOUS
  Filled 2024-09-18: qty 0.5

## 2024-09-18 MED ORDER — LEVOFLOXACIN 500 MG PO TABS: 500.0000 mg | ORAL_TABLET | Freq: Every day | ORAL | 0 refills | Status: DC

## 2024-09-18 MED ORDER — OXYCODONE-ACETAMINOPHEN 5-325 MG PO TABS: 1.0000 | ORAL_TABLET | Freq: Four times a day (QID) | ORAL | 0 refills | Status: DC | PRN

## 2024-09-20 MED FILL — Oxycodone w/ Acetaminophen Tab 5-325 MG: ORAL | Qty: 6 | Status: AC

## 2024-09-20 MED FILL — Ondansetron HCl Tab 4 MG: ORAL | Qty: 4 | Status: AC

## 2024-09-27 ENCOUNTER — Emergency Department (HOSPITAL_COMMUNITY)
Admission: EM | Admit: 2024-09-27 | Discharge: 2024-09-28 | Disposition: A | Discharge status: Home / Self Care | Attending: Emergency Medicine | Admitting: Emergency Medicine

## 2024-09-27 ENCOUNTER — Other Ambulatory Visit: Payer: Self-pay

## 2024-09-27 ENCOUNTER — Encounter (HOSPITAL_COMMUNITY): Payer: Self-pay | Admitting: Emergency Medicine

## 2024-09-27 DIAGNOSIS — N200 Calculus of kidney: Secondary | ICD-10-CM | POA: Diagnosis not present

## 2024-09-27 DIAGNOSIS — R1031 Right lower quadrant pain: Secondary | ICD-10-CM | POA: Diagnosis present

## 2024-09-27 DIAGNOSIS — R1011 Right upper quadrant pain: Secondary | ICD-10-CM | POA: Diagnosis not present

## 2024-09-27 DIAGNOSIS — R739 Hyperglycemia, unspecified: Secondary | ICD-10-CM | POA: Insufficient documentation

## 2024-09-27 DIAGNOSIS — D751 Secondary polycythemia: Secondary | ICD-10-CM | POA: Diagnosis not present

## 2024-09-27 DIAGNOSIS — R112 Nausea with vomiting, unspecified: Secondary | ICD-10-CM

## 2024-09-27 DIAGNOSIS — R109 Unspecified abdominal pain: Secondary | ICD-10-CM

## 2024-09-27 DIAGNOSIS — D72829 Elevated white blood cell count, unspecified: Secondary | ICD-10-CM | POA: Insufficient documentation

## 2024-09-27 LAB — URINALYSIS, ROUTINE W REFLEX MICROSCOPIC
Glucose, UA: NEGATIVE mg/dL
Ketones, ur: NEGATIVE mg/dL
Nitrite: NEGATIVE
Protein, ur: 30 mg/dL — AB
Specific Gravity, Urine: >1.03 — ABNORMAL HIGH (ref 1.005–1.030)
pH: 5.5 (ref 5.0–8.0)

## 2024-09-27 LAB — CBC
HCT: 44.3 % (ref 36.0–46.0)
Hemoglobin: 15.1 g/dL — ABNORMAL HIGH (ref 12.0–15.0)
MCH: 29.8 pg (ref 26.0–34.0)
MCHC: 34.1 g/dL (ref 30.0–36.0)
MCV: 87.4 fL (ref 80.0–100.0)
Platelets: 302 K/uL (ref 150–400)
RBC: 5.07 MIL/uL (ref 3.87–5.11)
RDW: 12.9 % (ref 11.5–15.5)
WBC: 15.8 K/uL — ABNORMAL HIGH (ref 4.0–10.5)
nRBC: 0 % (ref 0.0–0.2)

## 2024-09-27 LAB — URINALYSIS, MICROSCOPIC (REFLEX)

## 2024-09-27 NOTE — ED Triage Notes (Signed)
 Pt arrives to ED c/o continued right sided flank pain. Pt states she was seen here on 09/17/24 and dx with 3mm kidney stone. Pt states she was also told she was having a GI bleed.

## 2024-09-28 ENCOUNTER — Emergency Department (HOSPITAL_COMMUNITY)

## 2024-09-28 ENCOUNTER — Other Ambulatory Visit: Payer: Self-pay

## 2024-09-28 ENCOUNTER — Encounter (HOSPITAL_COMMUNITY): Payer: Self-pay | Admitting: Emergency Medicine

## 2024-09-28 ENCOUNTER — Emergency Department (HOSPITAL_COMMUNITY)
Admission: EM | Admit: 2024-09-28 | Discharge: 2024-09-28 | Disposition: A | Discharge status: Home / Self Care | Source: Ambulatory Visit | Attending: Emergency Medicine | Admitting: Emergency Medicine

## 2024-09-28 DIAGNOSIS — R112 Nausea with vomiting, unspecified: Secondary | ICD-10-CM | POA: Insufficient documentation

## 2024-09-28 DIAGNOSIS — R1011 Right upper quadrant pain: Secondary | ICD-10-CM | POA: Insufficient documentation

## 2024-09-28 DIAGNOSIS — D72829 Elevated white blood cell count, unspecified: Secondary | ICD-10-CM | POA: Insufficient documentation

## 2024-09-28 LAB — URINE DRUG SCREEN
Amphetamines: NEGATIVE
Barbiturates: NEGATIVE
Benzodiazepines: POSITIVE — AB
Cocaine: NEGATIVE
Fentanyl: POSITIVE — AB
Methadone Scn, Ur: NEGATIVE
Opiates: POSITIVE — AB
Tetrahydrocannabinol: NEGATIVE

## 2024-09-28 LAB — CBC
HCT: 43.5 % (ref 36.0–46.0)
Hemoglobin: 14.8 g/dL (ref 12.0–15.0)
MCH: 29.8 pg (ref 26.0–34.0)
MCHC: 34 g/dL (ref 30.0–36.0)
MCV: 87.7 fL (ref 80.0–100.0)
Platelets: 309 K/uL (ref 150–400)
RBC: 4.96 MIL/uL (ref 3.87–5.11)
RDW: 13.1 % (ref 11.5–15.5)
WBC: 20.8 K/uL — ABNORMAL HIGH (ref 4.0–10.5)
nRBC: 0 % (ref 0.0–0.2)

## 2024-09-28 LAB — BASIC METABOLIC PANEL WITH GFR
Anion gap: 13 (ref 5–15)
BUN: 12 mg/dL (ref 6–20)
CO2: 19 mmol/L — ABNORMAL LOW (ref 22–32)
Calcium: 8.9 mg/dL (ref 8.9–10.3)
Chloride: 111 mmol/L (ref 98–111)
Creatinine, Ser: 0.94 mg/dL (ref 0.44–1.00)
GFR, Estimated: >60 mL/min
Glucose, Bld: 106 mg/dL — ABNORMAL HIGH (ref 70–99)
Potassium: 4.3 mmol/L (ref 3.5–5.1)
Sodium: 143 mmol/L (ref 135–145)

## 2024-09-28 LAB — LIPASE, BLOOD: Lipase: 25 U/L (ref 11–51)

## 2024-09-28 LAB — COMPREHENSIVE METABOLIC PANEL WITH GFR
ALT: 20 U/L (ref 0–44)
AST: 23 U/L (ref 15–41)
Albumin: 4.1 g/dL (ref 3.5–5.0)
Alkaline Phosphatase: 104 U/L (ref 38–126)
Anion gap: 15 (ref 5–15)
BUN: 17 mg/dL (ref 6–20)
CO2: 19 mmol/L — ABNORMAL LOW (ref 22–32)
Calcium: 9.1 mg/dL (ref 8.9–10.3)
Chloride: 101 mmol/L (ref 98–111)
Creatinine, Ser: 0.97 mg/dL (ref 0.44–1.00)
GFR, Estimated: >60 mL/min
Glucose, Bld: 111 mg/dL — ABNORMAL HIGH (ref 70–99)
Potassium: 4.4 mmol/L (ref 3.5–5.1)
Sodium: 135 mmol/L (ref 135–145)
Total Bilirubin: 0.6 mg/dL (ref 0.0–1.2)
Total Protein: 7.1 g/dL (ref 6.5–8.1)

## 2024-09-28 LAB — URINALYSIS, ROUTINE W REFLEX MICROSCOPIC
Bacteria, UA: NONE SEEN
Bilirubin Urine: NEGATIVE
Glucose, UA: NEGATIVE mg/dL
Ketones, ur: NEGATIVE mg/dL
Nitrite: NEGATIVE
Protein, ur: 30 mg/dL — AB
Specific Gravity, Urine: 1.029 (ref 1.005–1.030)
pH: 5 (ref 5.0–8.0)

## 2024-09-28 LAB — HEPATIC FUNCTION PANEL
ALT: 21 U/L (ref 0–44)
AST: 23 U/L (ref 15–41)
Albumin: 4.2 g/dL (ref 3.5–5.0)
Alkaline Phosphatase: 106 U/L (ref 38–126)
Bilirubin, Direct: 0.1 mg/dL (ref 0.0–0.2)
Indirect Bilirubin: 0.2 mg/dL — ABNORMAL LOW (ref 0.3–0.9)
Total Bilirubin: 0.3 mg/dL (ref 0.0–1.2)
Total Protein: 7 g/dL (ref 6.5–8.1)

## 2024-09-28 LAB — TYPE AND SCREEN
ABO/RH(D): B POS
Antibody Screen: NEGATIVE

## 2024-09-28 LAB — HCG, SERUM, QUALITATIVE: Preg, Serum: NEGATIVE

## 2024-09-28 LAB — POC OCCULT BLOOD, ED: Fecal Occult Bld: NEGATIVE

## 2024-09-28 MED ORDER — MORPHINE SULFATE (PF) 4 MG/ML IV SOLN
4.0000 mg | Freq: Once | INTRAVENOUS | Status: AC
  Administered 2024-09-28: 4 mg via INTRAVENOUS
  Filled 2024-09-28: qty 1

## 2024-09-28 MED ORDER — LACTATED RINGERS IV BOLUS
1000.0000 mL | Freq: Once | INTRAVENOUS | Status: AC
  Administered 2024-09-28: 1000 mL via INTRAVENOUS

## 2024-09-28 MED ORDER — KETOROLAC TROMETHAMINE 15 MG/ML IJ SOLN
15.0000 mg | Freq: Once | INTRAMUSCULAR | Status: AC
  Administered 2024-09-28: 15 mg via INTRAVENOUS
  Filled 2024-09-28: qty 1

## 2024-09-28 MED ORDER — METOCLOPRAMIDE HCL 5 MG/ML IJ SOLN
10.0000 mg | Freq: Once | INTRAMUSCULAR | Status: AC
  Administered 2024-09-28: 10 mg via INTRAVENOUS
  Filled 2024-09-28: qty 2

## 2024-09-28 MED ORDER — HYDROMORPHONE HCL 1 MG/ML IJ SOLN
1.0000 mg | Freq: Once | INTRAMUSCULAR | Status: AC
  Administered 2024-09-28: 1 mg via INTRAVENOUS
  Filled 2024-09-28 (×2): qty 1

## 2024-09-28 MED ORDER — ONDANSETRON HCL 4 MG/2ML IJ SOLN
4.0000 mg | Freq: Once | INTRAMUSCULAR | Status: AC
  Administered 2024-09-28: 4 mg via INTRAVENOUS
  Filled 2024-09-28: qty 2

## 2024-09-28 MED ORDER — OXYCODONE-ACETAMINOPHEN 5-325 MG PO TABS: 1.0000 | ORAL_TABLET | Freq: Four times a day (QID) | ORAL | 0 refills | Status: AC | PRN

## 2024-09-28 MED ORDER — SODIUM CHLORIDE 0.9 % IV BOLUS
1000.0000 mL | Freq: Once | INTRAVENOUS | Status: AC
  Administered 2024-09-28: 1000 mL via INTRAVENOUS

## 2024-09-28 MED ORDER — ONDANSETRON 4 MG PO TBDP: 4.0000 mg | ORAL_TABLET | Freq: Three times a day (TID) | ORAL | 0 refills | Status: AC | PRN

## 2024-09-28 NOTE — ED Provider Notes (Signed)
 "  EMERGENCY DEPARTMENT AT [facility] Provider Note   CSN: [id number] Arrival date & time: 09/27/24  2245     Patient presents with: Flank Pain   [name] is a 39 y.o. female.   The history is provided by the patient.  Flank Pain   She has history of Raynaud's disease, attention deficit disorder, pelvic inflammatory disease, GERD, polycystic ovarian syndrome and was recently seen in the emergency department with a kidney stone and she comes in with ongoing pain in the right mid and lower abdomen with nausea and vomiting.  She describes coffee-ground emesis.  She has had some intermittent diarrhea, stools have been generally light-colored.  She denies any dysuria.    Prior to Admission medications  Medication Sig Start Date End Date Taking? Authorizing Provider  acetaminophen  (TYLENOL ) 500 MG tablet Take 2 tablets (1,000 mg total) by mouth every 6 (six) hours as needed for moderate pain (pain score 4-6) or mild pain (pain score 1-3). 08/11/23   Henry Slough, MD  albuterol  (VENTOLIN  HFA) 108 [phone] Base) MCG/ACT inhaler Inhale 2 puffs into the lungs every 4 (four) hours as needed for shortness of breath. 03/03/22   [provider]  ALPRAZolam  (XANAX ) 1 MG tablet Take 1 tablet (1 mg total) by mouth 2 (two) times daily as needed. for anxiety 12/11/23   Mozingo, Regina Nattalie, NP  azelastine (ASTELIN) 0.1 % nasal spray Place 1 spray into both nostrils daily. Use in each nostril as directed    [provider]  diclofenac Sodium (VOLTAREN) 1 % GEL Apply 1 Application topically daily as needed for pain. 03/03/22   [provider]  diphenhydrAMINE  (BENADRYL ) 25 MG tablet Take 1 tablet (25 mg total) by mouth every 6 (six) hours as needed for itching. 09/16/22   Theadore Ozell CHRISTELLA, MD  DULoxetine  (CYMBALTA ) 60 MG capsule Take 1 capsule (60 mg total) by mouth daily. 12/11/23   Rhys Verneita DASEN, PA-C  EPINEPHrine  0.3 mg/0.3 mL IJ SOAJ injection Inject  0.3 mg into the muscle as needed for anaphylaxis. Patient not taking: Reported on 06/26/2023 12/26/22   Yolande Lamar BROCKS, MD  famotidine  (PEPCID ) 20 MG tablet Take 1 tablet (20 mg total) by mouth every 12 (twelve) hours as needed (rash). 09/16/22   Theadore Ozell CHRISTELLA, MD  gabapentin  (NEURONTIN ) 600 MG tablet Take 1 tablet (600 mg total) by mouth at bedtime. 12/11/23   Mozingo, Regina Nattalie, NP  hydrocortisone  cream 1 % Apply to affected area 2 times daily 05/01/23   Mannie Pac T, DO  hydrOXYzine  (ATARAX ) 10 MG tablet Take 1-2 tablets (10-20 mg total) by mouth every 8 (eight) hours as needed. Patient taking differently: Take 25 mg by mouth every 8 (eight) hours as needed. 11/29/22   Rhys Verneita T, PA-C  ibuprofen  (ADVIL ) 800 MG tablet Take 1 tablet (800 mg total) by mouth every 8 (eight) hours as needed for moderate pain (pain score 4-6), cramping or mild pain (pain score 1-3) (mild pain). 08/11/23   Henry Slough, MD  levofloxacin  (LEVAQUIN ) 500 MG tablet Take 1 tablet (500 mg total) by mouth daily. 09/18/24   Mesner, Selinda, MD  VESTA 290 MCG CAPS capsule  10/01/22   [provider]  loratadine  (CLARITIN ) 10 MG tablet Take 10 mg by mouth daily.    [provider]  methocarbamol  (ROBAXIN ) 500 MG tablet Take 1 tablet (500 mg total) by mouth every 8 (eight) hours as needed for muscle spasms. 05/05/23   Larocco,  Lauraine BROCKS, FNP  ondansetron  (ZOFRAN ) 4 MG tablet Take 1 tablet (4 mg total) by mouth every 8 (eight) hours as needed for nausea or vomiting. 09/18/24   Mesner, Selinda, MD  oxyCODONE  (ROXICODONE ) 5 MG immediate release tablet Take 1 tablet (5 mg total) by mouth every 6 (six) hours as needed for breakthrough pain. Patient not taking: Reported on 08/26/2023 08/11/23   Henry Slough, MD  oxyCODONE -acetaminophen  (PERCOCET/ROXICET) 5-325 MG tablet Take 1 tablet by mouth every 6 (six) hours as needed for severe pain (pain score 7-10). 09/18/24   Mesner, Selinda, MD  promethazine   (PHENERGAN ) 25 MG suppository Place 1 suppository (25 mg total) rectally every 6 (six) hours as needed for nausea or vomiting. 09/18/24   Mesner, Selinda, MD  promethazine  (PHENERGAN ) 25 MG tablet Take 1 tablet (25 mg total) by mouth every 6 (six) hours as needed for nausea or vomiting. 09/18/24   Mesner, Selinda, MD  promethazine -dextromethorphan (PROMETHAZINE -DM) 6.25-15 MG/5ML syrup Take 5 mLs by mouth 4 (four) times daily as needed. 08/23/24   Leath-Warren, Etta PARAS, NP  tamsulosin  (FLOMAX ) 0.4 MG CAPS capsule Take 1 capsule (0.4 mg total) by mouth daily. Until stone passes 09/18/24   Mesner, Selinda, MD    Allergies: Penicillins, Shrimp [shellfish allergy], Plecanatide, Amoxicillin-pot clavulanate, Clarithromycin, and Moxifloxacin    Review of Systems  Genitourinary:  Positive for flank pain.  All other systems reviewed and are negative.   Updated Vital Signs BP 109/79   Pulse (!) 102   Temp 98.2 F (36.8 C)   Resp 18   Ht 5' 4 (1.626 m)   Wt 82.6 kg   SpO2 95%   BMI 31.24 kg/m   Physical Exam Vitals and nursing note reviewed.   39 year old female, resting comfortably and in no acute distress. Vital signs are significant for borderline elevated heart rate. Oxygen  saturation is 95%, which is normal. Head is normocephalic and atraumatic. PERRLA, EOMI.  Back is nontender in the midline.  There is mild to moderate left CVA tenderness. Lungs are clear without rales, wheezes, or rhonchi. Chest is nontender. Heart has regular rate and rhythm without murmur. Abdomen is soft, flat, with tenderness noted across the lower abdomen and into the right upper quadrant.  There is no rebound or guarding. Neurologic: Awake and alert, moves all extremities equally.  (all labs ordered are listed, but only abnormal results are displayed) Labs Reviewed  URINALYSIS, ROUTINE W REFLEX MICROSCOPIC - Abnormal; Notable for the following components:      Result Value   APPearance CLOUDY (*)    Specific  Gravity, Urine >1.030 (*)    Hgb urine dipstick MODERATE (*)    Bilirubin Urine SMALL (*)    Protein, ur 30 (*)    Leukocytes,Ua MODERATE (*)    All other components within normal limits  BASIC METABOLIC PANEL WITH GFR - Abnormal; Notable for the following components:   CO2 19 (*)    Glucose, Bld 106 (*)    All other components within normal limits  CBC - Abnormal; Notable for the following components:   WBC 15.8 (*)    Hemoglobin 15.1 (*)    All other components within normal limits  URINALYSIS, MICROSCOPIC (REFLEX) - Abnormal; Notable for the following components:   Bacteria, UA FEW (*)    All other components within normal limits  POC URINE PREG, ED    EKG: None  Radiology: No results found.   Procedures   Medications Ordered in the ED - No  data to display                                  Medical Decision Making Amount and/or Complexity of Data Reviewed Labs: ordered. Radiology: ordered.  Risk Prescription drug management.   Persistent abdominal pain with vomiting.  This is a presentation with wide range of treatment options and carries with a high risk of morbidity and complications.  Differential diagnosis includes, but is not limited to, ureterolithiasis with renal colic, pyelonephritis, diverticulitis, bowel obstruction, appendicitis, cholecystitis, pancreatitis.  I have reviewed her past records and do note ED visit on 09/17/2024 where she was found to have a 3 mm right ureteropelvic junction stone with mild hydronephrosis, hepatic steatosis, constipation.  I have ordered a renal stone protocol CT scan.  I have reviewed her initial labs and my interpretation is mild polycythemia which is stable, leukocytosis which is increased slightly, elevated random glucose level, mild metabolic acidosis essentially unchanged and with normal anion gap.  Urinalysis was a contaminated specimen with 6-10 squamous epithelial cells, had mild pyuria significantly improved compared with  09/17/2024, mild microscopic hematuria also improved compared with 09/17/2024.  I have added hepatic function panel and lipase to her laboratory workup, ordered IV fluids and morphine  for pain and ondansetron  for nausea.  I have reviewed her additional laboratory test, my interpretation is normal hepatic function panel and lipase.  CT scan shows 3 mm calculus that had been at the right ureteropelvic junction is now in the mid pole of the right kidney with no significant hydronephrosis, no other acute findings.  I have independently viewed the images, and agree with the radiologist's interpretation.  She feels much better following above-noted treatment.  Clearly, the ureteral calculus is not responsible for her pain.  She does carry a diagnosis of irritable bowel syndrome and that may be behind her pain.  There is also history of appendectomy with ruptured appendix and scar tissue may be contributing to her pain.  In any case, I am discharging her with a prescription for ondansetron  oral dissolving tablet.  She has monthly prescriptions for hydrocodone -acetaminophen  5-325 and she is advised that she may continue taking that for pain.  I recommended she follow-up with a urologist regarding her renal calculus and with her gastroenterologist regarding her abdominal pain.  Return precautions discussed.     Final diagnoses:  Abdominal pain, unspecified abdominal location  Nausea and vomiting, unspecified vomiting type  Elevated random blood glucose level  Polycythemia  Nephrolithiasis    ED Discharge Orders          Ordered    oxyCODONE -acetaminophen  (PERCOCET/ROXICET) 5-325 MG tablet  Every 6 hours PRN        09/28/24 0146    ondansetron  (ZOFRAN -ODT) 4 MG disintegrating tablet  Every 8 hours PRN        09/28/24 0146               Raford Lenis, MD 09/28/24 0153  "

## 2024-09-28 NOTE — ED Provider Notes (Signed)
 " [place] EMERGENCY DEPARTMENT AT [facility] Provider Note   CSN: [id number] Arrival date & time: 09/28/24  1211     Patient presents with: Hematemesis   [name] is a 39 y.o. female.   HPI Patient presenting for emesis.  Medical history includes depression, PID, fibromyalgia, IBS, anxiety, migraines, depression.  She was seen at [facility] 10 days ago reporting coffee-ground emesis for the prior 3 days.  She also reported right upper quadrant pain and fever.  Labs at the time showed hemoconcentration.  She was given fluids, antiemetic, and Protonix .  CT scan showed nephrolithiasis.  She was prescribed Phenergan , Flomax , Levaquin , Zofran , Percocet.  She return to the emergency department at [facility] last night with concern of ongoing abdominal pain and coffee-ground emesis.  Lab work showed no drop in hemoglobin.  CT scan last night showed no obstructive uropathy.  She did have a nonobstructive 3 mm stone in midpole of right kidney.  She presents to the ED today for ongoing right flank pain, continued emesis which she describes as coffee-ground in nature.  She states that her symptoms have been refractory to home Percocet, suppository, and dissolvable Zofran .    Prior to Admission medications  Medication Sig Start Date End Date Taking? Authorizing Provider  acetaminophen  (TYLENOL ) 500 MG tablet Take 2 tablets (1,000 mg total) by mouth every 6 (six) hours as needed for moderate pain (pain score 4-6) or mild pain (pain score 1-3). 08/11/23   Henry Slough, MD  albuterol  (VENTOLIN  HFA) [phone] Base) MCG/ACT inhaler Inhale 2 puffs into the lungs every 4 (four) hours as needed for shortness of breath. 03/03/22   [provider]  ALPRAZolam  (XANAX ) 1 MG tablet Take 1 tablet (1 mg total) by mouth 2 (two) times daily as needed. for anxiety 12/11/23   Mozingo, Regina Nattalie, NP  azelastine (ASTELIN) 0.1 % nasal spray Place 1 spray into both nostrils daily. Use in each  nostril as directed    [provider]  diclofenac Sodium (VOLTAREN) 1 % GEL Apply 1 Application topically daily as needed for pain. 03/03/22   [provider]  diphenhydrAMINE  (BENADRYL ) 25 MG tablet Take 1 tablet (25 mg total) by mouth every 6 (six) hours as needed for itching. 09/16/22   Theadore Ozell CHRISTELLA, MD  DULoxetine  (CYMBALTA ) 60 MG capsule Take 1 capsule (60 mg total) by mouth daily. 12/11/23   Rhys Boyer T, PA-C  EPINEPHrine  0.3 mg/0.3 mL IJ SOAJ injection Inject 0.3 mg into the muscle as needed for anaphylaxis. Patient not taking: Reported on 06/26/2023 12/26/22   Yolande Lamar BROCKS, MD  famotidine  (PEPCID ) 20 MG tablet Take 1 tablet (20 mg total) by mouth every 12 (twelve) hours as needed (rash). 09/16/22   Theadore Ozell CHRISTELLA, MD  gabapentin  (NEURONTIN ) 600 MG tablet Take 1 tablet (600 mg total) by mouth at bedtime. 12/11/23   Mozingo, Regina Nattalie, NP  hydrocortisone  cream 1 % Apply to affected area 2 times daily 05/01/23   Mannie Pac T, DO  hydrOXYzine  (ATARAX ) 10 MG tablet Take 1-2 tablets (10-20 mg total) by mouth every 8 (eight) hours as needed. Patient taking differently: Take 25 mg by mouth every 8 (eight) hours as needed. 11/29/22   Rhys Boyer T, PA-C  ibuprofen  (ADVIL ) 800 MG tablet Take 1 tablet (800 mg total) by mouth every 8 (eight) hours as needed for moderate pain (pain score 4-6), cramping or mild pain (pain score 1-3) (mild pain). 08/11/23   Henry,  Jon, MD  VESTA 290 MCG CAPS capsule  10/01/22   [provider]  loratadine  (CLARITIN ) 10 MG tablet Take 10 mg by mouth daily.    [provider]  methocarbamol  (ROBAXIN ) 500 MG tablet Take 1 tablet (500 mg total) by mouth every 8 (eight) hours as needed for muscle spasms. 05/05/23   Gerldine Lauraine BROCKS, FNP  ondansetron  (ZOFRAN -ODT) 4 MG disintegrating tablet Take 1 tablet (4 mg total) by mouth every 8 (eight) hours as needed for nausea or vomiting. 09/28/24   Raford Lenis, MD   oxyCODONE -acetaminophen  (PERCOCET/ROXICET) 5-325 MG tablet Take 1 tablet by mouth every 6 (six) hours as needed for severe pain (pain score 7-10). 09/28/24   Raford Lenis, MD  tamsulosin  (FLOMAX ) 0.4 MG CAPS capsule Take 1 capsule (0.4 mg total) by mouth daily. Until stone passes 09/18/24   Mesner, Selinda, MD    Allergies: Penicillins, Shrimp [shellfish allergy], Plecanatide, Amoxicillin-pot clavulanate, Clarithromycin, and Moxifloxacin    Review of Systems  Gastrointestinal:  Positive for nausea and vomiting.  Genitourinary:  Positive for flank pain.  All other systems reviewed and are negative.   Updated Vital Signs BP 129/72 (BP Location: Right Arm)   Pulse 98   Temp 98.1 F (36.7 C) (Oral)   Resp 18   Ht 5' 4 (1.626 m)   Wt 82.6 kg   LMP 09/22/2024 (Approximate)   SpO2 98%   BMI 31.24 kg/m   Physical Exam Vitals and nursing note reviewed.  Constitutional:      General: She is not in acute distress.    Appearance: Normal appearance. She is well-developed. She is not ill-appearing, toxic-appearing or diaphoretic.  HENT:     Head: Normocephalic and atraumatic.     Right Ear: External ear normal.     Left Ear: External ear normal.     Nose: Nose normal.     Mouth/Throat:     Mouth: Mucous membranes are moist.  Eyes:     Extraocular Movements: Extraocular movements intact.     Conjunctiva/sclera: Conjunctivae normal.  Cardiovascular:     Rate and Rhythm: Normal rate and regular rhythm.  Pulmonary:     Effort: Pulmonary effort is normal. No respiratory distress.  Abdominal:     General: There is no distension.     Palpations: Abdomen is soft.  Musculoskeletal:        General: No swelling. Normal range of motion.     Cervical back: Normal range of motion and neck supple.  Skin:    General: Skin is warm and dry.     Coloration: Skin is not jaundiced or pale.  Neurological:     General: No focal deficit present.     Mental Status: She is alert and oriented to person,  place, and time.  Psychiatric:        Mood and Affect: Mood normal.        Behavior: Behavior normal.     (all labs ordered are listed, but only abnormal results are displayed) Labs Reviewed  COMPREHENSIVE METABOLIC PANEL WITH GFR - Abnormal; Notable for the following components:      Result Value   CO2 19 (*)    Glucose, Bld 111 (*)    All other components within normal limits  CBC - Abnormal; Notable for the following components:   WBC 20.8 (*)    All other components within normal limits  URINE DRUG SCREEN - Abnormal; Notable for the following components:   Opiates POSITIVE (*)  Benzodiazepines POSITIVE (*)    Fentanyl  POSITIVE (*)    All other components within normal limits  URINALYSIS, ROUTINE W REFLEX MICROSCOPIC - Abnormal; Notable for the following components:   APPearance HAZY (*)    Hgb urine dipstick SMALL (*)    Protein, ur 30 (*)    Leukocytes,Ua LARGE (*)    All other components within normal limits  HCG, SERUM, QUALITATIVE  POC OCCULT BLOOD, ED  TYPE AND SCREEN    EKG: None  Radiology: US  PELVIC COMPLETE W TRANSVAGINAL AND TORSION R/O Result Date: 09/28/2024 EXAM: US  Pelvis, Complete Transvaginal and Transabdominal without Doppler TECHNIQUE: Transabdominal and transvaginal pelvic duplex ultrasound using B-mode/gray scaled imaging was obtained. Color flow and spectral Doppler imaging were utilized for evaluation of the right ovary. COMPARISON: US  Pelvis 08/01/2022 and CT abdomen and pelvis 09/28/2024. CLINICAL HISTORY: Flank pain. FINDINGS: UTERUS: Uterus measures 7.3 x 4.3 x 4.9 cm. The uterus is retroverted. Focal hypoechoic lesions demonstrated in the lower uterine segment, the largest measuring 9 mm diameter. These are likely small fibroids. Small Nabothian cysts are also demonstrated in the cervix. ENDOMETRIAL STRIPE: Endometrial stripe measures 5.4 mm. Endometrial stripe appears normal. RIGHT OVARY: Right ovary measures 3.1 x 2.7 x 2.8 cm. Right ovary is  within normal limits. Color flow Doppler imaging demonstrates flow in the right ovary. Spectral Doppler evaluation demonstrates normal arterial and venous flow waveforms in the right ovary. LEFT OVARY: The left ovary is not visualized due to overlying bowel gas. FREE FLUID: No free fluid. IMPRESSION: 1. No sonographic evidence of acute pelvic abnormality or free fluid. 2. Left ovary not visualized due to overlying bowel gas. 3. Normal arterial and venous waveforms in the right ovary. Electronically signed by: Elsie Gravely MD 09/28/2024 06:47 PM EST RP Workstation: [id number]   CT Renal Stone Study Result Date: 09/28/2024 EXAM: CT ABDOMEN AND PELVIS WITHOUT CONTRAST 09/28/2024 01:04:57 AM TECHNIQUE: CT of the abdomen and pelvis was performed without the administration of intravenous contrast. Multiplanar reformatted images are provided for review. Automated exposure control, iterative reconstruction, and/or weight-based adjustment of the mA/kV was utilized to reduce the radiation dose to as low as reasonably achievable. COMPARISON: 09/18/2024 CLINICAL HISTORY: Abdominal/flank pain, stone suspected. FINDINGS: LOWER CHEST: No acute abnormality. LIVER: The liver is unremarkable. GALLBLADDER AND BILE DUCTS: Gallbladder is unremarkable. No biliary ductal dilatation. SPLEEN: No acute abnormality. PANCREAS: No acute abnormality. ADRENAL GLANDS: No acute abnormality. KIDNEYS, URETERS AND BLADDER: The previously seen 3 mm right UPJ stone appears to have migrated proximally and is now located in the mid pole of the right kidney. Continued slight fullness of the right renal collecting system and renal pelvis. The right ureter is decompressed. No stones in the left kidney or left ureter. No left hydronephrosis. No perinephric or periureteral stranding. Urinary bladder is unremarkable. GI AND BOWEL: Stomach demonstrates no acute abnormality. There is no bowel obstruction. PERITONEUM AND RETROPERITONEUM: No ascites. No free  air. VASCULATURE: Aorta is normal in caliber. LYMPH NODES: No lymphadenopathy. REPRODUCTIVE ORGANS: No acute abnormality. BONES AND SOFT TISSUES: No acute osseous abnormality. No focal soft tissue abnormality. IMPRESSION: 1. 3 mm stone in the mid pole of the right kidney, previously at the right UPJ. 2. Continued slight fullness of the right renal collecting system and renal pelvis with decompressed ureter. Electronically signed by: Franky Crease MD 09/28/2024 01:12 AM EST RP Workstation: [id number]     Procedures   Medications Ordered in the ED  lactated ringers  bolus 1,000 mL (0 mLs Intravenous Stopped  09/28/24 2015)  HYDROmorphone  (DILAUDID ) injection 1 mg (1 mg Intravenous Given 09/28/24 1927)  ketorolac  (TORADOL ) 15 MG/ML injection 15 mg (15 mg Intravenous Given 09/28/24 1919)  metoCLOPramide  (REGLAN ) injection 10 mg (10 mg Intravenous Given 09/28/24 1920)                                    Medical Decision Making Amount and/or Complexity of Data Reviewed Labs: ordered. Radiology: ordered.  Risk Prescription drug management.   This patient presents to the ED for concern of flank pain, hematemesis, this involves an extensive number of treatment options, and is a complaint that carries with it a high risk of complications and morbidity.  The differential diagnosis includes cyclic vomiting syndrome, PUD, nephrolithiasis, enteritis, gastritis, GERD, cystic rupture   Co morbidities / Chronic conditions that complicate the patient evaluation  depression, PID, fibromyalgia, IBS, anxiety, migraines, depression   Additional history obtained:  Additional history obtained from EMR External records from outside source obtained and reviewed including N/A   Lab Tests:  I Ordered, and personally interpreted labs.  The pertinent results include: Leukocytosis is present.  Hemoglobin is normal.  Kidney function electrolytes are normal.  Urinalysis not consistent with UTI.   Imaging Studies  ordered:  I ordered imaging studies including pelvic ultrasound I independently visualized and interpreted imaging which showed no acute findings I agree with the radiologist interpretation   Cardiac Monitoring: / EKG:  The patient was maintained on a cardiac monitor.  I personally viewed and interpreted the cardiac monitored which showed an underlying rhythm of: Sinus rhythm   Problem List / ED Course / Critical interventions / Medication management  Patient resenting for right flank pain, nausea, vomiting.  She does describe ongoing hematemesis for the past 2 weeks.  She has been seen in the ED twice over that timeframe.  She was seen in the ED last night.  Despite a report of large-volume blood loss, she has not had any decreases in her hemoglobin on lab work.  On exam, she is well-appearing.  Patient underwent DRE to assess for any occult blood in her stool, which would be present with ongoing upper GI bleed for the past 2 weeks.  Her lab work today is notable for leukocytosis of 20.8.  This may be secondary to demargination from her ongoing vomiting throughout the night.  Notably, hemoglobin remains normal.  Given her ongoing symptoms, Dilaudid , Toradol , and Reglan  were ordered.  IV fluids ordered for her recent fluid losses.  No melena present on DRE.  Hemoccult testing was negative.  I do not suspect upper GI bleeding.  Given absence of any change in his symptoms since last night and negative CT, will not repeat CT imaging.  Pelvic ultrasound was ordered to assess for other etiologies of her right flank discomfort.  Patient underwent ultrasound which did not show any acute findings.  She did have improved symptoms in the ED.  She is stable for discharge. I ordered medication including IV fluid for hydration, Dilaudid  and Toradol  for analgesia, Reglan  for nausea Reevaluation of the patient after these medicines showed that the patient improved I have reviewed the patients home medicines and  have made adjustments as needed  Social Determinants of Health:  Frequent ED visits     Final diagnoses:  Nausea and vomiting, unspecified vomiting type    ED Discharge Orders     None  Melvenia Motto, MD 09/28/24 2129  "

## 2024-09-28 NOTE — Discharge Instructions (Addendum)
 Your test results today were reassuring.  Follow-up with your PCP and gastroenterology.  Return to the emergency department for any new or worsening symptoms of concern.

## 2024-09-28 NOTE — Discharge Instructions (Addendum)
 Your evaluation showed that the kidney stone has moved back up into the kidney and is not what is causing your pain.  Your CT scan did not show any other cause for pain.  It might be related to your known diagnosis of irritable bowel syndrome, could be related to scar tissue in your abdomen from your prior surgeries.  You may continue taking your hydrocodone -acetaminophen  as needed for pain.  Return to the emergency department if symptoms are getting worse.  Otherwise, you should follow-up with the urologist to evaluate the kidney stone, and with your gastroenterologist.

## 2024-09-28 NOTE — ED Triage Notes (Signed)
 Pt c/o black, coffee ground emesis since 12/26, noting that she has vomited at least a gallon of blood in the past 3 days. Pt was seen recently at [facility] for same, dx with renal stone and GI bleed.

## 2024-09-29 ENCOUNTER — Other Ambulatory Visit: Payer: Self-pay

## 2024-09-29 ENCOUNTER — Emergency Department (HOSPITAL_COMMUNITY)
Admission: EM | Admit: 2024-09-29 | Discharge: 2024-09-29 | Disposition: A | Discharge status: Home / Self Care | Attending: Emergency Medicine | Admitting: Emergency Medicine

## 2024-09-29 ENCOUNTER — Encounter (HOSPITAL_COMMUNITY): Payer: Self-pay | Admitting: *Deleted

## 2024-09-29 DIAGNOSIS — R1110 Vomiting, unspecified: Secondary | ICD-10-CM | POA: Diagnosis present

## 2024-09-29 DIAGNOSIS — R112 Nausea with vomiting, unspecified: Secondary | ICD-10-CM | POA: Diagnosis not present

## 2024-09-29 DIAGNOSIS — R10A1 Flank pain, right side: Secondary | ICD-10-CM | POA: Diagnosis not present

## 2024-09-29 LAB — URINALYSIS, ROUTINE W REFLEX MICROSCOPIC
Bacteria, UA: NONE SEEN
Bilirubin Urine: NEGATIVE
Glucose, UA: NEGATIVE mg/dL
Ketones, ur: NEGATIVE mg/dL
Nitrite: NEGATIVE
Protein, ur: NEGATIVE mg/dL
RBC / HPF: >50 RBC/hpf (ref 0–5)
Specific Gravity, Urine: 1.025 (ref 1.005–1.030)
pH: 5 (ref 5.0–8.0)

## 2024-09-29 LAB — COMPREHENSIVE METABOLIC PANEL WITH GFR
ALT: 10 U/L (ref 0–44)
AST: 15 U/L (ref 15–41)
Albumin: 3.5 g/dL (ref 3.5–5.0)
Alkaline Phosphatase: 82 U/L (ref 38–126)
Anion gap: 15 (ref 5–15)
BUN: 19 mg/dL (ref 6–20)
CO2: 19 mmol/L — ABNORMAL LOW (ref 22–32)
Calcium: 8.1 mg/dL — ABNORMAL LOW (ref 8.9–10.3)
Chloride: 108 mmol/L (ref 98–111)
Creatinine, Ser: 0.95 mg/dL (ref 0.44–1.00)
GFR, Estimated: >60 mL/min
Glucose, Bld: 101 mg/dL — ABNORMAL HIGH (ref 70–99)
Potassium: 3.8 mmol/L (ref 3.5–5.1)
Sodium: 141 mmol/L (ref 135–145)
Total Bilirubin: 0.4 mg/dL (ref 0.0–1.2)
Total Protein: 5.9 g/dL — ABNORMAL LOW (ref 6.5–8.1)

## 2024-09-29 LAB — CBC WITH DIFFERENTIAL/PLATELET
Abs Immature Granulocytes: 0.02 K/uL (ref 0.00–0.07)
Basophils Absolute: 0 K/uL (ref 0.0–0.1)
Basophils Relative: 0 %
Eosinophils Absolute: 0.2 K/uL (ref 0.0–0.5)
Eosinophils Relative: 2 %
HCT: 40.1 % (ref 36.0–46.0)
Hemoglobin: 13.5 g/dL (ref 12.0–15.0)
Immature Granulocytes: 0 %
Lymphocytes Relative: 25 %
Lymphs Abs: 2.2 K/uL (ref 0.7–4.0)
MCH: 29.8 pg (ref 26.0–34.0)
MCHC: 33.7 g/dL (ref 30.0–36.0)
MCV: 88.5 fL (ref 80.0–100.0)
Monocytes Absolute: 0.9 K/uL (ref 0.1–1.0)
Monocytes Relative: 10 %
Neutro Abs: 5.7 K/uL (ref 1.7–7.7)
Neutrophils Relative %: 63 %
Platelets: 272 K/uL (ref 150–400)
RBC: 4.53 MIL/uL (ref 3.87–5.11)
RDW: 12.8 % (ref 11.5–15.5)
WBC: 9 K/uL (ref 4.0–10.5)
nRBC: 0 % (ref 0.0–0.2)

## 2024-09-29 LAB — LIPASE, BLOOD: Lipase: 15 U/L (ref 11–51)

## 2024-09-29 LAB — URINE DRUG SCREEN
Amphetamines: NEGATIVE
Barbiturates: NEGATIVE
Benzodiazepines: POSITIVE — AB
Cocaine: NEGATIVE
Fentanyl: POSITIVE — AB
Methadone Scn, Ur: NEGATIVE
Opiates: POSITIVE — AB
Tetrahydrocannabinol: NEGATIVE

## 2024-09-29 LAB — PREGNANCY, URINE: Preg Test, Ur: NEGATIVE

## 2024-09-29 MED ORDER — SODIUM CHLORIDE 0.9 % IV SOLN
25.0000 mg | Freq: Once | INTRAVENOUS | Status: AC
  Administered 2024-09-29: 25 mg via INTRAVENOUS
  Filled 2024-09-29: qty 1

## 2024-09-29 MED ORDER — HYDROMORPHONE HCL 1 MG/ML IJ SOLN
0.5000 mg | Freq: Once | INTRAMUSCULAR | Status: AC
  Administered 2024-09-29: 0.5 mg via INTRAVENOUS
  Filled 2024-09-29: qty 0.5

## 2024-09-29 MED ORDER — KETOROLAC TROMETHAMINE 30 MG/ML IJ SOLN
30.0000 mg | Freq: Once | INTRAMUSCULAR | Status: AC
  Administered 2024-09-29: 30 mg via INTRAVENOUS
  Filled 2024-09-29: qty 1

## 2024-09-29 MED ORDER — METOCLOPRAMIDE HCL 5 MG/ML IJ SOLN
5.0000 mg | Freq: Once | INTRAMUSCULAR | Status: AC
  Administered 2024-09-29: 5 mg via INTRAVENOUS
  Filled 2024-09-29: qty 2

## 2024-09-29 MED ORDER — SODIUM CHLORIDE 0.9 % IV BOLUS
1000.0000 mL | Freq: Once | INTRAVENOUS | Status: AC
  Administered 2024-09-29: 1000 mL via INTRAVENOUS

## 2024-09-29 MED ORDER — HYDROMORPHONE HCL 1 MG/ML IJ SOLN
1.0000 mg | Freq: Once | INTRAMUSCULAR | Status: AC
  Administered 2024-09-29: 1 mg via INTRAVENOUS
  Filled 2024-09-29: qty 1

## 2024-09-29 MED FILL — Oxycodone w/ Acetaminophen Tab 5-325 MG: ORAL | Qty: 6 | Status: AC
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
## 2024-09-29 NOTE — ED Triage Notes (Signed)
 Pt states she has kidney stones and is having right flank pain; pt states she is vomiting  Pt states she was just at [facility] and was discharged but could not make it home and stopped here

## 2024-09-29 NOTE — Discharge Instructions (Addendum)
 You were seen in the ER for vomiting. Your evaluation is reassuring and your labs improved. Your urine showed some possible signs of infection so we are running a culture and will let you know if it comes back positive. You should see your primary care doctor for a recheck, including labs, in the next week or so.  As we discussed, you should follow-up with [facility] urology per your preference and I have provided the phone number.  I am not sure that this kidney stone is causing your vomiting since this has been a chronic issue for you so please follow-up with GI as well, whoever you have seen in the past.  You may have cyclic vomiting or IBS.  Continue your nausea medicines at home as prescribed.  Start a food journal to see if you have any triggers for your nausea and vomiting.  Sip on clear, noncaffeinated fluids all day.  If you develop fevers, cannot hold anything down, any worsening symptoms, please be reevaluated immediately.

## 2024-09-30 ENCOUNTER — Other Ambulatory Visit: Payer: Self-pay

## 2024-09-30 ENCOUNTER — Emergency Department (HOSPITAL_COMMUNITY): Admission: EM | Admit: 2024-09-30 | Discharge: 2024-10-01 | Disposition: A | Discharge status: Home / Self Care

## 2024-09-30 ENCOUNTER — Encounter (HOSPITAL_COMMUNITY): Payer: Self-pay

## 2024-09-30 DIAGNOSIS — R10A1 Flank pain, right side: Secondary | ICD-10-CM | POA: Diagnosis present

## 2024-09-30 DIAGNOSIS — N132 Hydronephrosis with renal and ureteral calculous obstruction: Secondary | ICD-10-CM | POA: Diagnosis not present

## 2024-09-30 DIAGNOSIS — N200 Calculus of kidney: Secondary | ICD-10-CM

## 2024-09-30 NOTE — ED Triage Notes (Signed)
 Pt reports R sided flank pain radiating into the abdomen. Pt reports no relief with phenergan . H/x kidney stones.

## 2024-10-01 ENCOUNTER — Emergency Department (HOSPITAL_COMMUNITY)

## 2024-10-01 LAB — CBC WITH DIFFERENTIAL/PLATELET
Abs Immature Granulocytes: 0.03 K/uL (ref 0.00–0.07)
Basophils Absolute: 0 K/uL (ref 0.0–0.1)
Basophils Relative: 0 %
Eosinophils Absolute: 0.4 K/uL (ref 0.0–0.5)
Eosinophils Relative: 4 %
HCT: 39.5 % (ref 36.0–46.0)
Hemoglobin: 13.3 g/dL (ref 12.0–15.0)
Immature Granulocytes: 0 %
Lymphocytes Relative: 23 %
Lymphs Abs: 2.5 K/uL (ref 0.7–4.0)
MCH: 30.1 pg (ref 26.0–34.0)
MCHC: 33.7 g/dL (ref 30.0–36.0)
MCV: 89.4 fL (ref 80.0–100.0)
Monocytes Absolute: 0.6 K/uL (ref 0.1–1.0)
Monocytes Relative: 5 %
Neutro Abs: 7.3 K/uL (ref 1.7–7.7)
Neutrophils Relative %: 68 %
Platelets: 297 K/uL (ref 150–400)
RBC: 4.42 MIL/uL (ref 3.87–5.11)
RDW: 12.7 % (ref 11.5–15.5)
WBC: 10.9 K/uL — ABNORMAL HIGH (ref 4.0–10.5)
nRBC: 0 % (ref 0.0–0.2)

## 2024-10-01 LAB — BASIC METABOLIC PANEL WITH GFR
Anion gap: 8 (ref 5–15)
BUN: 9 mg/dL (ref 6–20)
CO2: 27 mmol/L (ref 22–32)
Calcium: 8.4 mg/dL — ABNORMAL LOW (ref 8.9–10.3)
Chloride: 104 mmol/L (ref 98–111)
Creatinine, Ser: 0.85 mg/dL (ref 0.44–1.00)
GFR, Estimated: >60 mL/min
Glucose, Bld: 82 mg/dL (ref 70–99)
Potassium: 3.8 mmol/L (ref 3.5–5.1)
Sodium: 139 mmol/L (ref 135–145)

## 2024-10-01 LAB — URINALYSIS, ROUTINE W REFLEX MICROSCOPIC
Bacteria, UA: NONE SEEN
Bilirubin Urine: NEGATIVE
Glucose, UA: NEGATIVE mg/dL
Ketones, ur: NEGATIVE mg/dL
Nitrite: NEGATIVE
Protein, ur: NEGATIVE mg/dL
Specific Gravity, Urine: 1.015 (ref 1.005–1.030)
pH: 6 (ref 5.0–8.0)

## 2024-10-01 LAB — POC URINE PREG, ED: Preg Test, Ur: NEGATIVE

## 2024-10-01 MED ORDER — LACTATED RINGERS IV BOLUS
1000.0000 mL | Freq: Once | INTRAVENOUS | Status: AC
  Administered 2024-10-01: 1000 mL via INTRAVENOUS

## 2024-10-01 MED ORDER — KETOROLAC TROMETHAMINE 15 MG/ML IJ SOLN
15.0000 mg | Freq: Once | INTRAMUSCULAR | Status: AC
  Administered 2024-10-01: 15 mg via INTRAVENOUS
  Filled 2024-10-01: qty 1

## 2024-10-01 MED ORDER — OXYCODONE-ACETAMINOPHEN 5-325 MG PO TABS: 1.0000 | ORAL_TABLET | Freq: Four times a day (QID) | ORAL | 0 refills | Status: AC | PRN

## 2024-10-01 MED ORDER — PROMETHAZINE HCL 25 MG PO TABS: 25.0000 mg | ORAL_TABLET | Freq: Four times a day (QID) | ORAL | 0 refills | Status: AC | PRN

## 2024-10-01 MED ORDER — HYDROMORPHONE HCL 1 MG/ML IJ SOLN
1.0000 mg | Freq: Once | INTRAMUSCULAR | Status: AC
  Administered 2024-10-01: 1 mg via INTRAVENOUS
  Filled 2024-10-01: qty 1

## 2024-10-01 MED ORDER — TAMSULOSIN HCL 0.4 MG PO CAPS: 0.4000 mg | ORAL_CAPSULE | Freq: Every day | ORAL | 0 refills | Status: AC

## 2024-10-01 MED ORDER — ONDANSETRON HCL 4 MG/2ML IJ SOLN
4.0000 mg | Freq: Once | INTRAMUSCULAR | Status: AC
  Administered 2024-10-01: 4 mg via INTRAVENOUS
  Filled 2024-10-01: qty 2

## 2024-10-01 NOTE — ED Notes (Signed)
 Patient transported to CT

## 2024-10-01 NOTE — ED Provider Notes (Signed)
 " [place] EMERGENCY DEPARTMENT AT [facility] Provider Note   CSN: [id number] Arrival date & time: 09/30/24  1914     Patient presents with: Flank Pain   [name] is a 39 y.o. female.  {Add pertinent medical, surgical, social history, OB history to HPI:1929} 39 year old female presents for evaluation of flank pain.  States that started yesterday.  States is on the right side and radiates she is also having some dysuria.  States she has a history of kidney stones.  States this feels similar.  Denies any other symptoms or concerns.   Flank Pain Pertinent negatives include no chest pain, no abdominal pain and no shortness of breath.       Prior to Admission medications  Medication Sig Start Date End Date Taking? Authorizing Provider  acetaminophen  (TYLENOL ) 500 MG tablet Take 2 tablets (1,000 mg total) by mouth every 6 (six) hours as needed for moderate pain (pain score 4-6) or mild pain (pain score 1-3). 08/11/23   Henry Slough, MD  albuterol  (VENTOLIN  HFA) 108 [id number] Base) MCG/ACT inhaler Inhale 2 puffs into the lungs every 4 (four) hours as needed for shortness of breath. 03/03/22   [provider]  ALPRAZolam  (XANAX ) 1 MG tablet Take 1 tablet (1 mg total) by mouth 2 (two) times daily as needed. for anxiety 12/11/23   Mozingo, Regina Nattalie, NP  azelastine (ASTELIN) 0.1 % nasal spray Place 1 spray into both nostrils daily. Use in each nostril as directed    [provider]  diclofenac Sodium (VOLTAREN) 1 % GEL Apply 1 Application topically daily as needed for pain. 03/03/22   [provider]  diphenhydrAMINE  (BENADRYL ) 25 MG tablet Take 1 tablet (25 mg total) by mouth every 6 (six) hours as needed for itching. 09/16/22   Theadore Ozell CHRISTELLA, MD  DULoxetine  (CYMBALTA ) 60 MG capsule Take 1 capsule (60 mg total) by mouth daily. 12/11/23   Rhys Boyer T, PA-C  EPINEPHrine  0.3 mg/0.3 mL IJ SOAJ injection Inject 0.3 mg into the muscle as needed for  anaphylaxis. Patient not taking: Reported on 06/26/2023 12/26/22   Yolande Lamar BROCKS, MD  famotidine  (PEPCID ) 20 MG tablet Take 1 tablet (20 mg total) by mouth every 12 (twelve) hours as needed (rash). 09/16/22   Theadore Ozell CHRISTELLA, MD  gabapentin  (NEURONTIN ) 600 MG tablet Take 1 tablet (600 mg total) by mouth at bedtime. 12/11/23   Mozingo, Regina Nattalie, NP  hydrocortisone  cream 1 % Apply to affected area 2 times daily 05/01/23   Mannie Pac T, DO  hydrOXYzine  (ATARAX ) 10 MG tablet Take 1-2 tablets (10-20 mg total) by mouth every 8 (eight) hours as needed. Patient taking differently: Take 25 mg by mouth every 8 (eight) hours as needed. 11/29/22   Rhys Boyer DASEN, PA-C  ibuprofen  (ADVIL ) 800 MG tablet Take 1 tablet (800 mg total) by mouth every 8 (eight) hours as needed for moderate pain (pain score 4-6), cramping or mild pain (pain score 1-3) (mild pain). 08/11/23   Henry Slough, MD  VESTA 290 MCG CAPS capsule  10/01/22   [provider]  loratadine  (CLARITIN ) 10 MG tablet Take 10 mg by mouth daily.    [provider]  methocarbamol  (ROBAXIN ) 500 MG tablet Take 1 tablet (500 mg total) by mouth every 8 (eight) hours as needed for muscle spasms. 05/05/23   Gerldine Lauraine BROCKS, FNP  ondansetron  (ZOFRAN -ODT) 4 MG disintegrating tablet Take 1 tablet (4 mg total) by mouth every 8 (eight) hours  as needed for nausea or vomiting. 09/28/24   Raford Lenis, MD  oxyCODONE -acetaminophen  (PERCOCET/ROXICET) 5-325 MG tablet Take 1 tablet by mouth every 6 (six) hours as needed for severe pain (pain score 7-10). 09/28/24   Raford Lenis, MD  tamsulosin  (FLOMAX ) 0.4 MG CAPS capsule Take 1 capsule (0.4 mg total) by mouth daily. Until stone passes 09/18/24   Mesner, Selinda, MD    Allergies: Penicillins, Shrimp [shellfish allergy], Plecanatide, Amoxicillin-pot clavulanate, Clarithromycin, and Moxifloxacin    Review of Systems  Constitutional:  Negative for chills and fever.  HENT:  Negative for ear pain and  sore throat.   Eyes:  Negative for pain and visual disturbance.  Respiratory:  Negative for cough and shortness of breath.   Cardiovascular:  Negative for chest pain and palpitations.  Gastrointestinal:  Negative for abdominal pain and vomiting.  Genitourinary:  Positive for dysuria and flank pain. Negative for hematuria.  Musculoskeletal:  Negative for arthralgias and back pain.  Skin:  Negative for color change and rash.  Neurological:  Negative for seizures and syncope.  All other systems reviewed and are negative.   Updated Vital Signs BP 112/73   Pulse 86   Temp 98.6 F (37 C) (Oral)   Resp 16   Ht 5' 4 (1.626 m)   Wt 82.6 kg   LMP 09/22/2024 (Approximate)   SpO2 97%   BMI 31.24 kg/m   Physical Exam Vitals and nursing note reviewed.  Constitutional:      General: She is not in acute distress.    Appearance: Normal appearance. She is well-developed. She is not ill-appearing.  HENT:     Head: Normocephalic and atraumatic.  Eyes:     Conjunctiva/sclera: Conjunctivae normal.  Cardiovascular:     Rate and Rhythm: Normal rate and regular rhythm.     Heart sounds: No murmur heard. Pulmonary:     Effort: Pulmonary effort is normal. No respiratory distress.     Breath sounds: Normal breath sounds.  Abdominal:     Palpations: Abdomen is soft.     Tenderness: There is no abdominal tenderness. There is right CVA tenderness.  Musculoskeletal:        General: No swelling.     Cervical back: Neck supple.  Skin:    General: Skin is warm and dry.     Capillary Refill: Capillary refill takes less than 2 seconds.  Neurological:     Mental Status: She is alert.  Psychiatric:        Mood and Affect: Mood normal.     (all labs ordered are listed, but only abnormal results are displayed) Labs Reviewed  URINALYSIS, ROUTINE W REFLEX MICROSCOPIC - Abnormal; Notable for the following components:      Result Value   Hgb urine dipstick SMALL (*)    Leukocytes,Ua SMALL (*)     All other components within normal limits  BASIC METABOLIC PANEL WITH GFR  CBC WITH DIFFERENTIAL/PLATELET  POC URINE PREG, ED    EKG: None  Radiology: No results found.  {Document cardiac monitor, telemetry assessment procedure when appropriate:32947} Procedures   Medications Ordered in the ED  ondansetron  (ZOFRAN ) injection 4 mg (has no administration in time range)  ketorolac  (TORADOL ) 15 MG/ML injection 15 mg (has no administration in time range)  lactated ringers  bolus 1,000 mL (has no administration in time range)      {Click here for ABCD2, HEART and other calculators REFRESH Note before signing:1}  Medical Decision Making Amount and/or Complexity of Data Reviewed Labs: ordered. Radiology: ordered.  Risk Prescription drug management.   ***  {Document critical care time when appropriate  Document review of labs and clinical decision tools ie CHADS2VASC2, etc  Document your independent review of radiology images and any outside records  Document your discussion with family members, caretakers and with consultants  Document social determinants of health affecting pt's care  Document your decision making why or why not admission, treatments were needed:32947:::1}   Final diagnoses:  None    ED Discharge Orders     None        "

## 2024-10-01 NOTE — Discharge Instructions (Signed)
 Take your Phenergan  as needed for nausea and vomiting.  You can take your Percocet that you have at home as needed.  Take your Flomax  as prescribed.  Call your urologist tomorrow to make a follow-up appointment.  Drink lots of fluids.  You can use Tylenol  Motrin  as needed for pain.

## 2024-10-02 LAB — URINE CULTURE: Culture: 80000 — AB

## 2024-10-03 ENCOUNTER — Telehealth (HOSPITAL_BASED_OUTPATIENT_CLINIC_OR_DEPARTMENT_OTHER): Payer: Self-pay | Admitting: *Deleted

## 2024-10-03 NOTE — Telephone Encounter (Signed)
 Post ED Visit - Positive Culture Follow-up  Culture report reviewed by antimicrobial stewardship pharmacist: [facility] []  Rankin Dee, Pharm.D. []  Venetia Gully, Pharm.D., BCPS AQ-ID []  Garrel Crews, Pharm.D., BCPS []  Almarie Lunger, Pharm.D., BCPS []  [place], [place].D., BCPS, AAHIVP []  Rosaline Bihari, Pharm.D., BCPS, AAHIVP []  Vernell Meier, PharmD, BCPS []  Latanya Hint, PharmD, BCPS []  Donald Medley, PharmD, BCPS []  Rocky Bold, PharmD []  Dorothyann Alert, PharmD, BCPS [x]  Dorn Poot, PharmD  [facility] []  Rosaline Edison, PharmD []  Romona Bliss, PharmD []  Dolphus Roller, PharmD []  Veva Seip, Rph []  Vernell Daunt) Leonce, PharmD []  Eva Allis, PharmD []  Rosaline Millet, PharmD []  Iantha Batch, PharmD []  Arvin Gauss, PharmD []  Wanda Hasting, PharmD []  Ronal Rav, PharmD []  Rocky Slade, PharmD []  Bard Jeans, PharmD   Positive urine culture No further patient follow-up is required at this time per [facility], PA-C  [name] 10/03/2024, 10:47 AM
